# Patient Record
Sex: Female | Born: 1980 | Race: White | Hispanic: No | Smoking: Current every day smoker
Health system: Southern US, Community
[De-identification: ages and names within clinical notes are randomized; demographics above are authoritative.]

## PROBLEM LIST (undated history)

## (undated) ENCOUNTER — Inpatient Hospital Stay (HOSPITAL_COMMUNITY): Payer: Self-pay

## (undated) DIAGNOSIS — I493 Ventricular premature depolarization: Secondary | ICD-10-CM

## (undated) DIAGNOSIS — F99 Mental disorder, not otherwise specified: Secondary | ICD-10-CM

## (undated) DIAGNOSIS — R002 Palpitations: Secondary | ICD-10-CM

## (undated) DIAGNOSIS — E039 Hypothyroidism, unspecified: Secondary | ICD-10-CM

## (undated) DIAGNOSIS — N926 Irregular menstruation, unspecified: Secondary | ICD-10-CM

## (undated) DIAGNOSIS — R079 Chest pain, unspecified: Secondary | ICD-10-CM

## (undated) DIAGNOSIS — I38 Endocarditis, valve unspecified: Secondary | ICD-10-CM

## (undated) DIAGNOSIS — B009 Herpesviral infection, unspecified: Secondary | ICD-10-CM

## (undated) HISTORY — DX: Palpitations: R00.2

## (undated) HISTORY — DX: Chest pain, unspecified: R07.9

## (undated) HISTORY — DX: Irregular menstruation, unspecified: N92.6

## (undated) HISTORY — DX: Mental disorder, not otherwise specified: F99

## (undated) HISTORY — DX: Ventricular premature depolarization: I49.3

## (undated) HISTORY — PX: WISDOM TOOTH EXTRACTION: SHX21

## (undated) HISTORY — DX: Herpesviral infection, unspecified: B00.9

---

## 2012-07-01 ENCOUNTER — Emergency Department (HOSPITAL_COMMUNITY): Payer: Self-pay

## 2012-07-01 ENCOUNTER — Emergency Department (HOSPITAL_COMMUNITY)
Admission: EM | Admit: 2012-07-01 | Discharge: 2012-07-01 | Disposition: A | Discharge status: Home / Self Care | Payer: Self-pay | Attending: Emergency Medicine | Admitting: Emergency Medicine

## 2012-07-01 ENCOUNTER — Other Ambulatory Visit: Payer: Self-pay

## 2012-07-01 ENCOUNTER — Encounter (HOSPITAL_COMMUNITY): Payer: Self-pay

## 2012-07-01 DIAGNOSIS — R001 Bradycardia, unspecified: Secondary | ICD-10-CM

## 2012-07-01 DIAGNOSIS — Z79899 Other long term (current) drug therapy: Secondary | ICD-10-CM | POA: Insufficient documentation

## 2012-07-01 DIAGNOSIS — E119 Type 2 diabetes mellitus without complications: Secondary | ICD-10-CM | POA: Insufficient documentation

## 2012-07-01 DIAGNOSIS — I498 Other specified cardiac arrhythmias: Secondary | ICD-10-CM | POA: Insufficient documentation

## 2012-07-01 DIAGNOSIS — E079 Disorder of thyroid, unspecified: Secondary | ICD-10-CM | POA: Insufficient documentation

## 2012-07-01 DIAGNOSIS — I951 Orthostatic hypotension: Secondary | ICD-10-CM | POA: Insufficient documentation

## 2012-07-01 LAB — COMPREHENSIVE METABOLIC PANEL
ALT: 10 U/L (ref 0–35)
AST: 13 U/L (ref 0–37)
Albumin: 3.5 g/dL (ref 3.5–5.2)
Alkaline Phosphatase: 40 U/L (ref 39–117)
CO2: 26 mEq/L (ref 19–32)
Chloride: 105 mEq/L (ref 96–112)
GFR calc non Af Amer: >90 mL/min (ref >90)
Potassium: 3.9 mEq/L (ref 3.5–5.1)
Sodium: 138 mEq/L (ref 135–145)
Total Bilirubin: 0.2 mg/dL — ABNORMAL LOW (ref 0.3–1.2)

## 2012-07-01 LAB — URINALYSIS, ROUTINE W REFLEX MICROSCOPIC
Glucose, UA: NEGATIVE mg/dL
Hgb urine dipstick: NEGATIVE
Leukocytes, UA: NEGATIVE
Protein, ur: NEGATIVE mg/dL
Specific Gravity, Urine: <1.005 — ABNORMAL LOW (ref 1.005–1.030)

## 2012-07-01 LAB — CBC WITH DIFFERENTIAL/PLATELET
Basophils Absolute: 0 10*3/uL (ref 0.0–0.1)
Basophils Relative: 0 % (ref 0–1)
HCT: 34 % — ABNORMAL LOW (ref 36.0–46.0)
Lymphocytes Relative: 33 % (ref 12–46)
Monocytes Absolute: 0.4 10*3/uL (ref 0.1–1.0)
Neutro Abs: 4.1 10*3/uL (ref 1.7–7.7)
Neutrophils Relative %: 60 % (ref 43–77)
RDW: 13.2 % (ref 11.5–15.5)
WBC: 6.8 10*3/uL (ref 4.0–10.5)

## 2012-07-01 LAB — PREGNANCY, URINE: Preg Test, Ur: NEGATIVE

## 2012-07-01 MED ORDER — PROMETHAZINE HCL 25 MG PO TABS: 25.0000 mg | ORAL_TABLET | Freq: Four times a day (QID) | ORAL | Status: DC | PRN

## 2012-07-01 MED ORDER — SODIUM CHLORIDE 0.9 % IV SOLN: 1000.0000 mL | INTRAVENOUS | Status: DC

## 2012-07-01 MED ORDER — SODIUM CHLORIDE 0.9 % IV SOLN
1000.0000 mL | Freq: Once | INTRAVENOUS | Status: AC
  Administered 2012-07-01: 1000 mL via INTRAVENOUS

## 2012-07-01 NOTE — ED Notes (Addendum)
Pt states she has been more tired than usual and she thought she was going to pass out. Pt states her "chest felt weird". Pt was seen at health dept and had EKG done and was then told to see a cardiologist. Pt denies CP and SOB.

## 2012-07-01 NOTE — ED Provider Notes (Signed)
History     CSN: [id number]  Arrival date & time 07/01/12  1704   First MD Initiated Contact with Patient 07/01/12 1718      Chief Complaint  Patient presents with  . Bradycardia    (Consider location/radiation/quality/duration/timing/severity/associated sxs/prior treatment) HPI  Patient reports for the past month she's been feeling tired, weak and dizzy. She states the dizziness is not spinning but feeling she might pass out. 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 She states her chest felt "weird" and states it was being irregularly. She continues to feel tired. She states the symptoms lasted about 45 minutes. She denies chest pain, shortness of breath, diaphoresis, spinning feeling, headache, nausea, or vomiting. She states her symptoms are worse when she stands up quickly, nothing makes it feel better. She states she has had a normal appetite. She was seen today at the health department and was told to come to the ED or be referred to a cardiologist. They said lab work from August 1 showed her A1c is 6, her TSH was 4.239.  Patient reports her last normal period was about a month ago she was recently swapped on her birth control pills, she states she did have some spotting.  PCP [facility]  Past Medical History  Diagnosis Date  . Thyroid disease since age 36   . Diabetes mellitus about 2 years     History reviewed. No pertinent past surgical history.  No family history on file. Maternal grandmother had coronary artery disease and died at age 29 One maternal aunt died at age 20 from congestive heart failure Another maternal aunt died of congestive heart failure at age 16 Thyroid disease runs in the family Parents are fine  History  Substance Use Topics  . Smoking status: Never Smoker   . Smokeless  tobacco: Not on file  . Alcohol Use: No   employed  OB History    Grav Para Term Preterm Abortions TAB SAB Ect Mult Living                  Review of Systems  All other systems reviewed and are negative.    Allergies  Review of patient's allergies indicates no known allergies.  Home Medications   Current Outpatient Rx  Name Route Sig Dispense Refill  . OMEGA-3 FATTY ACIDS 1000 MG PO CAPS Oral Take 1 g by mouth 2 (two) times daily.    Marland Kitchen LEVONORGEST-ETH ESTRAD 91-DAY 0.15-0.03 MG PO TABS Oral Take 1 tablet by mouth daily.    Marland Kitchen LEVOTHYROXINE SODIUM 50 MCG PO TABS Oral Take 50 mcg by mouth daily.    Marland Kitchen METFORMIN HCL ER 500 MG PO TB24 Oral Take 500 mg by mouth daily.    . ADULT MULTIVITAMIN W/MINERALS CH Oral Take 1 tablet by mouth daily.      BP 108/49  Pulse 59  Temp 98.8 F (37.1 C) (Oral)  Resp 18  Ht 5\' 8"  (1.727 m)  Wt 188 lb (85.276 kg)  BMI 28.59 kg/m2  SpO2 100%  Vital signs normal   Orthostatic vital signs are positive for hypertension and mild bradycardia to 53  Physical Exam  Nursing note and vitals reviewed. Constitutional: She is oriented to person, place, and time. She appears well-developed and well-nourished.  Non-toxic appearance. She does  not appear ill. No distress.  HENT:  Head: Normocephalic and atraumatic.  Right Ear: External ear normal.  Left Ear: External ear normal.  Nose: Nose normal. No mucosal edema or rhinorrhea.  Mouth/Throat: Oropharynx is clear and moist and mucous membranes are normal. No dental abscesses or uvula swelling.  Eyes: Conjunctivae normal and EOM are normal. Pupils are equal, round, and reactive to light.  Neck: Normal range of motion and full passive range of motion without pain. Neck supple.  Cardiovascular: Normal rate, regular rhythm and normal heart sounds.  Exam reveals no gallop and no friction rub.   No murmur heard. Pulmonary/Chest: Effort normal and breath sounds normal. No respiratory distress. She has no  wheezes. She has no rhonchi. She has no rales. She exhibits no tenderness and no crepitus.  Abdominal: Soft. Normal appearance and bowel sounds are normal. She exhibits no distension. There is no tenderness. There is no rebound and no guarding.  Musculoskeletal: Normal range of motion. She exhibits no edema and no tenderness.       Moves all extremities well.   Neurological: She is alert and oriented to person, place, and time. She has normal strength. No cranial nerve deficit.  Skin: Skin is warm, dry and intact. No rash noted. No erythema. No pallor.  Psychiatric: She has a normal mood and affect. Her speech is normal and behavior is normal. Her mood appears not anxious.    ED Course  Procedures (including critical care time)  Medications  0.9 %  sodium chloride infusion (1000 mL Intravenous New Bag/Given 07/01/12 1954)    Followed by  0.9 %  sodium chloride infusion (not administered)  promethazine (PHENERGAN) 25 MG tablet (not administered)   19:30 pt given test results and need for IV fluids.  21:15 Pt has only gotten 200 cc of her bolus, she has her arm bent and IV isn't running, pt shown how to keep her arm extended so she can finish her bolus of fluids.   Pt received her bolus of fluids. Orthostatic hypotension has improved.   Results for orders placed during the hospital encounter of 07/01/12  CBC WITH DIFFERENTIAL      Component Value Range   WBC 6.8  4.0 - 10.5 K/uL   RBC 3.76 (*) 3.87 - 5.11 MIL/uL   Hemoglobin 11.5 (*) 12.0 - 15.0 g/dL   HCT 96.0 (*) 45.4 - 09.8 %   MCV 90.4  78.0 - 100.0 fL   MCH 30.6  26.0 - 34.0 pg   MCHC 33.8  30.0 - 36.0 g/dL   RDW 11.9  14.7 - 82.9 %   Platelets 266  150 - 400 K/uL   Neutrophils Relative 60  43 - 77 %   Neutro Abs 4.1  1.7 - 7.7 K/uL   Lymphocytes Relative 33  12 - 46 %   Lymphs Abs 2.3  0.7 - 4.0 K/uL   Monocytes Relative 6  3 - 12 %   Monocytes Absolute 0.4  0.1 - 1.0 K/uL   Eosinophils Relative 1  0 - 5 %   Eosinophils  Absolute 0.1  0.0 - 0.7 K/uL   Basophils Relative 0  0 - 1 %   Basophils Absolute 0.0  0.0 - 0.1 K/uL  COMPREHENSIVE METABOLIC PANEL      Component Value Range   Sodium 138  135 - 145 mEq/L   Potassium 3.9  3.5 - 5.1 mEq/L   Chloride 105  96 - 112 mEq/L   CO2  26  19 - 32 mEq/L   Glucose, Bld 103 (*) 70 - 99 mg/dL   BUN 11  6 - 23 mg/dL   Creatinine, Ser 1.61  0.50 - 1.10 mg/dL   Calcium 9.0  8.4 - 09.6 mg/dL   Total Protein 6.9  6.0 - 8.3 g/dL   Albumin 3.5  3.5 - 5.2 g/dL   AST 13  0 - 37 U/L   ALT 10  0 - 35 U/L   Alkaline Phosphatase 40  39 - 117 U/L   Total Bilirubin 0.2 (*) 0.3 - 1.2 mg/dL   GFR calc non Af Amer >90  >90 mL/min   GFR calc Af Amer >90  >90 mL/min  TROPONIN I      Component Value Range   Troponin I <0.30  <0.30 ng/mL  MONONUCLEOSIS SCREEN      Component Value Range   Mono Screen NEGATIVE  NEGATIVE  URINALYSIS, ROUTINE W REFLEX MICROSCOPIC      Component Value Range   Color, Urine YELLOW  YELLOW   APPearance CLEAR  CLEAR   Specific Gravity, Urine <1.005 (*) 1.005 - 1.030   pH 5.0  5.0 - 8.0   Glucose, UA NEGATIVE  NEGATIVE mg/dL   Hgb urine dipstick NEGATIVE  NEGATIVE   Bilirubin Urine NEGATIVE  NEGATIVE   Ketones, ur NEGATIVE  NEGATIVE mg/dL   Protein, ur NEGATIVE  NEGATIVE mg/dL   Urobilinogen, UA 0.2  0.0 - 1.0 mg/dL   Nitrite NEGATIVE  NEGATIVE   Leukocytes, UA NEGATIVE  NEGATIVE  PREGNANCY, URINE      Component Value Range   Preg Test, Ur NEGATIVE  NEGATIVE   Laboratory interpretation all normal except mild anemia  Dg Chest 2 View  07/01/2012  *RADIOLOGY REPORT*  Clinical Data: 31 year old female bradycardia, weakness.  CHEST - 2 VIEW  Comparison: 12/11/2009.  Findings: Lung volumes within normal limits.  Cardiac size and mediastinal contours are within normal limits.  Visualized tracheal air column is within normal limits.  The lungs are clear.  No pneumothorax or effusion. No acute osseous abnormality identified.  IMPRESSION: Negative, no  acute cardiopulmonary abnormality.   Original Report Authenticated By: Harley Hallmark, M.D.      Date: 07/01/2012  Rate: 62  Rhythm: sinus bradycardia and premature ventricular contractions (PVC)  QRS Axis: normal  Intervals: normal  ST/T Wave abnormalities: normal  Conduction Disutrbances:none  Narrative Interpretation:   Old EKG Reviewed: none available    1. Orthostatic hypotension   2. Bradycardia     New Prescriptions   PROMETHAZINE (PHENERGAN) 25 MG TABLET    Take 1 tablet (25 mg total) by mouth every 6 (six) hours as needed for nausea.    Plan discharge  Devoria Albe, MD, FACEP   MDM          Ward Givens, MD 07/01/12 2207

## 2012-07-01 NOTE — ED Notes (Signed)
Pt reports for the past month has felt tired, has had intermittent chest pain, and feeling light headed.  [facility] to [facility] and had ekg.   Was instructed to see a cardiologist but pt doesn't have insurance.

## 2012-07-14 ENCOUNTER — Emergency Department (HOSPITAL_COMMUNITY)
Admission: EM | Admit: 2012-07-14 | Discharge: 2012-07-14 | Disposition: A | Discharge status: Home / Self Care | Payer: Self-pay | Attending: Emergency Medicine | Admitting: Emergency Medicine

## 2012-07-14 ENCOUNTER — Encounter (HOSPITAL_COMMUNITY): Payer: Self-pay | Admitting: *Deleted

## 2012-07-14 DIAGNOSIS — E039 Hypothyroidism, unspecified: Secondary | ICD-10-CM | POA: Insufficient documentation

## 2012-07-14 DIAGNOSIS — I498 Other specified cardiac arrhythmias: Secondary | ICD-10-CM | POA: Insufficient documentation

## 2012-07-14 DIAGNOSIS — R55 Syncope and collapse: Secondary | ICD-10-CM

## 2012-07-14 DIAGNOSIS — E119 Type 2 diabetes mellitus without complications: Secondary | ICD-10-CM | POA: Insufficient documentation

## 2012-07-14 DIAGNOSIS — I4949 Other premature depolarization: Secondary | ICD-10-CM | POA: Insufficient documentation

## 2012-07-14 DIAGNOSIS — I493 Ventricular premature depolarization: Secondary | ICD-10-CM

## 2012-07-14 LAB — BASIC METABOLIC PANEL
CO2: 23 mEq/L (ref 19–32)
Chloride: 105 mEq/L (ref 96–112)
GFR calc Af Amer: >90 mL/min (ref >90)
Potassium: 3.3 mEq/L — ABNORMAL LOW (ref 3.5–5.1)

## 2012-07-14 LAB — CBC WITH DIFFERENTIAL/PLATELET
Basophils Absolute: 0 10*3/uL (ref 0.0–0.1)
Basophils Relative: 1 % (ref 0–1)
HCT: 33.6 % — ABNORMAL LOW (ref 36.0–46.0)
Hemoglobin: 11.1 g/dL — ABNORMAL LOW (ref 12.0–15.0)
Lymphocytes Relative: 40 % (ref 12–46)
MCHC: 33 g/dL (ref 30.0–36.0)
Monocytes Absolute: 0.4 10*3/uL (ref 0.1–1.0)
Monocytes Relative: 7 % (ref 3–12)
Neutro Abs: 3.3 10*3/uL (ref 1.7–7.7)
Neutrophils Relative %: 52 % (ref 43–77)
RDW: 12.8 % (ref 11.5–15.5)
WBC: 6.2 10*3/uL (ref 4.0–10.5)

## 2012-07-14 LAB — URINALYSIS, ROUTINE W REFLEX MICROSCOPIC
Glucose, UA: NEGATIVE mg/dL
Nitrite: NEGATIVE
Specific Gravity, Urine: <1.005 — ABNORMAL LOW (ref 1.005–1.030)
pH: 6 (ref 5.0–8.0)

## 2012-07-14 LAB — URINE MICROSCOPIC-ADD ON

## 2012-07-14 MED ORDER — SODIUM CHLORIDE 0.9 % IV BOLUS (SEPSIS)
1000.0000 mL | Freq: Once | INTRAVENOUS | Status: AC
  Administered 2012-07-14: 1000 mL via INTRAVENOUS

## 2012-07-14 NOTE — ED Provider Notes (Signed)
History   Scribed for [employer]. Bernette Mayers, MD, the patient was seen in room APA05/APA05 . This chart was scribed by Lewanda Rife.   CSN: [id number]  Arrival date & time 07/14/12  1508   First MD Initiated Contact with Patient 07/14/12 1533      Chief Complaint  Patient presents with  . Irregular Heart Beat    (Consider location/radiation/quality/duration/timing/severity/associated sxs/prior treatment) HPI Miranda [name] is a 31 y.o. female who presents to the Emergency Department complaining of an irregular heart beat today since work earlier today. Pt reports feeling lightheaded and like she was going to "pass out" but never lost consciousness Pt reports having her thyroid levels and A1C checked recently [facility], which came back normal. Pt denies fever, nausea, emesis, vaginal bleeding, and rectal bleeding. Pt was seen in the ED for same about a week ago, had PVC but otherwise normal labs. Was given IVF for orthostatic vitals and felt better. Pt reports she has an appointment with a cardiologist at [facility] coming up in a few days. Additionally, pt feels like she has urinary tract infection. Pt denies smoking. Pt denies taking any medications to treat symptoms.   Past Medical History  Diagnosis Date  . Thyroid disease   . Diabetes mellitus     History reviewed. No pertinent past surgical history.  History reviewed. No pertinent family history.  History  Substance Use Topics  . Smoking status: Never Smoker   . Smokeless tobacco: Not on file  . Alcohol Use: No    OB History    Grav Para Term Preterm Abortions TAB SAB Ect Mult Living                  Review of Systems A complete 10 system review of systems was obtained and all systems are negative except as noted in the HPI and PMH.   Allergies  Review of patient's allergies indicates no known allergies.  Home Medications   Current Outpatient Rx  Name Route Sig Dispense Refill  . OMEGA-3 FATTY ACIDS 1000 MG  PO CAPS Oral Take 1 g by mouth 2 (two) times daily.    Marland Kitchen LEVONORGEST-ETH ESTRAD 91-DAY 0.15-0.03 MG PO TABS Oral Take 1 tablet by mouth daily.    Marland Kitchen LEVOTHYROXINE SODIUM 50 MCG PO TABS Oral Take 50 mcg by mouth daily.    Marland Kitchen METFORMIN HCL ER 500 MG PO TB24 Oral Take 500 mg by mouth daily.    . ADULT MULTIVITAMIN W/MINERALS CH Oral Take 1 tablet by mouth daily.    Marland Kitchen CRANBERRY URINARY COMFORT PO Oral Take 2 tablets by mouth 3 (three) times daily after meals.      BP 113/75  Pulse 57  Temp 99.3 F (37.4 C) (Oral)  Resp 18  Ht 5\' 8"  (1.727 m)  Wt 188 lb (85.276 kg)  BMI 28.59 kg/m2  SpO2 100%  LMP 06/10/2012  Physical Exam  Nursing note and vitals reviewed. Constitutional: She is oriented to person, place, and time. She appears well-developed and well-nourished.  HENT:  Head: Normocephalic and atraumatic.  Eyes: EOM are normal. Pupils are equal, round, and reactive to light.  Neck: Normal range of motion. Neck supple.  Cardiovascular: Normal rate, normal heart sounds and intact distal pulses.   Pulmonary/Chest: Effort normal and breath sounds normal.  Abdominal: Bowel sounds are normal. She exhibits no distension. There is no tenderness.  Musculoskeletal: Normal range of motion. She exhibits no edema and no tenderness.  Neurological: She is  alert and oriented to person, place, and time. She has normal strength. No cranial nerve deficit or sensory deficit.  Skin: Skin is warm and dry. No rash noted.  Psychiatric: She has a normal mood and affect.    ED Course  Procedures (including critical care time)  Labs Reviewed  CBC WITH DIFFERENTIAL - Abnormal; Notable for the following:    RBC 3.75 (*)     Hemoglobin 11.1 (*)     HCT 33.6 (*)     All other components within normal limits  BASIC METABOLIC PANEL - Abnormal; Notable for the following:    Potassium 3.3 (*)     All other components within normal limits  URINALYSIS, ROUTINE W REFLEX MICROSCOPIC - Abnormal; Notable for the  following:    Specific Gravity, Urine <1.005 (*)     Leukocytes, UA SMALL (*)     All other components within normal limits  URINE MICROSCOPIC-ADD ON - Abnormal; Notable for the following:    Squamous Epithelial / LPF MANY (*)     Bacteria, UA FEW (*)     All other components within normal limits  PREGNANCY, URINE   No results found.   No diagnosis found.    MDM   Date: 07/14/2012  Rate: 57  Rhythm: sinus bradycardia and premature ventricular contractions (PVC)  QRS Axis: normal  Intervals: normal  ST/T Wave abnormalities: normal  Conduction Disutrbances:none  Narrative Interpretation:   Old EKG Reviewed: unchanged    5:39 PM Pt feeling better, labs unchanged from previous. I spoke with [facility] clinic, but no openings prior to her visit on 10/11. Pt advised to follow up with Cards for further eval. No significant abnormalities here except for occasional PVCs.     I personally performed the services described in the documentation, which were scribed in my presence. The recorded information has been reviewed and considered.     Charles B. Bernette Mayers, MD 07/14/12 1740

## 2012-07-14 NOTE — ED Notes (Addendum)
Onset 1 hour ago at work, sob, chest felt "weird"  And felt faint.  Says she thinks she has a uti also, taking otc meds for sx.

## 2012-07-24 ENCOUNTER — Ambulatory Visit (HOSPITAL_COMMUNITY)
Admission: RE | Admit: 2012-07-24 | Discharge: 2012-07-24 | Disposition: A | Discharge status: Home / Self Care | Payer: Self-pay | Source: Ambulatory Visit | Attending: Internal Medicine | Admitting: Internal Medicine

## 2012-07-24 ENCOUNTER — Other Ambulatory Visit (HOSPITAL_COMMUNITY): Payer: Self-pay

## 2012-07-24 DIAGNOSIS — I493 Ventricular premature depolarization: Secondary | ICD-10-CM

## 2012-07-24 DIAGNOSIS — R079 Chest pain, unspecified: Secondary | ICD-10-CM

## 2012-07-24 DIAGNOSIS — R002 Palpitations: Secondary | ICD-10-CM | POA: Insufficient documentation

## 2012-07-24 HISTORY — DX: Chest pain, unspecified: R07.9

## 2012-07-24 HISTORY — DX: Ventricular premature depolarization: I49.3

## 2012-07-24 NOTE — CV Procedure (Signed)
Miranda [name] presented today for her treadmill stress test. She exercised under my supervision on a treadmill for 11 minutes and 30 seconds. THR of 159 was achieved. Baseline EKG showed sinus rhythm with no ischemic changes. There were isolated PVC's at rest and during exercise which diminished with exercise. No exercise induced ischemic EKG changes were noted. Blood pressure response was normal. This is a normal Bruce treadmill stress test predicting a low likelihood of ischemic events.  Chrystie Nose, MD, [facility] Attending Cardiologist [facility]

## 2012-07-24 NOTE — Progress Notes (Signed)
Stress Lab Nurses Notes - Jeani Hawking  Rayne Cowdrey 07/24/2012 Reason for doing test: Palpitation & presyncope Type of test: Regular GTX Nurse performing test: Parke Poisson, RN Nuclear Medicine Tech: Not Applicable Echo Tech: Not Applicable MD performing test: Ellene Route Family MD: [facility] Test explained and consent signed: yes IV started: No IV started Symptoms: light SOB & fatigue Treatment/Intervention: None Reason test stopped: fatigue After recovery IV was: None Patient to return to Nuc. Med at : NA Patient discharged: Home Patient's Condition upon discharge was: stable Comments: During test peak BP 178/98 & HR 162.  Recovery BP 132/82 & HR 68.  Symptoms resolved in recovery. Erskine Speed T

## 2012-07-24 NOTE — Progress Notes (Signed)
*  PRELIMINARY RESULTS* Echocardiogram 2D Echocardiogram has been performed.  Conrad Bonduel 07/24/2012, 2:10 PM

## 2012-10-08 NOTE — L&D Delivery Note (Signed)
Operative Delivery Note At 9:09 AM a viable female was delivered via vacuum assist.  Presentation: vertex; Position: Occiput,, Anterior; Station: +3.  Verbal consent: obtained from patient.  Risks and benefits discussed in detail.  Risks include, but are not limited to the risks of bleeding, infection, damage to maternal tissues, fetal cephalhematoma.  There is also the risk of inability to effect vaginal delivery of the head, or shoulder dystocia that cannot be resolved by established maneuvers, leading to the need for emergency cesarean section.  APGAR: Pending, infant crying on maternal abdomen,; weight .   Placenta status: Intact,  Cord: 3V with the following complications: .  Cord pH: NA  Anesthesia:  Epidural already in place Instruments: Kiwi vacuum Episiotomy:  NA Lacerations:  Bilateral hemostatic labial, sulcal tear Suture Repair: 3.0 vicryl on CT Est. Blood Loss (mL):   Mom to postpartum.  Baby to maternal chest.  Mother had been pushing for 3 hrs and was concerned for maternal exhaustion. Discussed risks and benefits and agreed on trial of Vacuum prior to c-section. Kiwi placed on occiput, 3 pulls with one set of contraction. Infant head delivered easily. Vacuum removed once chin past perineum. Mother continued to push with easy delivery of shoulders. Baby delivered to maternal abdomen. Vaginal inspection showed intact perineum, bil labial tears hemostatic not requiring repair. Small sulcal tear bleeding, repaired with 3.0 vicrul on CT with 3 locking stitches. Hemostatic. EBL 300cc. Counts correct, sharps off field. VAVD over intact perineum.  Ramello Cordial RYAN 07/26/2013, 9:30 AM

## 2012-12-03 ENCOUNTER — Emergency Department (HOSPITAL_COMMUNITY)
Admission: EM | Admit: 2012-12-03 | Discharge: 2012-12-03 | Disposition: A | Discharge status: Home / Self Care | Payer: Medicaid Other | Attending: Emergency Medicine | Admitting: Emergency Medicine

## 2012-12-03 ENCOUNTER — Encounter (HOSPITAL_COMMUNITY): Payer: Self-pay | Admitting: Emergency Medicine

## 2012-12-03 DIAGNOSIS — R109 Unspecified abdominal pain: Secondary | ICD-10-CM | POA: Insufficient documentation

## 2012-12-03 DIAGNOSIS — N390 Urinary tract infection, site not specified: Secondary | ICD-10-CM | POA: Insufficient documentation

## 2012-12-03 DIAGNOSIS — R110 Nausea: Secondary | ICD-10-CM | POA: Insufficient documentation

## 2012-12-03 DIAGNOSIS — R3915 Urgency of urination: Secondary | ICD-10-CM | POA: Insufficient documentation

## 2012-12-03 DIAGNOSIS — R350 Frequency of micturition: Secondary | ICD-10-CM | POA: Insufficient documentation

## 2012-12-03 DIAGNOSIS — Z3201 Encounter for pregnancy test, result positive: Secondary | ICD-10-CM | POA: Insufficient documentation

## 2012-12-03 DIAGNOSIS — E119 Type 2 diabetes mellitus without complications: Secondary | ICD-10-CM | POA: Insufficient documentation

## 2012-12-03 DIAGNOSIS — O2341 Unspecified infection of urinary tract in pregnancy, first trimester: Secondary | ICD-10-CM

## 2012-12-03 DIAGNOSIS — Z79899 Other long term (current) drug therapy: Secondary | ICD-10-CM | POA: Insufficient documentation

## 2012-12-03 DIAGNOSIS — Z331 Pregnant state, incidental: Secondary | ICD-10-CM | POA: Insufficient documentation

## 2012-12-03 DIAGNOSIS — R6883 Chills (without fever): Secondary | ICD-10-CM | POA: Insufficient documentation

## 2012-12-03 DIAGNOSIS — K5900 Constipation, unspecified: Secondary | ICD-10-CM | POA: Insufficient documentation

## 2012-12-03 DIAGNOSIS — E079 Disorder of thyroid, unspecified: Secondary | ICD-10-CM | POA: Insufficient documentation

## 2012-12-03 LAB — URINALYSIS, ROUTINE W REFLEX MICROSCOPIC
Glucose, UA: NEGATIVE mg/dL
Leukocytes, UA: NEGATIVE
Nitrite: POSITIVE — AB
Specific Gravity, Urine: <1.005 — ABNORMAL LOW (ref 1.005–1.030)
pH: 6 (ref 5.0–8.0)

## 2012-12-03 LAB — POCT PREGNANCY, URINE: Preg Test, Ur: POSITIVE — AB

## 2012-12-03 MED ORDER — CEPHALEXIN 500 MG PO CAPS: 500.0000 mg | ORAL_CAPSULE | Freq: Four times a day (QID) | ORAL | Status: DC

## 2012-12-03 MED ORDER — CEPHALEXIN 500 MG PO CAPS
ORAL_CAPSULE | ORAL | Status: AC
  Filled 2012-12-03: qty 1

## 2012-12-03 MED ORDER — ONDANSETRON 8 MG PO TBDP
8.0000 mg | ORAL_TABLET | Freq: Once | ORAL | Status: AC
  Administered 2012-12-03: 8 mg via ORAL
  Filled 2012-12-03: qty 1

## 2012-12-03 MED ORDER — CEPHALEXIN 500 MG PO CAPS
500.0000 mg | ORAL_CAPSULE | Freq: Once | ORAL | Status: AC
  Administered 2012-12-03: 500 mg via ORAL

## 2012-12-03 MED ORDER — CEPHALEXIN 250 MG PO CAPS
250.0000 mg | ORAL_CAPSULE | Freq: Once | ORAL | Status: DC
  Filled 2012-12-03: qty 1

## 2012-12-03 NOTE — ED Provider Notes (Signed)
Medical screening examination/treatment/procedure(s) were performed by non-physician practitioner and as supervising physician I was immediately available for consultation/collaboration.   Gavin Pound. Dacie Mandel, MD 12/03/12 2956

## 2012-12-03 NOTE — ED Notes (Signed)
Pt c/o dysuria and polyuria x1 week.

## 2012-12-03 NOTE — ED Provider Notes (Signed)
History     CSN: [id number]  Arrival date & time 12/03/12  2127   First MD Initiated Contact with Patient 12/03/12 2204      Chief Complaint  Patient presents with  . Dysuria    (Consider location/radiation/quality/duration/timing/severity/associated sxs/prior Treatment)  HPI Miranda [name] is a 32 y.o. female who presents to the ED with UTI symptoms. Onset 1 week ago. Symptoms include frequent urination, urgency, dysuria and nausea. Has had UTI's in the past and this feels similar. Has been taking AZO over the counter without relief. Denies vaginal discharge or bleeding. Unsure LMP, thinks 2 months ago. No birth control. Denies abdominal pain.  The history was provided by the patient.   Past Medical History  Diagnosis Date  . Thyroid disease   . Diabetes mellitus     History reviewed. No pertinent past surgical history.  History reviewed. No pertinent family history.  History  Substance Use Topics  . Smoking status: Never Smoker   . Smokeless tobacco: Not on file  . Alcohol Use: No    OB History   Grav Para Term Preterm Abortions TAB SAB Ect Mult Living                  Review of Systems  Constitutional: Positive for chills. Negative for fever, diaphoresis and fatigue.  HENT: Negative for ear pain, congestion, sore throat, facial swelling, neck pain, neck stiffness, dental problem and sinus pressure.   Eyes: Negative for photophobia, pain and discharge.  Respiratory: Negative for cough, chest tightness and wheezing.   Cardiovascular: Negative for chest pain and palpitations.  Gastrointestinal: Positive for nausea, abdominal pain and constipation. Negative for vomiting, diarrhea and abdominal distention.  Genitourinary: Positive for dysuria, urgency and frequency. Negative for flank pain, vaginal bleeding, vaginal discharge, difficulty urinating and vaginal pain.  Musculoskeletal: Negative for myalgias, back pain and gait problem.  Skin: Negative for color change and  rash.  Allergic/Immunologic: Negative for immunocompromised state.  Neurological: Negative for dizziness, speech difficulty, weakness, light-headedness, numbness and headaches.  Psychiatric/Behavioral: Negative for confusion and agitation. The patient is not nervous/anxious.     Allergies  Review of patient's allergies indicates no known allergies.  Home Medications   Current Outpatient Rx  Name  Route  Sig  Dispense  Refill  . levothyroxine (SYNTHROID, LEVOTHROID) 50 MCG tablet   Oral   Take 50 mcg by mouth daily.         . metFORMIN (GLUCOPHAGE-XR) 500 MG 24 hr tablet   Oral   Take 500 mg by mouth daily.         . Multiple Vitamin (MULTIVITAMIN WITH MINERALS) TABS   Oral   Take 1 tablet by mouth daily.           BP 131/82  Pulse 72  Temp(Src) 98 F (36.7 C) (Oral)  Resp 18  SpO2 100%  Physical Exam  Nursing note and vitals reviewed. Constitutional: She is oriented to person, place, and time. She appears well-developed and well-nourished.  HENT:  Head: Normocephalic and atraumatic.  Eyes: EOM are normal. Pupils are equal, round, and reactive to light.  Neck: Neck supple.  Cardiovascular: Normal rate.   Pulmonary/Chest: Effort normal. No respiratory distress.  Abdominal: Soft. There is no tenderness. There is no CVA tenderness.  Genitourinary:  Patient declined pelvic exam.  Musculoskeletal: Normal range of motion. She exhibits no edema.  Neurological: She is alert and oriented to person, place, and time. No cranial nerve deficit.  Skin: Skin  is warm and dry.  Psychiatric: She has a normal mood and affect. Her behavior is normal. Judgment and thought content normal.    ED Course  Procedures  Results for orders placed during the hospital encounter of 12/03/12 (from the past 24 hour(s))  URINALYSIS, ROUTINE W REFLEX MICROSCOPIC     Status: Abnormal   Collection Time    12/03/12 10:31 PM      Result Value Range   Color, Urine ORANGE (*) YELLOW    APPearance CLEAR  CLEAR   Specific Gravity, Urine <1.005 (*) 1.005 - 1.030   pH 6.0  5.0 - 8.0   Glucose, UA NEGATIVE  NEGATIVE mg/dL   Hgb urine dipstick NEGATIVE  NEGATIVE   Bilirubin Urine NEGATIVE  NEGATIVE   Ketones, ur NEGATIVE  NEGATIVE mg/dL   Protein, ur NEGATIVE  NEGATIVE mg/dL   Urobilinogen, UA 1.0  0.0 - 1.0 mg/dL   Nitrite POSITIVE (*) NEGATIVE   Leukocytes, UA NEGATIVE  NEGATIVE  URINE MICROSCOPIC-ADD ON     Status: None   Collection Time    12/03/12 10:31 PM      Result Value Range   Bacteria, UA RARE  RARE  POCT PREGNANCY, URINE     Status: Abnormal   Collection Time    12/03/12 10:39 PM      Result Value Range   Preg Test, Ur POSITIVE (*) NEGATIVE     Assessment: 32 y.o. female with UTI in pregnancy  Plan:  Treat UTI   Start prenatal care, return as needed Discussed with the patient and all questioned fully answered.    Medication List    TAKE these medications       cephALEXin 500 MG capsule  Commonly known as:  KEFLEX  Take 1 capsule (500 mg total) by mouth 4 (four) times daily.      ASK your doctor about these medications       levothyroxine 50 MCG tablet  Commonly known as:  SYNTHROID, LEVOTHROID  Take 50 mcg by mouth daily.     metFORMIN 500 MG 24 hr tablet  Commonly known as:  GLUCOPHAGE-XR  Take 500 mg by mouth daily.     multivitamin with minerals Tabs  Take 1 tablet by mouth daily.           [place], [place] 12/03/12 [phone]

## 2012-12-18 ENCOUNTER — Other Ambulatory Visit: Payer: Self-pay | Admitting: Adult Health

## 2012-12-18 LAB — OB RESULTS CONSOLE RPR
RPR: NONREACTIVE
RPR: NONREACTIVE

## 2012-12-18 LAB — OB RESULTS CONSOLE HGB/HCT, BLOOD
HCT: 34 %
Hemoglobin: 11.6 g/dL

## 2012-12-18 LAB — OB RESULTS CONSOLE TSH: TSH: 5.67

## 2012-12-18 LAB — OB RESULTS CONSOLE PLATELET COUNT: Platelets: 301 10*3/uL

## 2012-12-18 LAB — OB RESULTS CONSOLE ABO/RH: RH Type: POSITIVE

## 2012-12-19 ENCOUNTER — Other Ambulatory Visit: Payer: Self-pay | Admitting: Obstetrics & Gynecology

## 2012-12-19 DIAGNOSIS — Z36 Encounter for antenatal screening of mother: Secondary | ICD-10-CM

## 2012-12-24 LAB — GC/CHLAMYDIA PROBE AMP
CT Probe RNA: NEGATIVE
GC Probe RNA: NEGATIVE

## 2012-12-25 ENCOUNTER — Emergency Department (HOSPITAL_COMMUNITY)
Admission: EM | Admit: 2012-12-25 | Discharge: 2012-12-25 | Disposition: A | Discharge status: Home / Self Care | Payer: BC Managed Care – PPO | Attending: Emergency Medicine | Admitting: Emergency Medicine

## 2012-12-25 ENCOUNTER — Encounter (HOSPITAL_COMMUNITY): Payer: Self-pay | Admitting: *Deleted

## 2012-12-25 DIAGNOSIS — N390 Urinary tract infection, site not specified: Secondary | ICD-10-CM

## 2012-12-25 DIAGNOSIS — E119 Type 2 diabetes mellitus without complications: Secondary | ICD-10-CM | POA: Insufficient documentation

## 2012-12-25 DIAGNOSIS — O24919 Unspecified diabetes mellitus in pregnancy, unspecified trimester: Secondary | ICD-10-CM | POA: Insufficient documentation

## 2012-12-25 DIAGNOSIS — Z3490 Encounter for supervision of normal pregnancy, unspecified, unspecified trimester: Secondary | ICD-10-CM

## 2012-12-25 DIAGNOSIS — T361X5A Adverse effect of cephalosporins and other beta-lactam antibiotics, initial encounter: Secondary | ICD-10-CM | POA: Insufficient documentation

## 2012-12-25 DIAGNOSIS — Z8679 Personal history of other diseases of the circulatory system: Secondary | ICD-10-CM | POA: Insufficient documentation

## 2012-12-25 DIAGNOSIS — T3795XA Adverse effect of unspecified systemic anti-infective and antiparasitic, initial encounter: Secondary | ICD-10-CM

## 2012-12-25 DIAGNOSIS — O2390 Unspecified genitourinary tract infection in pregnancy, unspecified trimester: Secondary | ICD-10-CM | POA: Insufficient documentation

## 2012-12-25 DIAGNOSIS — E079 Disorder of thyroid, unspecified: Secondary | ICD-10-CM | POA: Insufficient documentation

## 2012-12-25 DIAGNOSIS — Z79899 Other long term (current) drug therapy: Secondary | ICD-10-CM | POA: Insufficient documentation

## 2012-12-25 HISTORY — DX: Endocarditis, valve unspecified: I38

## 2012-12-25 MED ORDER — NITROFURANTOIN MONOHYD MACRO 100 MG PO CAPS
100.0000 mg | ORAL_CAPSULE | Freq: Once | ORAL | Status: AC
  Administered 2012-12-25: 100 mg via ORAL
  Filled 2012-12-25: qty 1

## 2012-12-25 MED ORDER — NITROFURANTOIN MONOHYD MACRO 100 MG PO CAPS: 100.0000 mg | ORAL_CAPSULE | Freq: Two times a day (BID) | ORAL | Status: DC

## 2012-12-25 MED ORDER — ONDANSETRON HCL 4 MG PO TABS: 4.0000 mg | ORAL_TABLET | Freq: Four times a day (QID) | ORAL | Status: DC

## 2012-12-25 MED ORDER — ONDANSETRON 4 MG PO TBDP
4.0000 mg | ORAL_TABLET | Freq: Once | ORAL | Status: AC
  Administered 2012-12-25: 4 mg via ORAL
  Filled 2012-12-25: qty 1

## 2012-12-25 NOTE — ED Provider Notes (Signed)
History     CSN: [id number]  Arrival date & time 12/25/12  4098   First MD Initiated Contact with Patient 12/25/12 1934      Chief Complaint  Patient presents with  . Emesis    (Consider location/radiation/quality/duration/timing/severity/associated sxs/prior treatment) Patient is a 32 y.o. female presenting with vomiting. The history is provided by the patient and medical records. No language interpreter was used.  Emesis Severity:  Moderate Duration:  3 hours Quality:  Stomach contents (The patient is a 32 year old woman who is pregnant. She been diagnosed with a urinary tract infection, and Keflex had been called in for her. She took her first dose of Keflex this afternoon at 1, then around 4 developed severe vomiting. She therefore soug) Progression:  Improving Chronicity:  New Recent urination:  Normal Relieved by:  Nothing Worsened by:  Nothing tried Associated symptoms: no chills and no diarrhea   Risk factors: pregnant now     Past Medical History  Diagnosis Date  . Thyroid disease   . Diabetes mellitus   . Pregnant   . Heart valve problem     History reviewed. No pertinent past surgical history.  History reviewed. No pertinent family history.  History  Substance Use Topics  . Smoking status: Never Smoker   . Smokeless tobacco: Not on file  . Alcohol Use: No    OB History   Grav Para Term Preterm Abortions TAB SAB Ect Mult Living   1               Review of Systems  Constitutional: Negative.  Negative for fever and chills.  HENT: Negative.   Eyes: Negative.   Respiratory: Negative.   Cardiovascular: Negative.   Gastrointestinal: Positive for nausea and vomiting. Negative for diarrhea.  Genitourinary:       Recently diagnosed UTI.  Skin: Negative.   Neurological: Negative.   Psychiatric/Behavioral: Negative.     Allergies  Keflex  Home Medications   Current Outpatient Rx  Name  Route  Sig  Dispense  Refill  . cephALEXin (KEFLEX) 500 MG  capsule   Oral   Take 1 capsule (500 mg total) by mouth 4 (four) times daily.   28 capsule   0   . levothyroxine (SYNTHROID, LEVOTHROID) 75 MCG tablet   Oral   Take 75 mcg by mouth daily.         . metFORMIN (GLUCOPHAGE-XR) 500 MG 24 hr tablet   Oral   Take 500 mg by mouth daily.         . Prenatal Vit-Fe Fumarate-FA (MULTIVITAMIN-PRENATAL) 27-0.8 MG TABS   Oral   Take 1 tablet by mouth daily at 12 noon.           BP 120/79  Pulse 85  Temp(Src) 97.6 F (36.4 C) (Oral)  Resp 18  Ht 5\' 8"  (1.727 m)  Wt 180 lb (81.647 kg)  BMI 27.38 kg/m2  SpO2 100%  LMP 09/28/2012  Physical Exam  Nursing note and vitals reviewed. Constitutional: She is oriented to person, place, and time. She appears well-developed and well-nourished. No distress.  HENT:  Head: Normocephalic and atraumatic.  Right Ear: External ear normal.  Left Ear: External ear normal.  Mouth/Throat: Oropharynx is clear and moist.  Eyes: Conjunctivae and EOM are normal. Pupils are equal, round, and reactive to light. No scleral icterus.  Neck: Normal range of motion. Neck supple.  Cardiovascular: Normal rate, regular rhythm and normal heart sounds.   Pulmonary/Chest: Effort normal and  breath sounds normal.  Abdominal: Soft. Bowel sounds are normal.  Musculoskeletal: Normal range of motion.  Neurological: She is alert and oriented to person, place, and time.  No sensory or motor deficit.  Skin: Skin is warm and dry.  Psychiatric: She has a normal mood and affect. Her behavior is normal.    ED Course  Procedures (including critical care time)  8:14 PM Pt was seen, physical exam was performed.  Call to [facility] --> advised to try Zofran 4 mg ODT and then Macrobid.  9:10 PM Not vomiting.  Will give Macrobid now.  1. Adverse reaction to anti-infectives, initial encounter   2. Pregnancy   3. UTI (lower urinary tract infection)          Carleene Cooper III, MD 12/25/12 2159

## 2012-12-25 NOTE — ED Notes (Addendum)
Vomiting today, thinks is due to keflex she is taking for uti.  Chills, sweating.  Pt is pregnant

## 2012-12-29 ENCOUNTER — Telehealth: Payer: Self-pay | Admitting: *Deleted

## 2012-12-29 NOTE — Telephone Encounter (Signed)
Spoke with pt. Miranda [name] prescribed Keflex. It caused nausea and vomiting. Went to [facility] ER on Thursday and was prescribed gen. Macrobid. Pt tolerating this med without problems. Has a scheduled appt. On 01/19/13. Does she need to be seen any sooner?

## 2012-12-29 NOTE — Telephone Encounter (Signed)
Pt. Was seen in ER 12/25/2012, had nausea and vomiting with Keflex, so the ER provider stopped it and prescribed Macrobid. And she is doing well so far.Keep appt. As scheduled, call with any problems.

## 2013-01-07 ENCOUNTER — Telehealth: Payer: Self-pay | Admitting: *Deleted

## 2013-01-07 ENCOUNTER — Ambulatory Visit (INDEPENDENT_AMBULATORY_CARE_PROVIDER_SITE_OTHER): Payer: BC Managed Care – PPO | Admitting: Obstetrics & Gynecology

## 2013-01-07 ENCOUNTER — Encounter: Payer: Self-pay | Admitting: Obstetrics & Gynecology

## 2013-01-07 VITALS — BP 100/60 | Wt 187.0 lb

## 2013-01-07 DIAGNOSIS — Z3401 Encounter for supervision of normal first pregnancy, first trimester: Secondary | ICD-10-CM

## 2013-01-07 DIAGNOSIS — Z331 Pregnant state, incidental: Secondary | ICD-10-CM

## 2013-01-07 DIAGNOSIS — B3731 Acute candidiasis of vulva and vagina: Secondary | ICD-10-CM

## 2013-01-07 DIAGNOSIS — B373 Candidiasis of vulva and vagina: Secondary | ICD-10-CM

## 2013-01-07 LAB — POCT URINALYSIS DIPSTICK
Blood, UA: NEGATIVE
Ketones, UA: NEGATIVE
Protein, UA: NEGATIVE

## 2013-01-07 MED ORDER — TERCONAZOLE 0.4 % VA CREA: 1.0000 | TOPICAL_CREAM | Freq: Every day | VAGINAL | Status: DC

## 2013-01-07 NOTE — Patient Instructions (Signed)
Monilial Vaginitis Vaginitis in a soreness, swelling and redness (inflammation) of the vagina and vulva. Monilial vaginitis is not a sexually transmitted infection. CAUSES  Yeast vaginitis is caused by yeast (candida) that is normally found in your vagina. With a yeast infection, the candida has overgrown in number to a point that upsets the chemical balance. SYMPTOMS   White, thick vaginal discharge.  Swelling, itching, redness and irritation of the vagina and possibly the lips of the vagina (vulva).  Burning or painful urination.  Painful intercourse. DIAGNOSIS  Things that may contribute to monilial vaginitis are:  Postmenopausal and virginal states.  Pregnancy.  Infections.  Being tired, sick or stressed, especially if you had monilial vaginitis in the past.  Diabetes. Good control will help lower the chance.  Birth control pills.  Tight fitting garments.  Using bubble bath, feminine sprays, douches or deodorant tampons.  Taking certain medications that kill germs (antibiotics).  Sporadic recurrence can occur if you become ill. TREATMENT  Your caregiver will give you medication.  There are several kinds of anti monilial vaginal creams and suppositories specific for monilial vaginitis. For recurrent yeast infections, use a suppository or cream in the vagina 2 times a week, or as directed.  Anti-monilial or steroid cream for the itching or irritation of the vulva may also be used. Get your caregiver's permission.  Painting the vagina with methylene blue solution may help if the monilial cream does not work.  Eating yogurt may help prevent monilial vaginitis. HOME CARE INSTRUCTIONS   Finish all medication as prescribed.  Do not have sex until treatment is completed or after your caregiver tells you it is okay.  Take warm sitz baths.  Do not douche.  Do not use tampons, especially scented ones.  Wear cotton underwear.  Avoid tight pants and panty  hose.  Tell your sexual partner that you have a yeast infection. They should go to their caregiver if they have symptoms such as mild rash or itching.  Your sexual partner should be treated as well if your infection is difficult to eliminate.  Practice safer sex. Use condoms.  Some vaginal medications cause latex condoms to fail. Vaginal medications that harm condoms are:  Cleocin cream.  Butoconazole (Femstat).  Terconazole (Terazol) vaginal suppository.  Miconazole (Monistat) (may be purchased over the counter). SEEK MEDICAL CARE IF:   You have a temperature by mouth above 102 F (38.9 C).  The infection is getting worse after 2 days of treatment.  The infection is not getting better after 3 days of treatment.  You develop blisters in or around your vagina.  You develop vaginal bleeding, and it is not your menstrual period.  You have pain when you urinate.  You develop intestinal problems.  You have pain with sexual intercourse. Document Released: 07/04/2005 Document Revised: 12/17/2011 Document Reviewed: 03/18/2009 Exit[employer] Patient Information 2013 [employer].  

## 2013-01-07 NOTE — Telephone Encounter (Signed)
Pt states she thinks she has UTI or yeast infections, c/o urgency, and vaginal itching.  2 months pregnant. Transferred call to front staff for appt today or tomorrow morning.

## 2013-01-07 NOTE — Progress Notes (Signed)
Recently treated with Keflex for UTI.  Thinks she has yeast infection.  Exam  + yeast,,Will treat with terazol per vagina x 7 days. Heart rate noted above.

## 2013-01-07 NOTE — Addendum Note (Signed)
Addended by: Lazaro Arms on: 01/07/2013 11:44 AM   Modules accepted: Orders

## 2013-01-07 NOTE — Progress Notes (Signed)
Pt having itching and white discharge x 4 days. Has not taken anything OTC.

## 2013-01-19 ENCOUNTER — Encounter: Payer: Self-pay | Admitting: Obstetrics & Gynecology

## 2013-01-19 ENCOUNTER — Other Ambulatory Visit: Payer: Self-pay | Admitting: Obstetrics & Gynecology

## 2013-01-19 ENCOUNTER — Ambulatory Visit (INDEPENDENT_AMBULATORY_CARE_PROVIDER_SITE_OTHER): Payer: BC Managed Care – PPO | Admitting: Obstetrics & Gynecology

## 2013-01-19 ENCOUNTER — Ambulatory Visit (INDEPENDENT_AMBULATORY_CARE_PROVIDER_SITE_OTHER): Payer: BC Managed Care – PPO

## 2013-01-19 VITALS — BP 118/78 | Wt 190.0 lb

## 2013-01-19 DIAGNOSIS — Z3400 Encounter for supervision of normal first pregnancy, unspecified trimester: Secondary | ICD-10-CM

## 2013-01-19 DIAGNOSIS — O9989 Other specified diseases and conditions complicating pregnancy, childbirth and the puerperium: Secondary | ICD-10-CM

## 2013-01-19 DIAGNOSIS — O09529 Supervision of elderly multigravida, unspecified trimester: Secondary | ICD-10-CM

## 2013-01-19 DIAGNOSIS — E079 Disorder of thyroid, unspecified: Secondary | ICD-10-CM

## 2013-01-19 DIAGNOSIS — B373 Candidiasis of vulva and vagina: Secondary | ICD-10-CM

## 2013-01-19 DIAGNOSIS — Z36 Encounter for antenatal screening of mother: Secondary | ICD-10-CM

## 2013-01-19 DIAGNOSIS — O24919 Unspecified diabetes mellitus in pregnancy, unspecified trimester: Secondary | ICD-10-CM

## 2013-01-19 DIAGNOSIS — E039 Hypothyroidism, unspecified: Secondary | ICD-10-CM | POA: Insufficient documentation

## 2013-01-19 LAB — POCT URINALYSIS DIPSTICK
Ketones, UA: NEGATIVE
Leukocytes, UA: NEGATIVE
Protein, UA: NEGATIVE

## 2013-01-19 MED ORDER — OMEPRAZOLE 20 MG PO CPDR: 20.0000 mg | DELAYED_RELEASE_CAPSULE | Freq: Every day | ORAL | Status: DC

## 2013-01-19 NOTE — Progress Notes (Signed)
Evening nausea, no heartburn.  Try prilosec to see if helps.  No bleeding.  No other complaints, sonogram reviewed with patient. 1st IT today, 2nd in 4 weeks

## 2013-01-19 NOTE — Progress Notes (Signed)
U/S(12+3wks)-singel IUP with + FCA noted, CRL c/w dates, cx long and closed, bilateral adnexa wnl, NB present , NT=1.23mm

## 2013-01-19 NOTE — Patient Instructions (Addendum)
Hypothyroidism and Pregnancy Hypothyroidism is a common condition seen in women more than men. It means you have an under-active thyroid gland. The thyroid gland is a hormone gland. It is located in your neck in front of your windpipe. This gland is controlled by the pituitary gland in your brain. The pituitary gland produces thyroid stimulating hormone (TSH). TSH controls the amount of thyroid hormone (TH) produced by the thyroid gland. With hypothyroidism, the gland does not produce enough TH. The body needs this hormone for metabolism. Metabolism is how your body works and handles food.  A baby (fetus) needs to get thyroid hormone from the mother. It is needed for normal growth and brain development. Babies who are born to mothers with hypothyroidism during pregnancy may have lowered IQ scores. They may also have low birth weight or be born prematurely. Their body movement may develop poorly, too. Common problems during pregnancy are fatigue and weight gain. These symptoms may be hidden by the pregnancy. Hypothyroidism can develop before or during pregnancy.  CAUSES   Thyroid gland abnormality.  The pituitary gland in the brain produces too much TSH.  The most common cause before, during and after pregnancy is chronic (lasting) thyroiditis. It is an autoimmune condition that affects the thyroid cells. This is called Hashimoto's disease.  Surgical removal of the thyroid (thyroidectomy).  Radioactive iodine treatment of the thyroid gland that can destroy the gland. This is not used during pregnancy since it can affect the baby.  Lack of iodine in your diet. Most salt products contains iodine.  Women with Type 1 diabetes have a 5 to 8% chance of developing hypothyroid disease while pregnant. Type 1 diabetic women have a 25% chance of developing the disease after they have their baby. SYMPTOMS  Symptoms of hypothyroidism can develop slowly. They can go undetected if the symptoms are mild. This can  be prevented with early detection in the pregnant mother. When you are pregnant, a mild form of hypothyroid disease can develop into a full blown disease because of an increase in the metabolism (destruction) of thyroid hormone in your body during pregnancy. If you are considering pregnancy, and you or an immediate family member have had problems thyroid problems, tell your caregiver. Make sure that you are watched closely as your caregiver suggests. Some problems you may have before or during pregnancy are:  Constipation.  Fatigue.  Intolerance to cold.  Mental weariness. More advanced problems with hypothyroidism may include:   Hoarseness.  Swelling of the lower legs.  Dry and thickened skin.  Slowness of thinking.  Decreased sex drive (libido).  Slowed speech.  Weight gain.  Muscle cramps.  Insomnia.  Slow reflexes.  Changes in your voice (deeper).  Puffy face and feet.  Thin, coarse hair.  Thinning of eyebrows.  Decreased appetite.  Increase incidence of carpal tunnel syndrome.  Coma. Signs of Hypothyroidism include:  Enlarged thyroid gland (goiter).  Small round growths (nodules) in the thyroid gland.  Increase in thyroid stimulating hormone. (A blood test is needed.)  Decrease in thyroid hormone. (A blood test is needed.) Common problems before pregnancy can include:  Inability to get pregnant.  Changes in menstrual periods.  No menstrual period (amenorrhea).  Miscarriage. Common thyroid problems during or after pregnancy can include:  The development of high blood pressure and the chance of a premature delivery are more common.  Babies born to women with untreated hypothyroidism may not have good development of the brain.  Preterm delivery.  Low birth   weight babies.  Preeclampsia.  Placental abruption.  Cretinism in baby (mental retardation, failure to grow, nerve (neurologic) and psychological problems).  Stillbirth. DIAGNOSIS   Diagnosis is based upon the signs and symptoms of the patient. It is confirmed by blood tests ( increased TSH and decreased TH), ultrasound, and radioactive iodine uptake tests. The radioactive iodine uptake test is not done when you are pregnant. When hypothyroidism is diagnosed early, it can be treated. There should be no problems for you or your baby. Goiter or thyroid nodules found in a pregnant woman should be tested to be sure there is no cancer present. Anyone with a history or family history of thyroid disease should be tested for thyroid disease. TREATMENT  When hypothyroidism is diagnosed early, it can be treated. Treatment during pregnancy should not cause any harm to your baby.  Your caregiver will watch your thyroid hormones (TSH and TH) closely during the pregnancy.  Increase or decrease the dose of thyroid medication as your caregiver advises. This medicine is safe for you and your baby.  Avoid medications or supplements that can block the absorption of the hormone you are taking. For instance, calcium and iron are medications that may decrease the benefits of your thyroid medicine. Taking medications several hours apart will help this.  THS and TH should be checked each month of the pregnancy. At times, they should be checked more often in the third trimester.  All the states, including [place], [place], offer screening tests for hypothyroidism in newborn babies.  If this disease is treated in the first few weeks after the baby is born, it can prevent abnormal growth and mental retardation. The pediatrician should be informed if the mother had hypothyroidism. HOME CARE INSTRUCTIONS   Avoid medications or supplements, such as calcium and iron, that can block the absorption of the hormone you are taking. If you must take these medications, take them several hours apart.  Pregnant women should take multivitamins that contain iodine. Your caregiver may suggest that you take of  iodine.  Women planning to get pregnant should take a multivitamin containing iodine. Your caregiver may suggest that you take of iodine.  Include foods in your diet that contain iodine, such as iodized salt, spinach and shell fish.  Follow the advice of your caregiver regarding your medication and getting the necessary blood tests.  Get tested for thyroid disease before getting pregnant if you or someone in your family has had thyroid problems.  Get tested if you think you have symptoms of thyroid disease. SEEK IMMEDIATE MEDICAL CARE IF:   You have decreased or no movements of the baby.  You develop muscle cramps.  You have belly (abdominal) pain.  You gain too much weight in one week (5 pounds or more).  You develop a severe headache or vision problems.  You develop severe swelling of your legs and ankles.  You develop a lump in your neck.  You think you have symptoms of thyroid disease. MAKE SURE YOU:   Understand these instructions.  Will watch your condition.  Will get help right away if you are not doing well or get worse. Document Released: 07/22/2007 Document Revised: 12/17/2011 Document Reviewed: 03/21/2009 [facility] Patient Information 2013 [place], [place].

## 2013-01-22 LAB — MATERNAL SCREEN, INTEGRATED #1

## 2013-02-04 ENCOUNTER — Ambulatory Visit (INDEPENDENT_AMBULATORY_CARE_PROVIDER_SITE_OTHER): Payer: BC Managed Care – PPO | Admitting: Obstetrics & Gynecology

## 2013-02-04 VITALS — BP 100/64 | Temp 98.2°F | Wt 192.0 lb

## 2013-02-04 DIAGNOSIS — O9989 Other specified diseases and conditions complicating pregnancy, childbirth and the puerperium: Secondary | ICD-10-CM

## 2013-02-04 DIAGNOSIS — Z3402 Encounter for supervision of normal first pregnancy, second trimester: Secondary | ICD-10-CM

## 2013-02-04 DIAGNOSIS — O24919 Unspecified diabetes mellitus in pregnancy, unspecified trimester: Secondary | ICD-10-CM

## 2013-02-04 DIAGNOSIS — O24912 Unspecified diabetes mellitus in pregnancy, second trimester: Secondary | ICD-10-CM

## 2013-02-04 DIAGNOSIS — J329 Chronic sinusitis, unspecified: Secondary | ICD-10-CM

## 2013-02-04 DIAGNOSIS — J019 Acute sinusitis, unspecified: Secondary | ICD-10-CM

## 2013-02-04 DIAGNOSIS — E039 Hypothyroidism, unspecified: Secondary | ICD-10-CM

## 2013-02-04 DIAGNOSIS — Z1389 Encounter for screening for other disorder: Secondary | ICD-10-CM

## 2013-02-04 DIAGNOSIS — O99891 Other specified diseases and conditions complicating pregnancy: Secondary | ICD-10-CM

## 2013-02-04 DIAGNOSIS — E119 Type 2 diabetes mellitus without complications: Secondary | ICD-10-CM | POA: Insufficient documentation

## 2013-02-04 DIAGNOSIS — Z331 Pregnant state, incidental: Secondary | ICD-10-CM

## 2013-02-04 LAB — POCT URINALYSIS DIPSTICK
Blood, UA: NEGATIVE
Glucose, UA: NEGATIVE
Leukocytes, UA: NEGATIVE
Nitrite, UA: NEGATIVE

## 2013-02-04 MED ORDER — AMOXICILLIN-POT CLAVULANATE 875-125 MG PO TABS: 1.0000 | ORAL_TABLET | Freq: Two times a day (BID) | ORAL | Status: DC

## 2013-02-04 NOTE — Patient Instructions (Signed)
Sinus Headache  A sinus headache happens when your sinuses become clogged or puffy (swollen). Sinus headaches can be mild or severe.  HOME CARE   Take your medicines (antibiotics) as told. Finish them even if you start to feel better.   Only take medicine as told by your doctor.   Use a nose spray if you feel stuffed up (congested).  GET HELP RIGHT AWAY IF:   You have a fever.   You have trouble seeing.   You suddenly have pain in your face or head.   You start to twitch or shake (seizure).   You are confused.   You get headaches more than once a week.   Light or sound bothers you.   You feel sick to your stomach (nauseous) or throw up (vomit).   Your headaches do not get better with treatment.  MAKE SURE YOU:   Understand these instructions.   Will watch your condition.   Will get help right away if you are not doing well or get worse.  Document Released: 01/24/2011 Document Revised: 12/17/2011 Document Reviewed: 01/24/2011  Exit[employer] Patient Information 2013 [employer].

## 2013-02-04 NOTE — Progress Notes (Signed)
See notes below Also has sinus infection on symptoms and exam  Augmentin 875 bid

## 2013-02-04 NOTE — Progress Notes (Signed)
Pt states was seen at [facility] and admitted for 3 days for dehydration, decrease potassium, decrease, WBC fever,diarrhea, nausea and vomiting. Continues to c/o "terrible headache thinks she has a sinus infection"

## 2013-02-17 ENCOUNTER — Encounter: Payer: Self-pay | Admitting: *Deleted

## 2013-02-18 ENCOUNTER — Ambulatory Visit (INDEPENDENT_AMBULATORY_CARE_PROVIDER_SITE_OTHER): Payer: BC Managed Care – PPO | Admitting: Advanced Practice Midwife

## 2013-02-18 ENCOUNTER — Encounter: Payer: Self-pay | Admitting: Advanced Practice Midwife

## 2013-02-18 ENCOUNTER — Other Ambulatory Visit: Payer: Self-pay | Admitting: Advanced Practice Midwife

## 2013-02-18 VITALS — BP 116/72 | Wt 191.0 lb

## 2013-02-18 DIAGNOSIS — Q231 Congenital insufficiency of aortic valve: Secondary | ICD-10-CM

## 2013-02-18 DIAGNOSIS — O24919 Unspecified diabetes mellitus in pregnancy, unspecified trimester: Secondary | ICD-10-CM

## 2013-02-18 DIAGNOSIS — Q2381 Bicuspid aortic valve: Secondary | ICD-10-CM

## 2013-02-18 DIAGNOSIS — O99891 Other specified diseases and conditions complicating pregnancy: Secondary | ICD-10-CM

## 2013-02-18 DIAGNOSIS — O0990 Supervision of high risk pregnancy, unspecified, unspecified trimester: Secondary | ICD-10-CM | POA: Insufficient documentation

## 2013-02-18 DIAGNOSIS — O24912 Unspecified diabetes mellitus in pregnancy, second trimester: Secondary | ICD-10-CM

## 2013-02-18 DIAGNOSIS — E039 Hypothyroidism, unspecified: Secondary | ICD-10-CM

## 2013-02-18 DIAGNOSIS — Z1389 Encounter for screening for other disorder: Secondary | ICD-10-CM

## 2013-02-18 DIAGNOSIS — Z331 Pregnant state, incidental: Secondary | ICD-10-CM

## 2013-02-18 LAB — POCT URINALYSIS DIPSTICK
Glucose, UA: NEGATIVE
Leukocytes, UA: NEGATIVE
Nitrite, UA: NEGATIVE
Protein, UA: NEGATIVE

## 2013-02-18 LAB — TSH: TSH: 3.31 u[IU]/mL (ref 0.350–4.500)

## 2013-02-18 NOTE — Progress Notes (Signed)
No c/o at this time.  Routine questions about pregnancy answered. Not checking sugars very often.  Encouraged to at least check FBS and 2hrs after a meal for now.   F/U in 2 weeks for anatomy scan/HROB.

## 2013-02-18 NOTE — Progress Notes (Signed)
2nd IT and Thyroid labs today.

## 2013-02-18 NOTE — Assessment & Plan Note (Signed)
Class B: 2x/week testing >32weeks; Growth u/s 28/36 weeks

## 2013-02-22 LAB — MATERNAL SCREEN, INTEGRATED #2
AFP MoM: 0.83
Calculated Gestational Age: 16.6
Estriol Mom: 0.74
Inhibin A Dimeric: 239 pg/mL
MSS Down Syndrome: 1:670 {titer}
MSS Trisomy 18 Risk: <1:5000 {titer}
NT MoM: 1.16
Number of fetuses: 1
PAPP-A MoM: 0.64
PAPP-A: 396 ng/mL
Rish for ONTD: <1:5000 {titer}
hCG, Serum: 28.6 IU/mL

## 2013-03-03 ENCOUNTER — Ambulatory Visit (INDEPENDENT_AMBULATORY_CARE_PROVIDER_SITE_OTHER): Payer: BC Managed Care – PPO | Admitting: Internal Medicine

## 2013-03-03 ENCOUNTER — Encounter: Payer: Self-pay | Admitting: Internal Medicine

## 2013-03-03 VITALS — BP 110/70 | HR 73 | Ht 68.0 in | Wt 194.0 lb

## 2013-03-03 DIAGNOSIS — Q231 Congenital insufficiency of aortic valve: Secondary | ICD-10-CM

## 2013-03-03 DIAGNOSIS — R002 Palpitations: Secondary | ICD-10-CM | POA: Insufficient documentation

## 2013-03-03 NOTE — Progress Notes (Signed)
OFFICE NOTE  Chief Complaint:  Routine followup  Primary Care Physician: No PCP Per Patient  HPI:  Miranda [name] is a 32 year old female who is recently divorced (prior last name was Miranda [name]), and has had episodes of presyncope and palpitations as well as orthostatic blood pressure changes and heart racing. She had PVCs and underwent a stress test which was normal with good exercise tolerance and had an echo which unfortunately showed a bicuspid aortic valve with mild stenosis. She continues to have palpitation episodes as well as some shortness of breath and pressure in her chest. It is not clear what these symptoms are related to; however, they could be related to arrhythmia or PVCs. She returns for routine followup today and it is notable that she is now pregnant. She was previously prescribed a beta blocker but did not start taking that due to her pregnancy.  She continues to have palpitations although not as frequent and is not particularly bothered by them.  PMHx:  Past Medical History  Diagnosis Date  . Thyroid disease   . Diabetes mellitus   . Pregnant   . Heart valve problem   . Palpitation   . Chest pain 07/24/2012    2D Echo EF 60%-65% which shoewd a bicuspid aortic vavle with mild stenosis  . PVC (premature ventricular contraction) 07/24/2012    stress test which was normal with good excercise    Past Surgical History  Procedure Laterality Date  . No past surgeries      FAMHx:  Family History  Problem Relation Age of Onset  . Hypertension Mother   . Diabetes Brother   . Diabetes Maternal Grandmother   . Congestive Heart Failure Maternal Grandmother     SOCHx:   reports that she has quit smoking. Her smoking use included Cigarettes. She has a 2.5 pack-year smoking history. She does not have any smokeless tobacco history on file. She reports that she does not drink alcohol or use illicit drugs.  ALLERGIES:  Allergies  Allergen Reactions  . Keflex (Cephalexin)      ROS: A comprehensive review of systems was negative except for: Cardiovascular: positive for palpitations pregnancy  HOME MEDS: Current Outpatient Prescriptions  Medication Sig Dispense Refill  . levothyroxine (SYNTHROID, LEVOTHROID) 75 MCG tablet Take 75 mcg by mouth daily.      . metFORMIN (GLUCOPHAGE-XR) 500 MG 24 hr tablet Take 500 mg by mouth daily.      . Prenatal Vit-Fe Fumarate-FA (MULTIVITAMIN-PRENATAL) 27-0.8 MG TABS Take 1 tablet by mouth daily at 12 noon.      Marland Kitchen terconazole (TERAZOL 7) 0.4 % vaginal cream Place 1 applicator vaginally as needed.      Marland Kitchen amoxicillin-clavulanate (AUGMENTIN) 875-125 MG per tablet Take 1 tablet by mouth 2 (two) times daily.  20 tablet  0  . nitrofurantoin, macrocrystal-monohydrate, (MACROBID) 100 MG capsule Take 1 capsule (100 mg total) by mouth 2 (two) times daily.  10 capsule  0  . omeprazole (PRILOSEC) 20 MG capsule Take 1 capsule (20 mg total) by mouth daily.  30 capsule  6  . ondansetron (ZOFRAN) 4 MG tablet Take 1 tablet (4 mg total) by mouth every 6 (six) hours.  12 tablet  0   No current facility-administered medications for this visit.    LABS/IMAGING: No results found for this or any previous visit (from the past 48 hour(s)). No results found.  VITALS: BP 110/70  Pulse 73  Ht 5\' 8"  (1.727 m)  Wt 194 lb (87.998  kg)  BMI 29.5 kg/m2  LMP 09/28/2012  EXAM: General appearance: alert and no distress Neck: no adenopathy, no carotid bruit, no JVD, supple, symmetrical, trachea midline and thyroid not enlarged, symmetric, no tenderness/mass/nodules Lungs: clear to auscultation bilaterally Heart: regular rate and rhythm, S1, S2 normal, no murmur, click, rub or gallop Abdomen: Gravid uterus Extremities: extremities normal, atraumatic, no cyanosis or edema Pulses: 2+ and symmetric Skin: Skin color, texture, turgor normal. No rashes or lesions Neurologic: Grossly normal  EKG: Normal sinus rhythm at 73  ASSESSMENT: 1. Bicuspid  aortic valve with mild stenosis 2. Palpitations 3. Pregnancy  PLAN: 1.   [name] has an unplanned pregnancy, but is doing fairly well. She is at increased risk of complications given her bicuspid aortic valve, however seeing that it is only mildly stenotic I suspect that she will do fine through delivery.  I would not recommend beta-blockade do to increased risk of intrauterine growth restriction.  Her palpitations are infrequent and manageable. She tells me that the plan is to have her baby at [facility] in [place]. We will be available as needed if there are complications in the peripartum period.  Chrystie Nose, MD, [facility] Attending Cardiologist [facility]  HILTY,Kenneth C 03/03/2013, 5:14 PM

## 2013-03-03 NOTE — Patient Instructions (Signed)
Your physician recommends that you schedule a follow-up appointment in: 12 months in [place]

## 2013-03-04 ENCOUNTER — Ambulatory Visit (INDEPENDENT_AMBULATORY_CARE_PROVIDER_SITE_OTHER): Payer: BC Managed Care – PPO | Admitting: Obstetrics and Gynecology

## 2013-03-04 ENCOUNTER — Ambulatory Visit: Payer: BC Managed Care – PPO

## 2013-03-04 ENCOUNTER — Encounter: Payer: Self-pay | Admitting: Obstetrics and Gynecology

## 2013-03-04 VITALS — BP 122/80 | Wt 196.0 lb

## 2013-03-04 DIAGNOSIS — Q231 Congenital insufficiency of aortic valve: Secondary | ICD-10-CM

## 2013-03-04 DIAGNOSIS — Z1389 Encounter for screening for other disorder: Secondary | ICD-10-CM

## 2013-03-04 DIAGNOSIS — O24919 Unspecified diabetes mellitus in pregnancy, unspecified trimester: Secondary | ICD-10-CM

## 2013-03-04 DIAGNOSIS — E119 Type 2 diabetes mellitus without complications: Secondary | ICD-10-CM

## 2013-03-04 DIAGNOSIS — Q2381 Bicuspid aortic valve: Secondary | ICD-10-CM

## 2013-03-04 LAB — POCT URINALYSIS DIPSTICK
Glucose, UA: NEGATIVE
Ketones, UA: NEGATIVE
Leukocytes, UA: NEGATIVE
Nitrite, UA: NEGATIVE

## 2013-03-04 NOTE — Patient Instructions (Signed)
Notify us if pvc's return

## 2013-03-04 NOTE — Progress Notes (Signed)
Pt here today for routine visit. C/o some round ligament pain. Pt denies any problems or issues. Discussed bicuspid Mitral valve, pt having NO notable pvc's at present Detail u/s rescheduled.

## 2013-03-06 ENCOUNTER — Other Ambulatory Visit: Payer: Self-pay | Admitting: Obstetrics & Gynecology

## 2013-03-06 DIAGNOSIS — Z1389 Encounter for screening for other disorder: Secondary | ICD-10-CM

## 2013-03-09 ENCOUNTER — Ambulatory Visit (INDEPENDENT_AMBULATORY_CARE_PROVIDER_SITE_OTHER): Payer: BC Managed Care – PPO

## 2013-03-09 DIAGNOSIS — Z1389 Encounter for screening for other disorder: Secondary | ICD-10-CM

## 2013-03-09 DIAGNOSIS — O99891 Other specified diseases and conditions complicating pregnancy: Secondary | ICD-10-CM

## 2013-03-09 DIAGNOSIS — E079 Disorder of thyroid, unspecified: Secondary | ICD-10-CM

## 2013-03-09 DIAGNOSIS — O24919 Unspecified diabetes mellitus in pregnancy, unspecified trimester: Secondary | ICD-10-CM

## 2013-03-09 DIAGNOSIS — O24912 Unspecified diabetes mellitus in pregnancy, second trimester: Secondary | ICD-10-CM

## 2013-03-09 NOTE — Progress Notes (Signed)
U/S(19+3wks) active fetus, meas c/w dates, fluid wnl, post gr 0 plac, cx long and closed, bilateral adnexa wnl, no major abnl noted although sub-optimal views, female fetus

## 2013-03-12 ENCOUNTER — Encounter: Payer: Self-pay | Admitting: Internal Medicine

## 2013-03-13 ENCOUNTER — Telehealth: Payer: Self-pay | Admitting: Obstetrics and Gynecology

## 2013-03-17 ENCOUNTER — Encounter: Payer: Self-pay | Admitting: Obstetrics and Gynecology

## 2013-03-17 NOTE — Telephone Encounter (Signed)
Spoke with pt. I let her know that we tried to call but was unable to leave a message. Pt was having pain, but it went away. Never had any spotting. No problems now. Advised to call with any other questions or concerns. Pt voiced understanding. JSY

## 2013-03-18 LAB — US OB DETAIL + 14 WK

## 2013-03-18 LAB — US OB COMP + 14 WK

## 2013-04-01 ENCOUNTER — Ambulatory Visit (INDEPENDENT_AMBULATORY_CARE_PROVIDER_SITE_OTHER): Payer: BC Managed Care – PPO | Admitting: Obstetrics and Gynecology

## 2013-04-01 VITALS — BP 120/74 | Wt 202.4 lb

## 2013-04-01 DIAGNOSIS — O24919 Unspecified diabetes mellitus in pregnancy, unspecified trimester: Secondary | ICD-10-CM

## 2013-04-01 DIAGNOSIS — O24912 Unspecified diabetes mellitus in pregnancy, second trimester: Secondary | ICD-10-CM

## 2013-04-01 DIAGNOSIS — O99282 Endocrine, nutritional and metabolic diseases complicating pregnancy, second trimester: Secondary | ICD-10-CM

## 2013-04-01 DIAGNOSIS — Z1389 Encounter for screening for other disorder: Secondary | ICD-10-CM

## 2013-04-01 DIAGNOSIS — E039 Hypothyroidism, unspecified: Secondary | ICD-10-CM

## 2013-04-01 LAB — POCT URINALYSIS DIPSTICK
Blood, UA: NEGATIVE
Ketones, UA: NEGATIVE
Leukocytes, UA: NEGATIVE

## 2013-04-01 LAB — TSH: TSH: 3.351 u[IU]/mL (ref 0.350–4.500)

## 2013-04-01 LAB — HEMOGLOBIN A1C: Hgb A1c MFr Bld: 5.4 % (ref <5.7)

## 2013-04-01 MED ORDER — METFORMIN HCL 500 MG PO TABS: 1000.0000 mg | ORAL_TABLET | Freq: Two times a day (BID) | ORAL | Status: DC

## 2013-04-01 MED ORDER — GLUCOSE BLOOD VI STRP: ORAL_STRIP | Status: DC

## 2013-04-01 NOTE — Patient Instructions (Addendum)
Check CBG 4 times daily Switch to metformin 500 twice daily with meals After one week, increase to 500 mg x 2 tablets twice daily with meals.       Gestational Diabetes Mellitus Gestational diabetes mellitus, often simply referred to as gestational diabetes, is a type of diabetes that some women develop during pregnancy. In gestational diabetes, the pancreas does not make enough insulin (a hormone), the cells are less responsive to the insulin that is made (insulin resistance), or both.Normally, insulin moves sugars from food into the tissue cells. The tissue cells use the sugars for energy. The lack of insulin or the lack of normal response to insulin causes excess sugars to build up in the blood instead of going into the tissue cells. As a result, high blood sugar (hyperglycemia) develops. The effect of high sugar (glucose) levels can cause many complications.  RISK FACTORS You have an increased chance of developing gestational diabetes if you have a family history of diabetes and also have one or more of the following risk factors:  A body mass index over 30 (obesity).  A previous pregnancy with gestational diabetes.  An older age at the time of pregnancy. If blood glucose levels are kept in the normal range during pregnancy, women can have a healthy pregnancy. If your blood glucose levels are not well controlled, there may be risks to you, your unborn baby (fetus), your labor and delivery, or your newborn baby.  SYMPTOMS  If symptoms are experienced, they are much like symptoms you would normally expect during pregnancy. The symptoms of gestational diabetes include:   Increased thirst (polydipsia).  Increased urination (polyuria).  Increased urination during the night (nocturia).  Weight loss. This weight loss may be rapid.  Frequent, recurring infections.  Tiredness (fatigue).  Weakness.  Vision changes, such as blurred vision.  Fruity smell to your breath.  Abdominal  pain. DIAGNOSIS Diabetes is diagnosed when blood glucose levels are increased. Your blood glucose level may be checked by one or more of the following blood tests:  A fasting blood glucose test. You will not be allowed to eat for at least 8 hours before a blood sample is taken.  A random blood glucose test. Your blood glucose is checked at any time of the day regardless of when you ate.  A hemoglobin A1c blood glucose test. A hemoglobin A1c test provides information about blood glucose control over the previous 3 months.  An oral glucose tolerance test (OGTT). Your blood glucose is measured after you have not eaten (fasted) for 1 3 hours and then after you drink a glucose-containing beverage. Since the hormones that cause insulin resistance are highest at about 24 28 weeks of a pregnancy, an OGTT is usually performed during that time. If you have risk factors for gestational diabetes, your caregiver may test you for gestational diabetes earlier than 24 weeks of pregnancy. TREATMENT   You will need to take diabetes medicine or insulin daily to keep blood glucose levels in the desired range.  You will need to match insulin dosing with exercise and healthy food choices. The treatment goal is to maintain the before meal (preprandial), bedtime, and overnight blood glucose level at 60 99 mg/dL during pregnancy. The treatment goal is to further maintain peak after meal blood sugar (postprandial glucose) level at 100 140 mg/dL.  HOME CARE INSTRUCTIONS   Have your hemoglobin A1c level checked twice a year.  Perform daily blood glucose monitoring as directed by your caregiver. It is common  to perform frequent blood glucose monitoring.  Monitor urine ketones when you are ill and as directed by your caregiver.  Take your diabetes medicine and insulin as directed by your caregiver to maintain your blood glucose level in the desired range.  Never run out of diabetes medicine or insulin. It is needed  every day.  Adjust insulin based on your intake of carbohydrates. Carbohydrates can raise blood glucose levels but need to be included in your diet. Carbohydrates provide vitamins, minerals, and fiber which are an essential part of a healthy diet. Carbohydrates are found in fruits, vegetables, whole grains, dairy products, legumes, and foods containing added sugars.    Eat healthy foods. Alternate 3 meals with 3 snacks.  Maintain a healthy weight gain. The usual total expected weight gain varies according to your prepregnancy body mass index (BMI).  Carry a medical alert card or wear your medical alert jewelry.  Carry a 15 gram carbohydrate snack with you at all times to treat low blood glucose (hypoglycemia). Some examples of 15 gram carbohydrate snacks include:  Glucose tablets, 3 or 4   Glucose gel, 15 gram tube  Raisins, 2 tablespoons (24 g)  Jelly beans, 6  Animal crackers, 8  Fruit juice, regular soda, or low fat milk, 4 ounces (120 mL)  Gummy treats, 9    Recognize hypoglycemia. Hypoglycemia during pregnancy occurs with blood glucose levels of 60 mg/dL and below. The risk for hypoglycemia increases when fasting or skipping meals, during or after intense exercise, and during sleep. Hypoglycemia symptoms can include:  Tremors or shakes.  Decreased ability to concentrate.  Sweating.  Increased heart rate.  Headache.  Dry mouth.  Hunger.  Irritability.  Anxiety.  Restless sleep.  Altered speech or coordination.  Confusion.  Treat hypoglycemia promptly. If you are alert and able to safely swallow, follow the 15:15 rule:  Take 15 20 grams of rapid-acting glucose or carbohydrate. Rapid-acting options include glucose gel, glucose tablets, or 4 ounces (120 mL) of fruit juice, regular soda, or low fat milk.  Check your blood glucose level 15 minutes after taking the glucose.   Take 15 20 grams more of glucose if the repeat blood glucose level is still 70  mg/dL or below.  Eat a meal or snack within 1 hour once blood glucose levels return to normal.  Be alert to polyuria and polydipsia which are early signs of hyperglycemia. An early awareness of hyperglycemia allows for prompt treatment. Treat hyperglycemia as directed by your caregiver.  Engage in at least 30 minutes of physical activity a day or as directed by your caregiver. Ten minutes of physical activity timed 30 minutes after each meal is encouraged to control postprandial blood glucose levels.  Adjust your insulin dosing and food intake as needed if you start a new exercise or sport.  Follow your sick day plan at any time you are unable to eat or drink as usual.  Avoid tobacco and alcohol use.  Follow up with your caregiver regularly.  Follow the advice of your caregiver regarding your prenatal and post-delivery (postpartum) appointments, meal planning, exercise, medicines, vitamins, blood tests, other medical tests, and physical activities.  Perform daily skin and foot care. Examine your skin and feet daily for cuts, bruises, redness, nail problems, bleeding, blisters, or sores.  Brush your teeth and gums at least twice a day and floss at least once a day. Follow up with your dentist regularly.  Schedule an eye exam during the first trimester of your  pregnancy or as directed by your caregiver.  Share your diabetes management plan with your workplace or school.  Stay up-to-date with immunizations.  Learn to manage stress.  Obtain ongoing diabetes education and support as needed. SEEK MEDICAL CARE IF:   You are unable to eat food or drink fluids for more than 6 hours.  You have nausea and vomiting for more than 6 hours.  You have a blood glucose level of 200 mg/dL and you have ketones in your urine.  There is a change in mental status.  You develop vision problems.  You have a persistent headache.  You have upper abdominal pain or discomfort.  You develop an  additional serious illness.  You have diarrhea for more than 6 hours.  You have been sick or have had a fever for a couple of days and are not getting better. SEEK IMMEDIATE MEDICAL CARE IF:   You have difficulty breathing.  You no longer feel the baby moving.  You are bleeding or have discharge from your vagina.  You start having premature contractions or labor. MAKE SURE YOU:  Understand these instructions.  Will watch your condition.  Will get help right away if you are not doing well or get worse. Document Released: 12/31/2000 Document Revised: 06/18/2012 Document Reviewed: 04/22/2012 [facility] Patient Information 2014 [place], [place].

## 2013-04-01 NOTE — Progress Notes (Signed)
Prob 1 DM: infrequently checks cbg;s  fastings 88-121, PC bkfast 96-124, PC [id number].                P: increase Metformin to 500 bidx 1wk, then incr to 1000 bid                      chk A1C._____ Hypothyroid : rechk TSH.____ Bicuspid Aortic valve with good Ejection Fraction 60-65%:  Stable. Rechk 2 wk.

## 2013-04-01 NOTE — Assessment & Plan Note (Signed)
Plan : check 22wk, 28wk, 34 wk

## 2013-04-01 NOTE — Progress Notes (Signed)
Pt here today for routine visit. Pt denies any problems at this time. Pt stated she needed a Rx for test strips.

## 2013-04-05 ENCOUNTER — Telehealth: Payer: Self-pay | Admitting: Obstetrics and Gynecology

## 2013-04-05 MED ORDER — LEVOTHYROXINE SODIUM 100 MCG PO TABS: 100.0000 ug | ORAL_TABLET | Freq: Every day | ORAL | Status: DC

## 2013-04-05 NOTE — Telephone Encounter (Signed)
Patient dose of Synthroid will be increased from 75 mcg to 100 mcg daily as TSH slightly higher at 3.35

## 2013-04-05 NOTE — Telephone Encounter (Signed)
Pt to increase synthroid to 100 mcg daily.

## 2013-04-16 ENCOUNTER — Encounter: Payer: Self-pay | Admitting: Advanced Practice Midwife

## 2013-04-16 ENCOUNTER — Ambulatory Visit (INDEPENDENT_AMBULATORY_CARE_PROVIDER_SITE_OTHER): Payer: BC Managed Care – PPO | Admitting: Advanced Practice Midwife

## 2013-04-16 VITALS — BP 110/78 | Wt 205.0 lb

## 2013-04-16 DIAGNOSIS — E119 Type 2 diabetes mellitus without complications: Secondary | ICD-10-CM

## 2013-04-16 DIAGNOSIS — Z331 Pregnant state, incidental: Secondary | ICD-10-CM

## 2013-04-16 DIAGNOSIS — O24912 Unspecified diabetes mellitus in pregnancy, second trimester: Secondary | ICD-10-CM

## 2013-04-16 DIAGNOSIS — E039 Hypothyroidism, unspecified: Secondary | ICD-10-CM

## 2013-04-16 DIAGNOSIS — O24919 Unspecified diabetes mellitus in pregnancy, unspecified trimester: Secondary | ICD-10-CM

## 2013-04-16 DIAGNOSIS — O0992 Supervision of high risk pregnancy, unspecified, second trimester: Secondary | ICD-10-CM

## 2013-04-16 DIAGNOSIS — Z1389 Encounter for screening for other disorder: Secondary | ICD-10-CM

## 2013-04-16 DIAGNOSIS — E079 Disorder of thyroid, unspecified: Secondary | ICD-10-CM

## 2013-04-16 LAB — POCT URINALYSIS DIPSTICK
Ketones, UA: NEGATIVE
Leukocytes, UA: NEGATIVE
Protein, UA: NEGATIVE

## 2013-04-16 NOTE — Progress Notes (Signed)
Saw cardiologist last  Month.  No recommendations or new treatment needed. No c/o at this time.  Routine questions about pregnancy answered.   A1C was 5.4 on 6/25.  Marland KitchenHas been checking bs QID.  Was very upset that she forgot her log.  States alll bs are withing range.  C/O itching vaginally yesterday.None today. SSE:  Normal appearing discharge. F/U in 4 weeks for PN2 labs except gtt.

## 2013-04-21 ENCOUNTER — Encounter: Payer: Self-pay | Admitting: *Deleted

## 2013-05-08 ENCOUNTER — Ambulatory Visit (INDEPENDENT_AMBULATORY_CARE_PROVIDER_SITE_OTHER): Payer: BC Managed Care – PPO

## 2013-05-08 ENCOUNTER — Other Ambulatory Visit: Payer: BC Managed Care – PPO

## 2013-05-08 ENCOUNTER — Encounter: Payer: Self-pay | Admitting: Obstetrics and Gynecology

## 2013-05-08 ENCOUNTER — Ambulatory Visit (INDEPENDENT_AMBULATORY_CARE_PROVIDER_SITE_OTHER): Payer: BC Managed Care – PPO | Admitting: Obstetrics and Gynecology

## 2013-05-08 VITALS — BP 100/70 | Wt 209.0 lb

## 2013-05-08 DIAGNOSIS — Z3400 Encounter for supervision of normal first pregnancy, unspecified trimester: Secondary | ICD-10-CM

## 2013-05-08 DIAGNOSIS — O0990 Supervision of high risk pregnancy, unspecified, unspecified trimester: Secondary | ICD-10-CM

## 2013-05-08 DIAGNOSIS — O24919 Unspecified diabetes mellitus in pregnancy, unspecified trimester: Secondary | ICD-10-CM

## 2013-05-08 DIAGNOSIS — Z1389 Encounter for screening for other disorder: Secondary | ICD-10-CM

## 2013-05-08 DIAGNOSIS — Z331 Pregnant state, incidental: Secondary | ICD-10-CM

## 2013-05-08 DIAGNOSIS — E119 Type 2 diabetes mellitus without complications: Secondary | ICD-10-CM

## 2013-05-08 DIAGNOSIS — Q231 Congenital insufficiency of aortic valve: Secondary | ICD-10-CM

## 2013-05-08 DIAGNOSIS — R002 Palpitations: Secondary | ICD-10-CM

## 2013-05-08 DIAGNOSIS — E039 Hypothyroidism, unspecified: Secondary | ICD-10-CM

## 2013-05-08 DIAGNOSIS — E079 Disorder of thyroid, unspecified: Secondary | ICD-10-CM

## 2013-05-08 LAB — POCT URINALYSIS DIPSTICK
Blood, UA: NEGATIVE
Glucose, UA: NEGATIVE
Nitrite, UA: NEGATIVE
Protein, UA: NEGATIVE

## 2013-05-08 LAB — CBC
HCT: 32.5 % — ABNORMAL LOW (ref 36.0–46.0)
MCV: 85.3 fL (ref 78.0–100.0)
RDW: 13.3 % (ref 11.5–15.5)
WBC: 10.5 10*3/uL (ref 4.0–10.5)

## 2013-05-08 LAB — TSH: TSH: 1.641 u[IU]/mL (ref 0.350–4.500)

## 2013-05-08 NOTE — Progress Notes (Signed)
U/S(28+0wks)- breech active female fetus, approp growth EFW 2 lb 12 oz (61st%tile), fluid wnl, post gr 1 plac, cx long and closed, FHR=140 bpm

## 2013-05-08 NOTE — Progress Notes (Signed)
Pt here today for routine visit. Pt had PN2 blood work without the sugar test. Pt denies any problems or concerns at this time. wILL CHECK TSH MOST cbg's under 140, but fasting this am 112, will check hs cbg this week. Increase activity walking.

## 2013-05-08 NOTE — Addendum Note (Signed)
Addended by: Criss Alvine on: 05/08/2013 09:00 AM   Modules accepted: Orders

## 2013-05-08 NOTE — Progress Notes (Signed)
FOR PN2, U/S TODAY.

## 2013-05-11 LAB — HSV 2 ANTIBODY, IGG: HSV 2 Glycoprotein G Ab, IgG: 8.4 IV — ABNORMAL HIGH

## 2013-05-13 NOTE — Progress Notes (Signed)
TSH 1.6, will continue on synthroid 100 mcg/d

## 2013-05-22 ENCOUNTER — Ambulatory Visit (INDEPENDENT_AMBULATORY_CARE_PROVIDER_SITE_OTHER): Payer: BC Managed Care – PPO | Admitting: Obstetrics & Gynecology

## 2013-05-22 ENCOUNTER — Encounter: Payer: Self-pay | Admitting: Obstetrics & Gynecology

## 2013-05-22 VITALS — BP 110/80 | Wt 210.0 lb

## 2013-05-22 DIAGNOSIS — O0993 Supervision of high risk pregnancy, unspecified, third trimester: Secondary | ICD-10-CM

## 2013-05-22 DIAGNOSIS — Z331 Pregnant state, incidental: Secondary | ICD-10-CM

## 2013-05-22 DIAGNOSIS — O24919 Unspecified diabetes mellitus in pregnancy, unspecified trimester: Secondary | ICD-10-CM

## 2013-05-22 DIAGNOSIS — Z1389 Encounter for screening for other disorder: Secondary | ICD-10-CM

## 2013-05-22 DIAGNOSIS — O24913 Unspecified diabetes mellitus in pregnancy, third trimester: Secondary | ICD-10-CM

## 2013-05-22 LAB — POCT URINALYSIS DIPSTICK
Leukocytes, UA: NEGATIVE
Nitrite, UA: NEGATIVE
Protein, UA: NEGATIVE

## 2013-05-22 MED ORDER — INSULIN GLARGINE 100 UNIT/ML CARTRIDGE (FOR OPTICLICK): 20.0000 [IU] | Freq: Every day | SUBCUTANEOUS | Status: DC

## 2013-05-22 MED ORDER — METFORMIN HCL 500 MG PO TABS: 1000.0000 mg | ORAL_TABLET | Freq: Two times a day (BID) | ORAL | Status: DC

## 2013-05-22 NOTE — Progress Notes (Signed)
Blood sugar control is suboptimal.  On Metformin 1000 mg BID, will add Lantus 20 units at night Start twice weekly testing in 2 weeks, starting with a growth scan, BPP BP weight and urine results all reviewed and noted. Patient reports good fetal movement, denies any bleeding and no rupture of membranes symptoms or regular contractions. Patient is without complaints. All questions were answered.

## 2013-05-22 NOTE — Progress Notes (Signed)
110/80

## 2013-05-25 ENCOUNTER — Other Ambulatory Visit: Payer: Self-pay | Admitting: Obstetrics & Gynecology

## 2013-06-05 ENCOUNTER — Encounter: Payer: Self-pay | Admitting: Obstetrics & Gynecology

## 2013-06-05 ENCOUNTER — Ambulatory Visit (INDEPENDENT_AMBULATORY_CARE_PROVIDER_SITE_OTHER): Payer: BC Managed Care – PPO | Admitting: Obstetrics & Gynecology

## 2013-06-05 ENCOUNTER — Other Ambulatory Visit: Payer: Self-pay | Admitting: Obstetrics & Gynecology

## 2013-06-05 ENCOUNTER — Ambulatory Visit (INDEPENDENT_AMBULATORY_CARE_PROVIDER_SITE_OTHER): Payer: BC Managed Care – PPO

## 2013-06-05 VITALS — BP 120/80 | Wt 215.0 lb

## 2013-06-05 DIAGNOSIS — O24919 Unspecified diabetes mellitus in pregnancy, unspecified trimester: Secondary | ICD-10-CM

## 2013-06-05 DIAGNOSIS — Z1389 Encounter for screening for other disorder: Secondary | ICD-10-CM

## 2013-06-05 DIAGNOSIS — O24913 Unspecified diabetes mellitus in pregnancy, third trimester: Secondary | ICD-10-CM

## 2013-06-05 DIAGNOSIS — O99019 Anemia complicating pregnancy, unspecified trimester: Secondary | ICD-10-CM

## 2013-06-05 LAB — POCT URINALYSIS DIPSTICK
Glucose, UA: NEGATIVE
Ketones, UA: NEGATIVE
Leukocytes, UA: NEGATIVE
Protein, UA: NEGATIVE

## 2013-06-05 NOTE — Progress Notes (Signed)
U/S(32+0wks)-vtx active fetus, approp growth EFW 4 lb 12 oz (65th%tile), fluid wnl AFI-11.3cm, post gr 1 plac, BPP 8/8, female fetus

## 2013-06-05 NOTE — Patient Instructions (Signed)
Labor Induction  Most women go into labor on their own between 37 and 42 weeks of the pregnancy. When this does not happen or when there is a medical need, medicine or other methods may be used to induce labor. Labor induction causes a pregnant woman's uterus to contract. It also causes the cervix to soften (ripen), open (dilate), and thin out (efface). Usually, labor is not induced before 39 weeks of the pregnancy unless there is a problem with the baby or mother. Whether your labor will be induced depends on a number of factors, including the following:  The medical condition of you and the baby.  How many weeks along you are.  The status of baby's lung maturity.  The condition of the cervix.  The position of the baby. REASONS FOR LABOR INDUCTION  The health of the baby or mother is at risk.  The pregnancy is overdue by 1 week or more.  The water breaks but labor does not start on its own.  The mother has a health condition or serious illness such as high blood pressure, infection, placental abruption, or diabetes.  The amniotic fluid amounts are low around the baby.  The baby is distressed. REASONS TO NOT INDUCE LABOR Labor induction may not be a good idea if:  It is shown that your baby does not tolerate labor.  An induction is just more convenient.  You want the baby to be born on a certain date, like a holiday.  You have had previous surgeries on your uterus, such as a myomectomy or the removal of fibroids.  Your placenta lies very low in the uterus and blocks the opening of the cervix (placenta previa).  Your baby is not in a head down position.  The umbilical cord drops down into the birth canal in front of the baby. This could cut off the baby's blood and oxygen supply.  You have had a previous cesarean delivery.  There areunusual circumstances, such as the baby being extremely premature. RISKS AND COMPLICATIONS Problems may occur in the process of induction  and plans may need to be modified as a situation unfolds. Some of the risks of induction include:  Change in fetal heart rate, such as too high, too low, or erratic.  Risk of fetal distress.  Risk of infection to mother and baby.  Increased chance of having a cesarean delivery.  The rare, but increased chance that the placenta will separate from the uterus (abruption).  Uterine rupture (very rare). When induction is needed for medical reasons, the benefits of induction may outweigh the risks. BEFORE THE PROCEDURE Your caregiver will check your cervix and the baby's position. This will help your caregiver decide if you are far enough along for an induction to work. PROCEDURE Several methods of labor induction may be used, such as:   Taking prostaglandin medicine to dilate and ripen the cervix. The medicine will also start contractions. It can be taken by mouth or by inserting a suppository into the vagina.  A thin tube (catheter) with a balloon on the end may be inserted into your vagina to dilate the cervix. Once inserted, the balloon expands with water, which causes the cervix to open.  Striping the membranes. Your caregiver inserts a finger between the cervix and membranes, which causes the cervix to be stretched and may cause the uterus to contract. This is often done during an office visit. You will be sent home to wait for the contractions to begin. You will   then come in for an induction.  Breaking the water. Your caregiver will make a hole in the amniotic sac using a small instrument. Once the amniotic sac breaks, contractions should begin. This may still take hours to see an effect.  Taking medicine to trigger or strengthen contractions. This medicine is given intravenously through a tube in your arm. All of the methods of induction, besides stripping the membranes, will be done in the hospital. Induction is done in the hospital so that you and the baby can be carefully  monitored. AFTER THE PROCEDURE Some inductions can take up to 2 or 3 days. Depending on the cervix, it usually takes less time. It takes longer when you are induced early in the pregnancy or if this is your first pregnancy. If a mother is still pregnant and the induction has been going on for 2 to 3 days, either the mother will be sent home or a cesarean delivery will be needed. Document Released: 02/13/2007 Document Revised: 12/17/2011 Document Reviewed: 07/30/2011 [employer] Patient Information 2014 [employer].  

## 2013-06-05 NOTE — Progress Notes (Deleted)
Follow-Up U/S.

## 2013-06-05 NOTE — Progress Notes (Signed)
Sonogram noted, normal see report Blood sugars ok but will increase lantus to 30 units nightly BP weight and urine results all reviewed and noted. Patient reports good fetal movement, denies any bleeding and no rupture of membranes symptoms or regular contractions. Patient is without complaints. All questions were answered. NST next thursday

## 2013-06-11 ENCOUNTER — Ambulatory Visit (INDEPENDENT_AMBULATORY_CARE_PROVIDER_SITE_OTHER): Payer: BC Managed Care – PPO | Admitting: Obstetrics & Gynecology

## 2013-06-11 ENCOUNTER — Other Ambulatory Visit: Payer: BC Managed Care – PPO

## 2013-06-11 ENCOUNTER — Encounter: Payer: Self-pay | Admitting: Obstetrics & Gynecology

## 2013-06-11 VITALS — BP 110/80 | Wt 216.0 lb

## 2013-06-11 DIAGNOSIS — O99019 Anemia complicating pregnancy, unspecified trimester: Secondary | ICD-10-CM

## 2013-06-11 DIAGNOSIS — O0993 Supervision of high risk pregnancy, unspecified, third trimester: Secondary | ICD-10-CM

## 2013-06-11 DIAGNOSIS — Z1389 Encounter for screening for other disorder: Secondary | ICD-10-CM

## 2013-06-11 DIAGNOSIS — O24913 Unspecified diabetes mellitus in pregnancy, third trimester: Secondary | ICD-10-CM

## 2013-06-11 DIAGNOSIS — O24919 Unspecified diabetes mellitus in pregnancy, unspecified trimester: Secondary | ICD-10-CM

## 2013-06-11 DIAGNOSIS — Z331 Pregnant state, incidental: Secondary | ICD-10-CM

## 2013-06-11 LAB — POCT URINALYSIS DIPSTICK
Glucose, UA: NEGATIVE
Nitrite, UA: NEGATIVE

## 2013-06-11 NOTE — Progress Notes (Signed)
Reactive NST Blood sugars are good on Lantus 30 units and Metformin 1000 BID BP weight and urine results all reviewed and noted. Patient reports good fetal movement, denies any bleeding and no rupture of membranes symptoms or regular contractions. Patient is without complaints. All questions were answered.

## 2013-06-11 NOTE — Progress Notes (Signed)
For NST Today. 

## 2013-06-11 NOTE — Patient Instructions (Signed)

## 2013-06-15 ENCOUNTER — Encounter: Payer: Self-pay | Admitting: Obstetrics & Gynecology

## 2013-06-15 ENCOUNTER — Ambulatory Visit (INDEPENDENT_AMBULATORY_CARE_PROVIDER_SITE_OTHER): Payer: BC Managed Care – PPO | Admitting: Obstetrics & Gynecology

## 2013-06-15 VITALS — BP 110/70 | Wt 217.0 lb

## 2013-06-15 DIAGNOSIS — O24419 Gestational diabetes mellitus in pregnancy, unspecified control: Secondary | ICD-10-CM

## 2013-06-15 DIAGNOSIS — O24919 Unspecified diabetes mellitus in pregnancy, unspecified trimester: Secondary | ICD-10-CM

## 2013-06-15 DIAGNOSIS — Z1389 Encounter for screening for other disorder: Secondary | ICD-10-CM

## 2013-06-15 DIAGNOSIS — O99019 Anemia complicating pregnancy, unspecified trimester: Secondary | ICD-10-CM

## 2013-06-15 LAB — POCT URINALYSIS DIPSTICK
Leukocytes, UA: NEGATIVE
Protein, UA: NEGATIVE

## 2013-06-15 NOTE — Progress Notes (Signed)
Reactive NST BP weight and urine results all reviewed and noted. Patient reports good fetal movement, denies any bleeding and no rupture of membranes symptoms or regular contractions. Patient is without complaints. All questions were answered.  

## 2013-06-15 NOTE — Progress Notes (Signed)
For NST

## 2013-06-18 ENCOUNTER — Ambulatory Visit (INDEPENDENT_AMBULATORY_CARE_PROVIDER_SITE_OTHER): Payer: BC Managed Care – PPO | Admitting: Obstetrics & Gynecology

## 2013-06-18 ENCOUNTER — Other Ambulatory Visit: Payer: BC Managed Care – PPO

## 2013-06-18 VITALS — BP 120/72 | Wt 216.0 lb

## 2013-06-18 DIAGNOSIS — O99019 Anemia complicating pregnancy, unspecified trimester: Secondary | ICD-10-CM

## 2013-06-18 DIAGNOSIS — O24919 Unspecified diabetes mellitus in pregnancy, unspecified trimester: Secondary | ICD-10-CM

## 2013-06-18 DIAGNOSIS — Z331 Pregnant state, incidental: Secondary | ICD-10-CM

## 2013-06-18 DIAGNOSIS — Z1389 Encounter for screening for other disorder: Secondary | ICD-10-CM

## 2013-06-18 LAB — POCT URINALYSIS DIPSTICK
Blood, UA: NEGATIVE
Ketones, UA: NEGATIVE

## 2013-06-18 NOTE — Progress Notes (Signed)
No complaints at this time. NST today. 

## 2013-06-18 NOTE — Progress Notes (Signed)
Reactive NST today 

## 2013-06-22 ENCOUNTER — Other Ambulatory Visit (HOSPITAL_COMMUNITY): Payer: Self-pay | Admitting: Internal Medicine

## 2013-06-22 ENCOUNTER — Ambulatory Visit (INDEPENDENT_AMBULATORY_CARE_PROVIDER_SITE_OTHER): Payer: BC Managed Care – PPO | Admitting: Obstetrics & Gynecology

## 2013-06-22 ENCOUNTER — Encounter: Payer: Self-pay | Admitting: Obstetrics & Gynecology

## 2013-06-22 VITALS — BP 120/80 | Wt 221.0 lb

## 2013-06-22 DIAGNOSIS — Z331 Pregnant state, incidental: Secondary | ICD-10-CM

## 2013-06-22 DIAGNOSIS — Q2381 Bicuspid aortic valve: Secondary | ICD-10-CM

## 2013-06-22 DIAGNOSIS — O24913 Unspecified diabetes mellitus in pregnancy, third trimester: Secondary | ICD-10-CM

## 2013-06-22 DIAGNOSIS — Q231 Congenital insufficiency of aortic valve: Secondary | ICD-10-CM

## 2013-06-22 DIAGNOSIS — O24919 Unspecified diabetes mellitus in pregnancy, unspecified trimester: Secondary | ICD-10-CM

## 2013-06-22 DIAGNOSIS — Z1389 Encounter for screening for other disorder: Secondary | ICD-10-CM

## 2013-06-22 DIAGNOSIS — O99019 Anemia complicating pregnancy, unspecified trimester: Secondary | ICD-10-CM

## 2013-06-22 LAB — POCT URINALYSIS DIPSTICK
Blood, UA: NEGATIVE
Nitrite, UA: NEGATIVE

## 2013-06-22 NOTE — Progress Notes (Signed)
For NST Today.Also vaginal itching, clear discharge.

## 2013-06-22 NOTE — Addendum Note (Signed)
Addended by: Criss Alvine on: 06/22/2013 03:22 PM   Modules accepted: Orders

## 2013-06-22 NOTE — Progress Notes (Signed)
Reactive NST Blood sugars are excellent BP weight and urine results all reviewed and noted. Patient reports good fetal movement, denies any bleeding and no rupture of membranes symptoms or regular contractions. Patient is without complaints. All questions were answered.

## 2013-06-23 ENCOUNTER — Telehealth: Payer: Self-pay | Admitting: Internal Medicine

## 2013-06-23 NOTE — Telephone Encounter (Signed)
Check with prior-authorization to see why they won't pay for the echo. She has a biscuspid valve with aortic stenosis. Recommended follow-up is every 1-3 years. He last was 1 year ago.  -Dr. Rennis Golden

## 2013-06-23 NOTE — Telephone Encounter (Signed)
Message forwarded to Dr. Hilty/Jenna, RN. 

## 2013-06-23 NOTE — Telephone Encounter (Signed)
Dr.Hilty wants her to have an echo and her insurance will not pay for it and she wants to know should she still keep her appt with the Dr.Hilty , she does not know the insurance will not pay for th echo .Marland Kitchen Please Call    Thanks

## 2013-06-23 NOTE — Telephone Encounter (Signed)
Miranda [name] - can you check on a pre-auth for this patient's echo for me?

## 2013-06-24 NOTE — Telephone Encounter (Signed)
Correct. This patient's insurance requires that we wait 1 year exactly before ordering a follow up Echo. Her previous Echo was performed on 07/24/12, therefore we must wait until 07/24/13 to order her next Echo, unless she is symptomatic. I will attempt to reach the patient today to clarify this information.

## 2013-06-24 NOTE — Telephone Encounter (Signed)
Called patient with recommendations per Dr. Rennis Golden to have echo and OV after she has her baby. Patient verbalized understanding and will re-schedule OV and echo at a later date.

## 2013-06-24 NOTE — Telephone Encounter (Signed)
I'd rather her have the echo after she has the baby and then follow-up with me.  Thanks.  Dr. Rennis Golden

## 2013-06-24 NOTE — Telephone Encounter (Signed)
I spoke with the patient this morning and she understands that her test must be scheduled 1 year from her previous test (07/24/12), per her insurance.  However, she is due to give birth prior to 10/17 and would like to know if Dr. Rennis Golden wants her to have the Echo prior to giving birth or if it is ok to wait. She would also like to know if she should keep her office visit with Dr. Rennis Golden scheduled for Monday 9/22 or if this should be scheduled following her Echo. Please check with Dr. Rennis Golden and notify the patient. Let me know if you need additional information regarding pre-authorization for this case.

## 2013-06-24 NOTE — Telephone Encounter (Signed)
Dr Rennis Golden - see Kim's note - patient wants to know if needs to keep OV on Monday if can't have echo until after 10/17 per insurance?

## 2013-06-25 ENCOUNTER — Ambulatory Visit (HOSPITAL_COMMUNITY): Payer: BC Managed Care – PPO

## 2013-06-26 ENCOUNTER — Encounter: Payer: Self-pay | Admitting: Obstetrics & Gynecology

## 2013-06-26 ENCOUNTER — Ambulatory Visit (INDEPENDENT_AMBULATORY_CARE_PROVIDER_SITE_OTHER): Payer: BC Managed Care – PPO | Admitting: Obstetrics & Gynecology

## 2013-06-26 VITALS — BP 110/84 | Wt 221.5 lb

## 2013-06-26 DIAGNOSIS — Z331 Pregnant state, incidental: Secondary | ICD-10-CM

## 2013-06-26 DIAGNOSIS — O0993 Supervision of high risk pregnancy, unspecified, third trimester: Secondary | ICD-10-CM

## 2013-06-26 DIAGNOSIS — O24919 Unspecified diabetes mellitus in pregnancy, unspecified trimester: Secondary | ICD-10-CM

## 2013-06-26 DIAGNOSIS — Z1389 Encounter for screening for other disorder: Secondary | ICD-10-CM

## 2013-06-26 DIAGNOSIS — O24913 Unspecified diabetes mellitus in pregnancy, third trimester: Secondary | ICD-10-CM

## 2013-06-26 DIAGNOSIS — O99019 Anemia complicating pregnancy, unspecified trimester: Secondary | ICD-10-CM

## 2013-06-26 LAB — POCT URINALYSIS DIPSTICK
Blood, UA: NEGATIVE
Glucose, UA: NEGATIVE
Ketones, UA: NEGATIVE

## 2013-06-26 MED ORDER — ACYCLOVIR 400 MG PO TABS: 400.0000 mg | ORAL_TABLET | Freq: Three times a day (TID) | ORAL | Status: DC

## 2013-06-26 NOTE — Progress Notes (Signed)
Reactive NST, blood sugars excellent BP weight and urine results all reviewed and noted. Patient reports good fetal movement, denies any bleeding and no rupture of membranes symptoms or regular contractions. Patient is without complaints. All questions were answered.

## 2013-06-29 ENCOUNTER — Ambulatory Visit: Payer: BC Managed Care – PPO | Admitting: Internal Medicine

## 2013-06-30 ENCOUNTER — Ambulatory Visit (INDEPENDENT_AMBULATORY_CARE_PROVIDER_SITE_OTHER): Payer: BC Managed Care – PPO | Admitting: Women's Health

## 2013-06-30 VITALS — BP 120/78 | Wt 226.0 lb

## 2013-06-30 DIAGNOSIS — Z1389 Encounter for screening for other disorder: Secondary | ICD-10-CM

## 2013-06-30 DIAGNOSIS — O0993 Supervision of high risk pregnancy, unspecified, third trimester: Secondary | ICD-10-CM

## 2013-06-30 DIAGNOSIS — E039 Hypothyroidism, unspecified: Secondary | ICD-10-CM

## 2013-06-30 DIAGNOSIS — O24913 Unspecified diabetes mellitus in pregnancy, third trimester: Secondary | ICD-10-CM

## 2013-06-30 DIAGNOSIS — O99019 Anemia complicating pregnancy, unspecified trimester: Secondary | ICD-10-CM

## 2013-06-30 DIAGNOSIS — O24919 Unspecified diabetes mellitus in pregnancy, unspecified trimester: Secondary | ICD-10-CM

## 2013-06-30 DIAGNOSIS — Z331 Pregnant state, incidental: Secondary | ICD-10-CM

## 2013-06-30 LAB — POCT URINALYSIS DIPSTICK
Ketones, UA: NEGATIVE
Protein, UA: NEGATIVE

## 2013-06-30 NOTE — Patient Instructions (Signed)
[facility] [phone]          [facility] [phone]               [facility] [phone]   Preterm Labor Preterm labor is when labor starts at less than 37 weeks of pregnancy. The normal length of a pregnancy is 39 to 41 weeks. CAUSES Often, there is no identifiable underlying cause as to why a woman goes into preterm labor. However, one of the most common known causes of preterm labor is infection. Infections of the uterus, cervix, vagina, amniotic sac, bladder, kidney, or even the lungs (pneumonia) can cause labor to start. Other causes of preterm labor include:  Urogenital infections, such as yeast infections and bacterial vaginosis.  Uterine abnormalities (uterine shape, uterine septum, fibroids, bleeding from the placenta).  A cervix that has been operated on and opens prematurely.  Malformations in the baby.  Multiple gestations (twins, triplets, and so on).  Breakage of the amniotic sac. Additional risk factors for preterm labor include:  Previous history of preterm labor.  Premature rupture of membranes (PROM).  A placenta that covers the opening of the cervix (placenta previa).  A placenta that separates from the uterus (placenta abruption).  A cervix that is too weak to hold the baby in the uterus (incompetence cervix).  Having too much fluid in the amniotic sac (polyhydramnios).  Taking illegal drugs or smoking while pregnant.  Not gaining enough weight while pregnant.  Women younger than 74 and older than 33 years old.  Low socioeconomic status.  African-American ethnicity. SYMPTOMS Signs and symptoms of preterm labor include:  Menstrual-like cramps.  Contractions that are 30 to 70 seconds apart, become very regular, closer together, and are more intense and painful.  Contractions that start on the top of the uterus and spread down to the lower abdomen and back.  A sense of increased pelvic  pressure or back pain.  A watery or bloody discharge that comes from the vagina. DIAGNOSIS  A diagnosis can be confirmed by:  A vaginal exam.  An ultrasound of the cervix.  Sampling (swabbing) cervico-vaginal secretions. These samples can be tested for the presence of fetal fibronectin. This is a protein found in cervical discharge which is associated with preterm labor.  Fetal monitoring. TREATMENT  Depending on the length of the pregnancy and other circumstances, a caregiver may suggest bed rest. If necessary, there are medicines that can be given to stop contractions and to quicken fetal lung maturity. If labor happens before 34 weeks of pregnancy, a prolonged hospital stay may be recommended. Treatment depends on the condition of both the mother and baby. PREVENTION There are some things a mother can do to lower the risk of preterm labor in future pregnancies. A woman can:   Stop smoking.  Maintain healthy weight gain and avoid chemicals and drugs that are not necessary.  Be watchful for any type of infection.  Inform her caregiver if she has a known history of preterm labor. Document Released: 12/15/2003 Document Revised: 12/17/2011 Document Reviewed: 01/19/2011 [facility] Patient Information 2014 [place], [place].

## 2013-06-30 NOTE — Progress Notes (Signed)
Reports good fm. Denies uc's, lof, vb, urinary frequency, urgency, hesitancy, or dysuria.  No complaints.  Reviewed ptl s/s, fkc.  Reactive nst today. Fasting cbg's 80s-90. 2hr pp 120s. States she is eating more carbs than she should, has gained 4.5lbs in 4 days. Reviewed importance of carb restriction, states she will do better b/w now and frid. All questions answered. F/U in 3d for growth/afi/bpp u/s and visit.

## 2013-06-30 NOTE — Progress Notes (Signed)
No complaints noted at this time. NST today

## 2013-07-03 ENCOUNTER — Ambulatory Visit (INDEPENDENT_AMBULATORY_CARE_PROVIDER_SITE_OTHER): Payer: BC Managed Care – PPO | Admitting: Obstetrics & Gynecology

## 2013-07-03 ENCOUNTER — Encounter: Payer: Self-pay | Admitting: Obstetrics & Gynecology

## 2013-07-03 ENCOUNTER — Ambulatory Visit (INDEPENDENT_AMBULATORY_CARE_PROVIDER_SITE_OTHER): Payer: BC Managed Care – PPO

## 2013-07-03 ENCOUNTER — Other Ambulatory Visit: Payer: Self-pay | Admitting: Obstetrics & Gynecology

## 2013-07-03 VITALS — BP 120/80 | Wt 222.0 lb

## 2013-07-03 DIAGNOSIS — O24913 Unspecified diabetes mellitus in pregnancy, third trimester: Secondary | ICD-10-CM

## 2013-07-03 DIAGNOSIS — O24919 Unspecified diabetes mellitus in pregnancy, unspecified trimester: Secondary | ICD-10-CM

## 2013-07-03 DIAGNOSIS — Z1389 Encounter for screening for other disorder: Secondary | ICD-10-CM

## 2013-07-03 DIAGNOSIS — Z331 Pregnant state, incidental: Secondary | ICD-10-CM

## 2013-07-03 DIAGNOSIS — O99019 Anemia complicating pregnancy, unspecified trimester: Secondary | ICD-10-CM

## 2013-07-03 LAB — POCT URINALYSIS DIPSTICK
Leukocytes, UA: NEGATIVE
Nitrite, UA: NEGATIVE
Protein, UA: NEGATIVE

## 2013-07-03 NOTE — Progress Notes (Signed)
U/S(36+0wks)-vtx active fetus BPP 8/8, approp. Growth EFW 6 lb 7 oz (55Th%tile), fluid wnl AFI-13.8cm, post gr 1 plac, female fetus

## 2013-07-03 NOTE — Progress Notes (Signed)
Had U/S Today. 

## 2013-07-03 NOTE — Progress Notes (Signed)
Sonogram excellent 55%tile growth BP weight and urine results all reviewed and noted. Patient reports good fetal movement, denies any bleeding and no rupture of membranes symptoms or regular contractions. Patient is without complaints. All questions were answered.

## 2013-07-04 ENCOUNTER — Encounter: Payer: Self-pay | Admitting: Women's Health

## 2013-07-07 ENCOUNTER — Other Ambulatory Visit: Payer: BC Managed Care – PPO | Admitting: Obstetrics & Gynecology

## 2013-07-07 ENCOUNTER — Ambulatory Visit (INDEPENDENT_AMBULATORY_CARE_PROVIDER_SITE_OTHER): Payer: BC Managed Care – PPO | Admitting: Advanced Practice Midwife

## 2013-07-07 VITALS — BP 112/70 | Wt 224.0 lb

## 2013-07-07 DIAGNOSIS — O99019 Anemia complicating pregnancy, unspecified trimester: Secondary | ICD-10-CM

## 2013-07-07 DIAGNOSIS — O24913 Unspecified diabetes mellitus in pregnancy, third trimester: Secondary | ICD-10-CM

## 2013-07-07 DIAGNOSIS — O24919 Unspecified diabetes mellitus in pregnancy, unspecified trimester: Secondary | ICD-10-CM

## 2013-07-07 MED ORDER — INSULIN GLARGINE 100 UNIT/ML CARTRIDGE (FOR OPTICLICK): 30.0000 [IU] | Freq: Every day | SUBCUTANEOUS | Status: DC

## 2013-07-07 NOTE — Progress Notes (Signed)
Reactive NST. Blood sugars excellent on Metformin 1000 mg BID and Lantus 30u  Qhs.    No c/o at this time except some lower back pain when standing.  may try heat or ice /maternity belt if it becomes too bothersome.  Routine questions about pregnancy answered.  F/U in Friday for  NST.

## 2013-07-10 ENCOUNTER — Ambulatory Visit (INDEPENDENT_AMBULATORY_CARE_PROVIDER_SITE_OTHER): Payer: BC Managed Care – PPO | Admitting: Obstetrics & Gynecology

## 2013-07-10 ENCOUNTER — Encounter: Payer: Self-pay | Admitting: Obstetrics & Gynecology

## 2013-07-10 ENCOUNTER — Telehealth (HOSPITAL_COMMUNITY): Payer: Self-pay | Admitting: *Deleted

## 2013-07-10 VITALS — BP 110/82 | Wt 224.0 lb

## 2013-07-10 DIAGNOSIS — O24913 Unspecified diabetes mellitus in pregnancy, third trimester: Secondary | ICD-10-CM

## 2013-07-10 DIAGNOSIS — O99019 Anemia complicating pregnancy, unspecified trimester: Secondary | ICD-10-CM

## 2013-07-10 DIAGNOSIS — O24919 Unspecified diabetes mellitus in pregnancy, unspecified trimester: Secondary | ICD-10-CM

## 2013-07-10 DIAGNOSIS — O0993 Supervision of high risk pregnancy, unspecified, third trimester: Secondary | ICD-10-CM

## 2013-07-10 DIAGNOSIS — Z331 Pregnant state, incidental: Secondary | ICD-10-CM

## 2013-07-10 DIAGNOSIS — Z1389 Encounter for screening for other disorder: Secondary | ICD-10-CM

## 2013-07-10 LAB — POCT URINALYSIS DIPSTICK
Blood, UA: NEGATIVE
Nitrite, UA: NEGATIVE
Protein, UA: NEGATIVE

## 2013-07-10 LAB — OB RESULTS CONSOLE GC/CHLAMYDIA
Chlamydia: NEGATIVE
Gonorrhea: NEGATIVE

## 2013-07-10 NOTE — Progress Notes (Signed)
Blood sugars are excellent Reactive NST BP weight and urine results all reviewed and noted. Patient reports good fetal movement, denies any bleeding and no rupture of membranes symptoms or regular contractions. Patient is without complaints. All questions were answered. Continue twice weekly , induce at 39 weeks

## 2013-07-11 LAB — GC/CHLAMYDIA PROBE AMP: GC Probe RNA: NEGATIVE

## 2013-07-14 ENCOUNTER — Encounter: Payer: Self-pay | Admitting: Women's Health

## 2013-07-14 ENCOUNTER — Ambulatory Visit (INDEPENDENT_AMBULATORY_CARE_PROVIDER_SITE_OTHER): Payer: BC Managed Care – PPO | Admitting: Women's Health

## 2013-07-14 VITALS — BP 122/88 | Temp 98.3°F | Wt 221.0 lb

## 2013-07-14 DIAGNOSIS — Z331 Pregnant state, incidental: Secondary | ICD-10-CM

## 2013-07-14 DIAGNOSIS — O24919 Unspecified diabetes mellitus in pregnancy, unspecified trimester: Secondary | ICD-10-CM

## 2013-07-14 DIAGNOSIS — Z23 Encounter for immunization: Secondary | ICD-10-CM

## 2013-07-14 DIAGNOSIS — Z1389 Encounter for screening for other disorder: Secondary | ICD-10-CM

## 2013-07-14 DIAGNOSIS — O0993 Supervision of high risk pregnancy, unspecified, third trimester: Secondary | ICD-10-CM

## 2013-07-14 DIAGNOSIS — O99019 Anemia complicating pregnancy, unspecified trimester: Secondary | ICD-10-CM

## 2013-07-14 LAB — POCT URINALYSIS DIPSTICK
Blood, UA: NEGATIVE
Glucose, UA: NEGATIVE
Ketones, UA: NEGATIVE
Nitrite, UA: NEGATIVE

## 2013-07-14 MED ORDER — INFLUENZA VAC SPLIT QUAD 0.5 ML IM SUSP
0.5000 mL | Freq: Once | INTRAMUSCULAR | Status: AC
  Administered 2013-07-14: 0.5 mL via INTRAMUSCULAR

## 2013-07-14 NOTE — Progress Notes (Signed)
Reports somewhat decreased fm over last few days. NST reactive.  Denies uc's, lof, vb, urinary frequency, urgency, hesitancy, or dysuria.  Woke up feeling like she may have a sinus infection/pressure, but has gone away. Sinuses non-tender to palpation.  Did not bring cbg log, reports fastings 87, 84, 91, 2hr pp 119, 11, 124. To continue current regimen. Reviewed labor s/s, fkc.  All questions answered. F/U in 3d for nst and visit.

## 2013-07-14 NOTE — Patient Instructions (Signed)

## 2013-07-17 ENCOUNTER — Ambulatory Visit (INDEPENDENT_AMBULATORY_CARE_PROVIDER_SITE_OTHER): Payer: BC Managed Care – PPO | Admitting: Obstetrics & Gynecology

## 2013-07-17 ENCOUNTER — Encounter: Payer: Self-pay | Admitting: Obstetrics & Gynecology

## 2013-07-17 VITALS — BP 120/80 | Wt 222.0 lb

## 2013-07-17 DIAGNOSIS — O99891 Other specified diseases and conditions complicating pregnancy: Secondary | ICD-10-CM

## 2013-07-17 DIAGNOSIS — O24913 Unspecified diabetes mellitus in pregnancy, third trimester: Secondary | ICD-10-CM

## 2013-07-17 DIAGNOSIS — O99019 Anemia complicating pregnancy, unspecified trimester: Secondary | ICD-10-CM

## 2013-07-17 DIAGNOSIS — O0993 Supervision of high risk pregnancy, unspecified, third trimester: Secondary | ICD-10-CM

## 2013-07-17 DIAGNOSIS — Z1389 Encounter for screening for other disorder: Secondary | ICD-10-CM

## 2013-07-17 DIAGNOSIS — Z331 Pregnant state, incidental: Secondary | ICD-10-CM

## 2013-07-17 DIAGNOSIS — O24919 Unspecified diabetes mellitus in pregnancy, unspecified trimester: Secondary | ICD-10-CM

## 2013-07-17 LAB — POCT URINALYSIS DIPSTICK
Glucose, UA: NEGATIVE
Ketones, UA: NEGATIVE
Nitrite, UA: NEGATIVE

## 2013-07-17 NOTE — Progress Notes (Signed)
For NST Today.

## 2013-07-17 NOTE — Progress Notes (Signed)
Reactive NST BP weight and urine results all reviewed and noted. Patient reports good fetal movement, denies any bleeding and no rupture of membranes symptoms or regular contractions. Patient is without complaints. All questions were answered.  

## 2013-07-20 ENCOUNTER — Encounter (HOSPITAL_COMMUNITY): Payer: Self-pay | Admitting: *Deleted

## 2013-07-20 ENCOUNTER — Telehealth (HOSPITAL_COMMUNITY): Payer: Self-pay | Admitting: *Deleted

## 2013-07-20 NOTE — Telephone Encounter (Signed)
Preadmission screen  

## 2013-07-21 ENCOUNTER — Ambulatory Visit (INDEPENDENT_AMBULATORY_CARE_PROVIDER_SITE_OTHER): Payer: BC Managed Care – PPO | Admitting: Obstetrics & Gynecology

## 2013-07-21 ENCOUNTER — Encounter: Payer: Self-pay | Admitting: Obstetrics & Gynecology

## 2013-07-21 VITALS — BP 130/80 | Wt 222.0 lb

## 2013-07-21 DIAGNOSIS — O99891 Other specified diseases and conditions complicating pregnancy: Secondary | ICD-10-CM

## 2013-07-21 DIAGNOSIS — O99330 Smoking (tobacco) complicating pregnancy, unspecified trimester: Secondary | ICD-10-CM

## 2013-07-21 DIAGNOSIS — Z1389 Encounter for screening for other disorder: Secondary | ICD-10-CM

## 2013-07-21 DIAGNOSIS — O24919 Unspecified diabetes mellitus in pregnancy, unspecified trimester: Secondary | ICD-10-CM

## 2013-07-21 DIAGNOSIS — O99019 Anemia complicating pregnancy, unspecified trimester: Secondary | ICD-10-CM

## 2013-07-21 DIAGNOSIS — O26849 Uterine size-date discrepancy, unspecified trimester: Secondary | ICD-10-CM

## 2013-07-21 DIAGNOSIS — O9989 Other specified diseases and conditions complicating pregnancy, childbirth and the puerperium: Secondary | ICD-10-CM

## 2013-07-21 DIAGNOSIS — E119 Type 2 diabetes mellitus without complications: Secondary | ICD-10-CM

## 2013-07-21 DIAGNOSIS — O98519 Other viral diseases complicating pregnancy, unspecified trimester: Secondary | ICD-10-CM

## 2013-07-21 DIAGNOSIS — O99280 Endocrine, nutritional and metabolic diseases complicating pregnancy, unspecified trimester: Secondary | ICD-10-CM

## 2013-07-21 DIAGNOSIS — E079 Disorder of thyroid, unspecified: Secondary | ICD-10-CM

## 2013-07-21 LAB — POCT URINALYSIS DIPSTICK
Blood, UA: NEGATIVE
Glucose, UA: NEGATIVE
Ketones, UA: NEGATIVE
Leukocytes, UA: NEGATIVE
Protein, UA: NEGATIVE

## 2013-07-21 NOTE — Progress Notes (Signed)
Very posterior cervix.  For induction of Friday 730 pm BP weight and urine results all reviewed and noted. Patient reports good fetal movement, denies any bleeding and no rupture of membranes symptoms or regular contractions. Patient is without complaints. All questions were answered.

## 2013-07-21 NOTE — Progress Notes (Signed)
For NST TODAY.C/C HEADACHE AND STOMACH PAIN.

## 2013-07-22 ENCOUNTER — Telehealth: Payer: Self-pay | Admitting: *Deleted

## 2013-07-22 NOTE — Telephone Encounter (Signed)
Pt states thinks she lost her mucus plug, no contractions at this time, + FM, no gush of fluids, pt state noted some bleeding when wiped today, however, pt was seen in our office yesterday by Dr. Despina Hidden and had a cervical check. Pt encouraged to monitor bleeding if increases, gush of fluids, contraction 5-10 minutes apart, decrease FM pt to go to [facility]. Pt states scheduled for induction this Friday. Pt verbalized understanding.

## 2013-07-24 ENCOUNTER — Encounter (HOSPITAL_COMMUNITY): Payer: Self-pay

## 2013-07-24 ENCOUNTER — Inpatient Hospital Stay (HOSPITAL_COMMUNITY)
Admission: RE | Admit: 2013-07-24 | Discharge: 2013-07-27 | DRG: 775 | Disposition: A | Discharge status: Home / Self Care | Payer: BC Managed Care – PPO | Source: Ambulatory Visit | Attending: Obstetrics and Gynecology | Admitting: Obstetrics and Gynecology

## 2013-07-24 VITALS — BP 119/82 | HR 69 | Temp 97.7°F | Resp 18 | Ht 68.0 in | Wt 222.0 lb

## 2013-07-24 DIAGNOSIS — Q2381 Bicuspid aortic valve: Secondary | ICD-10-CM

## 2013-07-24 DIAGNOSIS — Z22330 Carrier of Group B streptococcus: Secondary | ICD-10-CM

## 2013-07-24 DIAGNOSIS — E079 Disorder of thyroid, unspecified: Secondary | ICD-10-CM | POA: Diagnosis present

## 2013-07-24 DIAGNOSIS — R002 Palpitations: Secondary | ICD-10-CM

## 2013-07-24 DIAGNOSIS — O99892 Other specified diseases and conditions complicating childbirth: Secondary | ICD-10-CM | POA: Diagnosis present

## 2013-07-24 DIAGNOSIS — Q231 Congenital insufficiency of aortic valve: Secondary | ICD-10-CM

## 2013-07-24 DIAGNOSIS — O24913 Unspecified diabetes mellitus in pregnancy, third trimester: Secondary | ICD-10-CM

## 2013-07-24 DIAGNOSIS — E039 Hypothyroidism, unspecified: Secondary | ICD-10-CM

## 2013-07-24 DIAGNOSIS — O99814 Abnormal glucose complicating childbirth: Principal | ICD-10-CM | POA: Diagnosis present

## 2013-07-24 DIAGNOSIS — O0993 Supervision of high risk pregnancy, unspecified, third trimester: Secondary | ICD-10-CM

## 2013-07-24 LAB — CBC
HCT: 32.8 % — ABNORMAL LOW (ref 36.0–46.0)
Hemoglobin: 11.2 g/dL — ABNORMAL LOW (ref 12.0–15.0)
MCV: 83.2 fL (ref 78.0–100.0)
Platelets: 277 10*3/uL (ref 150–400)
RBC: 3.94 MIL/uL (ref 3.87–5.11)
WBC: 13.8 10*3/uL — ABNORMAL HIGH (ref 4.0–10.5)

## 2013-07-24 LAB — GLUCOSE, CAPILLARY: Glucose-Capillary: 87 mg/dL (ref 70–99)

## 2013-07-24 MED ORDER — LEVOTHYROXINE SODIUM 100 MCG PO TABS
100.0000 ug | ORAL_TABLET | Freq: Every day | ORAL | Status: DC
  Administered 2013-07-25 – 2013-07-27 (×3): 100 ug via ORAL
  Filled 2013-07-24 (×4): qty 1

## 2013-07-24 MED ORDER — OXYCODONE-ACETAMINOPHEN 5-325 MG PO TABS: 1.0000 | ORAL_TABLET | ORAL | Status: DC | PRN

## 2013-07-24 MED ORDER — LIDOCAINE HCL (PF) 1 % IJ SOLN
30.0000 mL | INTRAMUSCULAR | Status: DC | PRN
  Filled 2013-07-24 (×2): qty 30

## 2013-07-24 MED ORDER — CITRIC ACID-SODIUM CITRATE 334-500 MG/5ML PO SOLN: 30.0000 mL | ORAL | Status: DC | PRN

## 2013-07-24 MED ORDER — OXYTOCIN BOLUS FROM INFUSION: 500.0000 mL | INTRAVENOUS | Status: DC

## 2013-07-24 MED ORDER — OXYTOCIN 40 UNITS IN LACTATED RINGERS INFUSION - SIMPLE MED
62.5000 mL/h | INTRAVENOUS | Status: DC
  Administered 2013-07-26 (×2): 62.5 mL/h via INTRAVENOUS

## 2013-07-24 MED ORDER — INSULIN GLARGINE 100 UNIT/ML ~~LOC~~ SOLN
30.0000 [IU] | Freq: Every day | SUBCUTANEOUS | Status: DC
  Administered 2013-07-24: 30 [IU] via SUBCUTANEOUS
  Filled 2013-07-24 (×2): qty 0.3

## 2013-07-24 MED ORDER — ACETAMINOPHEN 325 MG PO TABS: 650.0000 mg | ORAL_TABLET | ORAL | Status: DC | PRN

## 2013-07-24 MED ORDER — ZOLPIDEM TARTRATE 5 MG PO TABS
5.0000 mg | ORAL_TABLET | Freq: Every evening | ORAL | Status: DC | PRN
  Administered 2013-07-24: 5 mg via ORAL
  Filled 2013-07-24: qty 1

## 2013-07-24 MED ORDER — METFORMIN HCL 500 MG PO TABS
1000.0000 mg | ORAL_TABLET | Freq: Two times a day (BID) | ORAL | Status: DC
  Administered 2013-07-25 – 2013-07-27 (×4): 1000 mg via ORAL
  Filled 2013-07-24 (×7): qty 2

## 2013-07-24 MED ORDER — LACTATED RINGERS IV SOLN
INTRAVENOUS | Status: DC
  Administered 2013-07-24: 125 mL/h via INTRAVENOUS
  Administered 2013-07-25 – 2013-07-26 (×4): via INTRAVENOUS

## 2013-07-24 MED ORDER — IBUPROFEN 600 MG PO TABS
600.0000 mg | ORAL_TABLET | Freq: Four times a day (QID) | ORAL | Status: DC | PRN
  Administered 2013-07-26: 600 mg via ORAL
  Filled 2013-07-24: qty 1

## 2013-07-24 MED ORDER — LACTATED RINGERS IV SOLN: 500.0000 mL | INTRAVENOUS | Status: DC | PRN

## 2013-07-24 MED ORDER — ONDANSETRON HCL 4 MG/2ML IJ SOLN
4.0000 mg | Freq: Four times a day (QID) | INTRAMUSCULAR | Status: DC | PRN
  Administered 2013-07-26: 4 mg via INTRAVENOUS
  Filled 2013-07-24: qty 2

## 2013-07-24 MED ORDER — FLEET ENEMA 7-19 GM/118ML RE ENEM: 1.0000 | ENEMA | RECTAL | Status: DC | PRN

## 2013-07-24 NOTE — Progress Notes (Signed)
Called provider to report pt's arrival.

## 2013-07-24 NOTE — H&P (Signed)
Miranda [name] is a 32 y.o. female G1 at 39.0wks presenting for IOL for GDM class B. Denies H/A, N/V/D, leaking or pain. 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 History OB History   Grav Para Term Preterm Abortions TAB SAB Ect Mult Living   1              Past Medical History  Diagnosis Date  . Thyroid disease   . Pregnant   . Heart valve problem   . Palpitation   . Chest pain 07/24/2012    2D Echo EF 60%-65% which shoewd a bicuspid aortic vavle with mild stenosis  . PVC (premature ventricular contraction) 07/24/2012    stress test which was normal with good excercise  . Diabetes mellitus     type 2 on insulin  . Herpes    Past Surgical History  Procedure Laterality Date  . No past surgeries     Family History: family history includes Congestive Heart Failure in her maternal grandmother; Diabetes in her brother and maternal grandmother; Hypertension in her mother. Social History:  reports that she quit smoking about 14 months ago. Her smoking use included Cigarettes. She has a 2.5 pack-year smoking history. She has never used smokeless tobacco. She reports that she does not drink alcohol or use illicit drugs.   Prenatal Transfer Tool  Maternal Diabetes: Yes:  Diabetes Type:  Insulin/Medication controlled Genetic Screening: Normal Maternal Ultrasounds/Referrals: Normal Fetal Ultrasounds or other Referrals:  None Maternal Substance Abuse:  No Significant Maternal Medications:  Meds include: Syntroid Other:  Metformin 1000mg  BID & Lantus 30u qhs Significant Maternal Lab Results:  Lab values include: Group B Strep positive Other Comments:  None  ROS  Dilation: 2 Effacement (%): 60 Station: -2 Exam by:: K.  Shaw,CNM Blood pressure 139/82, pulse 85, temperature 97.5 F (36.4 C), temperature source Oral, resp. rate 20, height 5\' 8"  (1.727 m), weight 100.699 kg (222 lb), last menstrual period 09/28/2012, unknown if currently breastfeeding. Exam Physical Exam  Constitutional: She is oriented to person, place, and time. She appears well-developed.  HENT:  Head: Normocephalic.  Neck: Normal range of motion.  Cardiovascular: Normal rate.   Respiratory: Effort normal.  GI:  EFW 135 +accels, no decels Rare ctx per toco  Genitourinary:  Cx 2/60/-2, mid to post position Cooks foley placed without difficulty + mod bldy show  Musculoskeletal: Normal range of motion.  Neurological: She is alert and oriented to person, place, and time.  Skin: Skin is warm and dry.  Psychiatric: She has a normal mood and affect. Her behavior is normal. Thought content normal.    Prenatal labs: ABO, Rh: A/Positive/-- (03/13 0000) Antibody: NEG (08/01 0900) Rubella: 5.21 (03/13 1035) RPR: NON REAC (08/01 0900)  HBsAg: Negative (03/13 0000)  HIV: NON REACTIVE (08/01 0900)  GBS: POSITIVE (10/03 1125)   Assessment/Plan: IUP at 39.0wks GDM- class B GBS positive Unfavorable cx  Admitted to [facility] Foley balloon placed as method of induction Continue Metformin and Lantus until active labor begins Plan on PCN with active labor   SHAW, KIMBERLY 07/24/2013, 9:30 PM

## 2013-07-25 ENCOUNTER — Inpatient Hospital Stay (HOSPITAL_COMMUNITY): Payer: BC Managed Care – PPO | Admitting: Anesthesiology

## 2013-07-25 ENCOUNTER — Encounter (HOSPITAL_COMMUNITY): Payer: BC Managed Care – PPO | Admitting: Anesthesiology

## 2013-07-25 LAB — GLUCOSE, CAPILLARY
Glucose-Capillary: 75 mg/dL (ref 70–99)
Glucose-Capillary: 74 mg/dL (ref 70–99)
Glucose-Capillary: 69 mg/dL — ABNORMAL LOW (ref 70–99)

## 2013-07-25 LAB — RPR: RPR Ser Ql: NONREACTIVE

## 2013-07-25 MED ORDER — PENICILLIN G POTASSIUM 5000000 UNITS IJ SOLR: 2.5000 10*6.[IU] | INTRAMUSCULAR | Status: DC

## 2013-07-25 MED ORDER — TERBUTALINE SULFATE 1 MG/ML IJ SOLN: 0.2500 mg | Freq: Once | INTRAMUSCULAR | Status: AC | PRN

## 2013-07-25 MED ORDER — PENICILLIN G POTASSIUM 5000000 UNITS IJ SOLR
5.0000 10*6.[IU] | Freq: Once | INTRAMUSCULAR | Status: DC
Start: 2013-07-25 — End: 2013-07-25

## 2013-07-25 MED ORDER — PENICILLIN G POTASSIUM 5000000 UNITS IJ SOLR: 5.0000 10*6.[IU] | Freq: Once | INTRAMUSCULAR | Status: DC

## 2013-07-25 MED ORDER — PHENYLEPHRINE 40 MCG/ML (10ML) SYRINGE FOR IV PUSH (FOR BLOOD PRESSURE SUPPORT)
80.0000 ug | PREFILLED_SYRINGE | INTRAVENOUS | Status: DC | PRN
  Filled 2013-07-25 (×2): qty 5
  Filled 2013-07-25: qty 2

## 2013-07-25 MED ORDER — FENTANYL 2.5 MCG/ML BUPIVACAINE 1/10 % EPIDURAL INFUSION (WH - ANES)
14.0000 mL/h | INTRAMUSCULAR | Status: DC | PRN
  Administered 2013-07-25 – 2013-07-26 (×3): 14 mL/h via EPIDURAL
  Filled 2013-07-25 (×3): qty 125

## 2013-07-25 MED ORDER — DIPHENHYDRAMINE HCL 50 MG/ML IJ SOLN: 12.5000 mg | INTRAMUSCULAR | Status: DC | PRN

## 2013-07-25 MED ORDER — FENTANYL CITRATE 0.05 MG/ML IJ SOLN
INTRAMUSCULAR | Status: DC | PRN
  Administered 2013-07-25 – 2013-07-26 (×2): 100 ug via EPIDURAL

## 2013-07-25 MED ORDER — FENTANYL CITRATE 0.05 MG/ML IJ SOLN
INTRAMUSCULAR | Status: AC
  Filled 2013-07-25: qty 2

## 2013-07-25 MED ORDER — PENICILLIN G POTASSIUM 5000000 UNITS IJ SOLR
2.5000 10*6.[IU] | INTRAVENOUS | Status: DC
  Administered 2013-07-25 – 2013-07-26 (×5): 2.5 10*6.[IU] via INTRAVENOUS
  Filled 2013-07-25 (×8): qty 2.5

## 2013-07-25 MED ORDER — PENICILLIN G POTASSIUM 5000000 UNITS IJ SOLR
5.0000 10*6.[IU] | Freq: Once | INTRAVENOUS | Status: AC
  Administered 2013-07-25: 5 10*6.[IU] via INTRAVENOUS
  Filled 2013-07-25: qty 5

## 2013-07-25 MED ORDER — OXYTOCIN 40 UNITS IN LACTATED RINGERS INFUSION - SIMPLE MED
INTRAVENOUS | Status: AC
  Filled 2013-07-25: qty 1000

## 2013-07-25 MED ORDER — PHENYLEPHRINE 40 MCG/ML (10ML) SYRINGE FOR IV PUSH (FOR BLOOD PRESSURE SUPPORT)
80.0000 ug | PREFILLED_SYRINGE | INTRAVENOUS | Status: DC | PRN
  Filled 2013-07-25: qty 2

## 2013-07-25 MED ORDER — EPHEDRINE 5 MG/ML INJ
10.0000 mg | INTRAVENOUS | Status: DC | PRN
Start: 2013-07-25 — End: 2013-07-26
  Filled 2013-07-25: qty 4
  Filled 2013-07-25: qty 2

## 2013-07-25 MED ORDER — BUPIVACAINE HCL (PF) 0.25 % IJ SOLN
INTRAMUSCULAR | Status: DC | PRN
  Administered 2013-07-25 – 2013-07-26 (×2): 8 mL

## 2013-07-25 MED ORDER — LIDOCAINE HCL (PF) 1 % IJ SOLN
INTRAMUSCULAR | Status: DC | PRN
  Administered 2013-07-25: 5 mL

## 2013-07-25 MED ORDER — EPHEDRINE 5 MG/ML INJ
10.0000 mg | INTRAVENOUS | Status: DC | PRN
  Filled 2013-07-25: qty 2

## 2013-07-25 MED ORDER — LACTATED RINGERS IV SOLN
500.0000 mL | Freq: Once | INTRAVENOUS | Status: AC
  Administered 2013-07-25: 1000 mL via INTRAVENOUS

## 2013-07-25 MED ORDER — OXYTOCIN 40 UNITS IN LACTATED RINGERS INFUSION - SIMPLE MED
1.0000 m[IU]/min | INTRAVENOUS | Status: DC
  Administered 2013-07-25: 2 m[IU]/min via INTRAVENOUS
  Administered 2013-07-26: 14 m[IU]/min via INTRAVENOUS
  Filled 2013-07-25: qty 1000

## 2013-07-25 NOTE — Progress Notes (Signed)
Miranda [name] is a 32 y.o. G1P0 at [redacted]w[redacted]d admitted for induction of labor due to Class B DM.  Subjective: Comfortable w/ epidural, no complaints  Objective: BP 139/83  Pulse 88  Temp(Src) 98.3 F (36.8 C) (Oral)  Resp 20  Ht 5\' 8"  (1.727 m)  Wt 100.699 kg (222 lb)  BMI 33.76 kg/m2  SpO2 98%  LMP 09/28/2012 I/O last 3 completed shifts: In: -  Out: 150 [Urine:150]   IFSE recently placed by RN d/t inability to trace FHR continuously  FHT:  FHR: 145 bpm, variability: min-mod,  accelerations:  Abscent,  decelerations:  Present variables, lates-which have now resolved UC:   regular, every 2 minutes SVE:  8.5/90/0 w/ caput  CBG (last 3)   Recent Labs  07/25/13 1214 07/25/13 1716 07/25/13 2110  GLUCAP 75 74 69*     Labs: Lab Results  Component Value Date   WBC 13.8* 07/24/2013   HGB 11.2* 07/24/2013   HCT 32.8* 07/24/2013   MCV 83.2 07/24/2013   PLT 277 07/24/2013    Assessment / Plan: IOL d/t ClassBDM, progressing on pitocin  Labor: progressing Preeclampsia:  n/a Fetal Wellbeing:  Category II Pain Control:  Epidural I/D:  pcn for gbs pos Anticipated MOD:  NSVD  Updated Dr. Hassan Rowan, Cheron Every 07/25/2013, 11:25 PM

## 2013-07-25 NOTE — Anesthesia Preprocedure Evaluation (Addendum)
Anesthesia Evaluation  Patient identified by MRN, date of birth, ID band Patient awake    Reviewed: Allergy & Precautions, H&P , Patient's Chart, lab work & pertinent test results  Airway Mallampati: III TM Distance: >3 FB Neck ROM: full    Dental no notable dental hx.    Pulmonary neg pulmonary ROS,  breath sounds clear to auscultation  Pulmonary exam normal       Cardiovascular negative cardio ROS  + dysrhythmias Rhythm:regular Rate:Normal     Neuro/Psych negative neurological ROS  negative psych ROS   GI/Hepatic negative GI ROS, Neg liver ROS,   Endo/Other  negative endocrine ROSdiabetesHypothyroidism   Renal/GU negative Renal ROS     Musculoskeletal   Abdominal   Peds  Hematology negative hematology ROS (+)   Anesthesia Other Findings      Heart valve problem     Palpitation        Chest pain 07/24/2012 2D Echo EF 60%-65% which shoewd a bicuspid aortic vavle with mild stenosis PVC (premature ventricular contraction) 07/24/2012 stress test which was normal with good excercise    Diabetes mellitus   type 2 on insulin Herpes    Reproductive/Obstetrics (+) Pregnancy                           Anesthesia Physical Anesthesia Plan  ASA: III  Anesthesia Plan: Epidural   Post-op Pain Management:    Induction:   Airway Management Planned:   Additional Equipment:   Intra-op Plan:   Post-operative Plan:   Informed Consent: I have reviewed the patients History and Physical, chart, labs and discussed the procedure including the risks, benefits and alternatives for the proposed anesthesia with the patient or authorized representative who has indicated his/her understanding and acceptance.     Plan Discussed with:   Anesthesia Plan Comments:        will check bp for full hour post procedure and only test dose with 5ml of lido 1% then start infusion.  Bolus parameters set to 5ml  (not to be used without calling mda) and nurses instructed to avoid ephedrine. Anesthesia Quick Evaluation

## 2013-07-25 NOTE — Anesthesia Procedure Notes (Signed)
Epidural Patient location during procedure: OB Start time: 07/25/2013 1:14 PM  Staffing Anesthesiologist: Angus Seller., Harrell Gave. Performed by: anesthesiologist   Preanesthetic Checklist Completed: patient identified, site marked, surgical consent, pre-op evaluation, timeout performed, IV checked, risks and benefits discussed and monitors and equipment checked  Epidural Patient position: sitting Prep: site prepped and draped and DuraPrep Patient monitoring: continuous pulse ox and blood pressure Approach: midline Injection technique: LOR air  Needle:  Needle type: Tuohy  Needle gauge: 17 G Needle length: 9 cm and 9 Needle insertion depth: 6 cm Catheter type: closed end flexible Catheter size: 19 Gauge Catheter at skin depth: 10 cm Test dose: negative  Assessment Events: blood not aspirated, injection not painful, no injection resistance, negative IV test and no paresthesia  Additional Notes Patient identified.  Risk benefits discussed including failed block, incomplete pain control, headache, nerve damage, paralysis, blood pressure changes, nausea, vomiting, reactions to medication both toxic or allergic, and postpartum back pain.  Patient expressed understanding and wished to proceed.  All questions were answered.  Sterile technique used throughout procedure and epidural site dressed with sterile barrier dressing. No paresthesia or other complications noted.The patient did not experience any signs of intravascular injection such as tinnitus or metallic taste in mouth nor signs of intrathecal spread such as rapid motor block. Please see nursing notes for vital signs.

## 2013-07-25 NOTE — Progress Notes (Signed)
Called Anesthesia to report no relief from pca button.

## 2013-07-25 NOTE — Progress Notes (Signed)
Pt off monitors and to shower per providers orders.

## 2013-07-25 NOTE — H&P (Signed)
Attestation of Attending Supervision of Advanced Practitioner: Evaluation and management procedures were performed by the PA/NP/CNM/OB Fellow under my supervision/collaboration. Chart reviewed and agree with management and plan.  Ellington Cornia V 07/25/2013 12:06 AM

## 2013-07-25 NOTE — Progress Notes (Signed)
Patient complaining of lower abdominal cramping despite interventions for relief, including position changes, ensuring bladder is empty, and pt otherwise comfortable.  Called Dr. Sheral Apley, who stated to push PCA button for pt, but to check blood pressure twice afterward, five minutes apart.

## 2013-07-25 NOTE — Progress Notes (Signed)
This note also relates to the following rows which could not be included: Pulse Rate - Cannot attach notes to unvalidated device data SpO2 - Cannot attach notes to unvalidated device data   Provider notified of fhr, interventions, and cervical exam.  Provider will come to room to examine.

## 2013-07-25 NOTE — Progress Notes (Signed)
Miranda [name] is a 32 y.o. G1P0 at [redacted]w[redacted]d induction of labor due to ClassBDM.  Subjective: Mostly comfortable w/ epidural, cramping lower abdomen w/ uc's, just received epidural pca dose  Objective: BP 130/85  Pulse 75  Temp(Src) 98.2 F (36.8 C) (Oral)  Resp 20  Ht 5\' 8"  (1.727 m)  Wt 100.699 kg (222 lb)  BMI 33.76 kg/m2  SpO2 98%  LMP 09/28/2012 I/O last 3 completed shifts: In: -  Out: 150 [Urine:150]    CBG (last 3)   Recent Labs  07/24/13 2127 07/25/13 1214 07/25/13 1716  GLUCAP 87 75 74     FHT:  FHR: 130 bpm, variability: moderate,  accelerations:  Present,  decelerations:  Present occ mild variable UC:   regular, every 1.5-2 minutes SVE:   Dilation: 7 Effacement (%): 90 Station: -1 Exam by:: kbooker, cnm AROM scant amt clear fluid, IUPC placed w/o difficulty EFW by palpation ~8lb  Labs: Lab Results  Component Value Date   WBC 13.8* 07/24/2013   HGB 11.2* 07/24/2013   HCT 32.8* 07/24/2013   MCV 83.2 07/24/2013   PLT 277 07/24/2013    Assessment / Plan: IOL d/t ClassBDM, pitocin at 18mu/min, previously 8 by RN exam, 7/90/-1 at present, now arom'd w/ IUPC  Labor: progressing slowly Preeclampsia:  n/a Fetal Wellbeing:  Category II Pain Control:  Epidural I/D:  pcn for gbs pos Anticipated MOD:  NSVD  [name] 07/25/2013, 7:16 PM

## 2013-07-25 NOTE — Progress Notes (Signed)
Anesthesia called to report pt's pain level, provider states its ok to hit her PCEA button.  He said to push it twice more if needed than to call if it doesn't work.  Vs reported to provider.

## 2013-07-25 NOTE — Progress Notes (Signed)
Miranda [name] is a 32 y.o. G1P0 at [redacted]w[redacted]d admitted for IOL 2/2 classBDM  Subjective: Denies any acute discomfort, epidural in place. Still very anxious about labor. No other concerns. All questions at this time have been answer by excellent RN Erskine Squibb  Objective: BP 127/77  Pulse 70  Temp(Src) 98.2 F (36.8 C) (Oral)  Resp 18  Ht 5\' 8"  (1.727 m)  Wt 100.699 kg (222 lb)  BMI 33.76 kg/m2  SpO2 98%  LMP 09/28/2012   Total I/O In: -  Out: 150 [Urine:150]  FHT:  FHR: 135 bpm, variability: moderate,  accelerations:  present,  decelerations:  Absent UC:   regular, every 2-3 minutes SVE:   Dilation: 8 Effacement (%): 100 Station: -1 Exam by:: Enis Slipper, RN  Labs: Lab Results  Component Value Date   WBC 13.8* 07/24/2013   HGB 11.2* 07/24/2013   HCT 32.8* 07/24/2013   MCV 83.2 07/24/2013   PLT 277 07/24/2013    Assessment / Plan: IOL d/t ClassBDM, pitocin currently on 24mu, will continue to increase as tolerated  Labor: Progressing on Pitocin, will continue to increase then AROM Preeclampsia:  n/a Fetal Wellbeing:  Category I Pain Control:  Epidural I/D:  PCN for GBS post Anticipated MOD:  NSVD  Anselm Lis 07/25/2013, 5:31 PM

## 2013-07-25 NOTE — Progress Notes (Signed)
Miranda [name] is a 32 y.o. G1P0 at [redacted]w[redacted]d admitted for induction of labor due to ClassBDM.  Subjective: Mostly comfortable w/ epidural. Still feels uc's, but much better than before.  RN states anesthesiologist gave smaller loading and continuous dose d/t h/o bicuspid aortic valve w/ mild stenosis.   Objective: BP 138/95  Pulse 79  Temp(Src) 97.7 F (36.5 C) (Oral)  Resp 18  Ht 5\' 8"  (1.727 m)  Wt 100.699 kg (222 lb)  BMI 33.76 kg/m2  LMP 09/28/2012      FHT:  FHR: 130 bpm, variability: moderate,  accelerations:  Present,  decelerations:  Absent UC:   regular, every 2-3 minutes, currently not tracing well, RN just readjusted toco SVE:   Dilation: 6 Effacement (%): 80 Station: -2 Exam by:: Miranda [name], CNM  CBG (last 3)   Recent Labs  07/24/13 2127 07/25/13 1214  GLUCAP 87 75     Labs: Lab Results  Component Value Date   WBC 13.8* 07/24/2013   HGB 11.2* 07/24/2013   HCT 32.8* 07/24/2013   MCV 83.2 07/24/2013   PLT 277 07/24/2013    Assessment / Plan: IOL d/t ClassBDM, pitocin currently at 41mu/min, RN to continue increasing to achieve adequate labor/dilation.   Labor: early Preeclampsia:  n/a Fetal Wellbeing:  Category I Pain Control:  Epidural I/D:  pcn for gbs pos Anticipated MOD:  NSVD  Miranda [name] 07/25/2013, 1:55 PM

## 2013-07-26 ENCOUNTER — Encounter (HOSPITAL_COMMUNITY): Payer: Self-pay

## 2013-07-26 DIAGNOSIS — E079 Disorder of thyroid, unspecified: Secondary | ICD-10-CM

## 2013-07-26 DIAGNOSIS — O99284 Endocrine, nutritional and metabolic diseases complicating childbirth: Secondary | ICD-10-CM

## 2013-07-26 DIAGNOSIS — O99814 Abnormal glucose complicating childbirth: Secondary | ICD-10-CM

## 2013-07-26 LAB — GLUCOSE, CAPILLARY
Glucose-Capillary: 88 mg/dL (ref 70–99)
Glucose-Capillary: 164 mg/dL — ABNORMAL HIGH (ref 70–99)

## 2013-07-26 MED ORDER — DIPHENHYDRAMINE HCL 25 MG PO CAPS: 25.0000 mg | ORAL_CAPSULE | Freq: Four times a day (QID) | ORAL | Status: DC | PRN

## 2013-07-26 MED ORDER — ZOLPIDEM TARTRATE 5 MG PO TABS: 5.0000 mg | ORAL_TABLET | Freq: Every evening | ORAL | Status: DC | PRN

## 2013-07-26 MED ORDER — FENTANYL CITRATE 0.05 MG/ML IJ SOLN
INTRAMUSCULAR | Status: AC
  Filled 2013-07-26: qty 2

## 2013-07-26 MED ORDER — OXYCODONE-ACETAMINOPHEN 5-325 MG PO TABS
1.0000 | ORAL_TABLET | ORAL | Status: DC | PRN
  Administered 2013-07-26 – 2013-07-27 (×3): 1 via ORAL
  Filled 2013-07-26 (×3): qty 1

## 2013-07-26 MED ORDER — WITCH HAZEL-GLYCERIN EX PADS: 1.0000 "application " | MEDICATED_PAD | CUTANEOUS | Status: DC | PRN

## 2013-07-26 MED ORDER — LANOLIN HYDROUS EX OINT: TOPICAL_OINTMENT | CUTANEOUS | Status: DC | PRN

## 2013-07-26 MED ORDER — PRENATAL MULTIVITAMIN CH
1.0000 | ORAL_TABLET | Freq: Every day | ORAL | Status: DC
Start: 2013-07-26 — End: 2013-07-27
  Administered 2013-07-26 – 2013-07-27 (×2): 1 via ORAL
  Filled 2013-07-26 (×2): qty 1

## 2013-07-26 MED ORDER — LACTATED RINGERS IV SOLN
INTRAVENOUS | Status: DC
  Administered 2013-07-26: 300 mL/h via INTRAUTERINE

## 2013-07-26 MED ORDER — BENZOCAINE-MENTHOL 20-0.5 % EX AERO
1.0000 "application " | INHALATION_SPRAY | CUTANEOUS | Status: DC | PRN
  Administered 2013-07-27: 1 via TOPICAL
  Filled 2013-07-26: qty 56

## 2013-07-26 MED ORDER — SIMETHICONE 80 MG PO CHEW: 80.0000 mg | CHEWABLE_TABLET | ORAL | Status: DC | PRN

## 2013-07-26 MED ORDER — DIBUCAINE 1 % RE OINT: 1.0000 "application " | TOPICAL_OINTMENT | RECTAL | Status: DC | PRN

## 2013-07-26 MED ORDER — SENNOSIDES-DOCUSATE SODIUM 8.6-50 MG PO TABS
2.0000 | ORAL_TABLET | ORAL | Status: DC
  Administered 2013-07-27: 2 via ORAL
  Filled 2013-07-26: qty 2

## 2013-07-26 MED ORDER — TETANUS-DIPHTH-ACELL PERTUSSIS 5-2.5-18.5 LF-MCG/0.5 IM SUSP: 0.5000 mL | Freq: Once | INTRAMUSCULAR | Status: DC

## 2013-07-26 MED ORDER — ONDANSETRON HCL 4 MG PO TABS: 4.0000 mg | ORAL_TABLET | ORAL | Status: DC | PRN

## 2013-07-26 MED ORDER — ONDANSETRON HCL 4 MG/2ML IJ SOLN: 4.0000 mg | INTRAMUSCULAR | Status: DC | PRN

## 2013-07-26 MED ORDER — PNEUMOCOCCAL VAC POLYVALENT 25 MCG/0.5ML IJ INJ
0.5000 mL | INJECTION | INTRAMUSCULAR | Status: AC
  Administered 2013-07-27: 0.5 mL via INTRAMUSCULAR
  Filled 2013-07-26: qty 0.5

## 2013-07-26 MED ORDER — IBUPROFEN 600 MG PO TABS
600.0000 mg | ORAL_TABLET | Freq: Four times a day (QID) | ORAL | Status: DC
  Administered 2013-07-26 – 2013-07-27 (×4): 600 mg via ORAL
  Filled 2013-07-26 (×4): qty 1

## 2013-07-26 NOTE — Anesthesia Postprocedure Evaluation (Signed)
  Anesthesia Post-op Note  Patient: Miranda [name]  Procedure(s) Performed: * No procedures listed *  Patient Location: Mother/Baby  Anesthesia Type:Epidural  Level of Consciousness: awake  Airway and Oxygen Therapy: Patient Spontanous Breathing  Post-op Pain: mild  Post-op Assessment: Patient's Cardiovascular Status Stable and Respiratory Function Stable  Post-op Vital Signs: stable  Complications: No apparent anesthesia complications

## 2013-07-26 NOTE — Progress Notes (Signed)
Miranda [name] is a 32 y.o. G1P0 at [redacted]w[redacted]d admitted for induction of labor due to ClassBDM.  Subjective: Fatigued, having Rt lower pressure. Denies ha, scotomata, ruq/epigastric pain, n/v.    Objective: BP 132/91  Pulse 108  Temp(Src) 98.4 F (36.9 C) (Oral)  Resp 16  Ht 5\' 8"  (1.727 m)  Wt 100.699 kg (222 lb)  BMI 33.76 kg/m2  SpO2 100%  LMP 09/28/2012 I/O last 3 completed shifts: In: -  Out: 150 [Urine:150] Total I/O In: -  Out: 1500 [Urine:1500] Few more systolic bp's in 16X DTRs 2+, 0-9+UEA edema, no clonus  FHT:  FHR: 145 bpm, variability: min-mod,  accelerations:  Present,  decelerations:  Present occ mild variable. Prolonged variable w/ SVE UC:   regular, every 1-3 minutes SVE:  Fat ant lip, now easily reduced, and stayed 10/100/1 w/ molding & caput  Labs: Lab Results  Component Value Date   WBC 13.8* 07/24/2013   HGB 11.2* 07/24/2013   HCT 32.8* 07/24/2013   MCV 83.2 07/24/2013   PLT 277 07/24/2013    Assessment / Plan: IOL d/t ClassBDM, slow progression, now 10/100/1 w/ Rt lower pressure, will see how she does w/ pushing  Labor: slow progression, beginning 2nd stage Preeclampsia:  Few more systolic bps in 90s, asymptomatic w/ normal exam, will check pre-e labs Fetal Wellbeing:  Category II Pain Control:  Epidural I/D:  pcn for gbs pos Anticipated MOD:  NSVD  [name] 07/26/2013, 5:22 AM

## 2013-07-26 NOTE — Progress Notes (Signed)
Miranda [name] is a 32 y.o. G1P0 at [redacted]w[redacted]d admitted for induction of labor due to ClassBDM. Called by RN, states pt is exhausted and wants to rest/labor down, had been pushing well and moved vtx from +1 to +2  Subjective: Exhausted, wants to take break from pushing and rest  Objective: BP 126/76  Pulse 72  Temp(Src) 99.1 F (37.3 C) (Axillary)  Resp 16  Ht 5\' 8"  (1.727 m)  Wt 100.699 kg (222 lb)  BMI 33.76 kg/m2  SpO2 100%  LMP 09/28/2012 I/O last 3 completed shifts: In: -  Out: 150 [Urine:150] Total I/O In: -  Out: 1500 [Urine:1500]  FHT:  FHR: 150 bpm, variability: minimal ,  accelerations:  Present,  decelerations:  Present occ mild variable UC:   regular, every 2 minutes SVE:  10/100/+2 per RN  Labs: Lab Results  Component Value Date   WBC 13.8* 07/24/2013   HGB 11.2* 07/24/2013   HCT 32.8* 07/24/2013   MCV 83.2 07/24/2013   PLT 277 07/24/2013    Assessment / Plan: IOL d/t ClassBDM, pushing x ~1.5hrs, has moved vtx down, but is exhausted and wants a break. Discussed w/ Dr. Shawnie Pons, OK to rest briefly then resuming pushing Temp 99.1, will continue to observe, and treat if needed  Labor: slow progression Preeclampsia:  labs pending, bp's better Fetal Wellbeing:  Category II Pain Control:  Epidural I/D:  pcn for gbs pos Anticipated MOD:  NSVD  [name] 07/26/2013, 6:39 AM

## 2013-07-26 NOTE — Lactation Note (Signed)
This note was copied from the chart of Miranda [name]. Lactation Consultation Note  Patient Name: Miranda [name] [id number] Date: 07/26/2013 Reason for consult: Initial assessment BF basics reviewed reviewed with Mom. Encouraged to BF with feeding ques, Keep baby STS when awake. Cluster feeding reviewed. Lactation brochure left for review. Advised of OP services and support group. Advised to call for assist as needed.   Maternal Data Formula Feeding for Exclusion: No Infant to breast within first hour of birth: Yes Has patient been taught Hand Expression?: Yes Does the patient have breastfeeding experience prior to this delivery?: No  Feeding Feeding Type: Breast Fed Length of feed: 10 min  LATCH Score/Interventions                      Lactation Tools Discussed/Used WIC Program: Yes   Consult Status Consult Status: Follow-up Date: 07/27/13 Follow-up type: In-patient    Alfred Levins 07/26/2013, 9:50 PM

## 2013-07-26 NOTE — Progress Notes (Signed)
Miranda [name] is a 32 y.o. G1P0 at [redacted]w[redacted]d admitted for induction of labor due to ClassBDM. RN previously called to state she had d/c'd pit d/t decels, gave order for amnioinfusion, and restart pitocin after reassuring fhr  Subjective: Comfortable w/ epidural, not feeling any pressure. Denies ha, scotomata, ruq/epigastric pain, n/v.    Objective: BP 125/95  Pulse 97  Temp(Src) 98.4 F (36.9 C) (Oral)  Resp 20  Ht 5\' 8"  (1.727 m)  Wt 100.699 kg (222 lb)  BMI 33.76 kg/m2  SpO2 100%  LMP 09/28/2012 I/O last 3 completed shifts: In: -  Out: 150 [Urine:150]   Occ 130-140/90 bp that correlates w/ pain. Most bp's 110-130s/60-80s  FHT:  FHR: 135 bpm, variability: moderate,  accelerations:  Abscent,  decelerations:  Present occ mild variables UC:   regular, every 2-3 minutes, MVUs 150-180 SVE:  Ant lip/90/0 w/ molding & caput, unable to reduce ant lip Pos scalp stim  Labs: Lab Results  Component Value Date   WBC 13.8* 07/24/2013   HGB 11.2* 07/24/2013   HCT 32.8* 07/24/2013   MCV 83.2 07/24/2013   PLT 277 07/24/2013    Assessment / Plan: IOL d/t ClassBDM, pitocin previously d/c'd d/t decels, amnioinfusion started, pit restarted and up to 64mu/min, mvu's inadequate- RN to continue increasing pitocin as needed to achieve adequate MVUs.  as fetus tolerates Pt turned to Rt exaggerated sims to try to get rid of ant lip that is R>L  Labor: protracted active phase Preeclampsia:  n/a Fetal Wellbeing:  Category II Pain Control:  Epidural I/D:  pcn for gbs pos Anticipated MOD:  NSVD  [name] 07/26/2013, 2:22 AM

## 2013-07-26 NOTE — Progress Notes (Signed)
Provider notified of pt's progress, pt's wish to take a break and labor down for awhile, fhr, station.

## 2013-07-27 LAB — GLUCOSE, CAPILLARY
Glucose-Capillary: 159 mg/dL — ABNORMAL HIGH (ref 70–99)
Glucose-Capillary: 73 mg/dL (ref 70–99)

## 2013-07-27 MED ORDER — IBUPROFEN 600 MG PO TABS: 600.0000 mg | ORAL_TABLET | Freq: Four times a day (QID) | ORAL | Status: DC

## 2013-07-27 NOTE — Discharge Summary (Signed)
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 No cords or calf tenderness.  Discharge Diagnoses: Term Pregnancy-delivered  Discharge Information: Date: 07/27/2013 Activity: pelvic rest Diet: routine Medications: PNV and Ibuprofen Condition: stable Instructions: refer to practice specific booklet Discharge to: home Follow-up Information   Follow up with [facility]. (f/u in 1 week for a repeat sugar check)    Specialty:  Obstetrics and Gynecology   Contact information:   [address] [place] [address] [phone]      Follow up with [facility] In 6 weeks. (for postpartum visit and 2 hour glucola)    Specialty:  Obstetrics and Gynecology   Contact information:   [address] [place] [address] [phone]      Newborn Data: Live born female  Birth Weight: 7 lb 2.3 oz (3240 g) APGAR: 8, 9  Home with mother.  Pt presented for IOL for class B DM @ 39 weeks.  She progressed well to complete and pushed for 3 hours. She delivered a liveborn female infant via vacuum assisted vaginal delivery. She tolerated this well and had no complications. Post partum care was uncomplicated. She is breast feeding and desires nexplanon for contraception.   Post partum fasting blood sugars in the 90s on metformin alone. Several post pradial around 150-160.  Will  have her continue checking sugars and [facility] to see what sugars are doing.   Miranda [name] 07/27/2013, 11:49 AM

## 2013-08-06 ENCOUNTER — Ambulatory Visit (HOSPITAL_COMMUNITY)
Admission: RE | Admit: 2013-08-06 | Discharge: 2013-08-06 | Disposition: A | Discharge status: Home / Self Care | Payer: BC Managed Care – PPO | Source: Ambulatory Visit | Attending: Internal Medicine | Admitting: Internal Medicine

## 2013-08-06 DIAGNOSIS — I359 Nonrheumatic aortic valve disorder, unspecified: Secondary | ICD-10-CM

## 2013-08-06 DIAGNOSIS — Q231 Congenital insufficiency of aortic valve: Secondary | ICD-10-CM | POA: Insufficient documentation

## 2013-08-06 NOTE — Progress Notes (Signed)
2D Echo Performed 08/06/2013    Miranda [name], RCS  

## 2013-08-07 LAB — OB RESULTS CONSOLE HGB/HCT, BLOOD
HCT: 37 %
Hemoglobin: 12.3 g/dL

## 2013-08-07 LAB — TSH: TSH: 0.82 u[IU]/mL (ref <=5.90)

## 2013-08-07 LAB — LIPID PANEL
Cholesterol: 164 mg/dL (ref 0–200)
HDL: 43 mg/dL (ref 35–70)
LDL Cholesterol: 101 mg/dL
LDL/HDL RATIO: 3.8
Triglycerides: 98 mg/dL (ref 40–160)

## 2013-08-07 LAB — OB RESULTS CONSOLE PLATELET COUNT: PLATELETS: 360 10*3/uL

## 2013-08-19 ENCOUNTER — Ambulatory Visit (INDEPENDENT_AMBULATORY_CARE_PROVIDER_SITE_OTHER): Payer: BC Managed Care – PPO | Admitting: Internal Medicine

## 2013-08-19 ENCOUNTER — Encounter: Payer: Self-pay | Admitting: Internal Medicine

## 2013-08-19 VITALS — BP 118/88 | HR 63 | Ht 68.0 in | Wt 196.8 lb

## 2013-08-19 DIAGNOSIS — R002 Palpitations: Secondary | ICD-10-CM

## 2013-08-19 NOTE — Progress Notes (Signed)
OFFICE NOTE  Chief Complaint:  Routine followup  Primary Care Physician: No PCP Per Patient  HPI:  Miranda [name] is a 32 year old female who is recently divorced (prior last name was Sela Hua), and has had episodes of presyncope and palpitations as well as orthostatic blood pressure changes and heart racing. She had PVCs and underwent a stress test which was normal with good exercise tolerance and had an echo which unfortunately showed a bicuspid aortic valve with mild stenosis. She continues to have palpitation episodes as well as some shortness of breath and pressure in her chest. It is not clear what these symptoms are related to; however, they could be related to arrhythmia or PVCs. She returns for routine followup today and it is notable that she is now pregnant. She was previously prescribed a beta blocker but did not start taking that due to her pregnancy.  She continues to have palpitations although not as frequent and is not particularly bothered by them.  She recently underwent a repeat echocardiogram and I'm pleased to say that the aortic valve is well visualized and there are clearly 3 cusps.  There was no gradient cross the aortic valve. She has since successfully delivered her daughter who is now 45 weeks old. She denies any palpitations, chest pain or associated symptoms since delivery.  PMHx:  Past Medical History  Diagnosis Date  . Thyroid disease   . Pregnant   . Heart valve problem   . Palpitation   . Chest pain 07/24/2012    2D Echo EF 60%-65% which shoewd a bicuspid aortic vavle with mild stenosis  . PVC (premature ventricular contraction) 07/24/2012    stress test which was normal with good excercise  . Diabetes mellitus     type 2 on insulin  . Herpes     Past Surgical History  Procedure Laterality Date  . No past surgeries      FAMHx:  Family History  Problem Relation Age of Onset  . Hypertension Mother   . Diabetes Brother   . Diabetes Maternal  Grandmother   . Congestive Heart Failure Maternal Grandmother     SOCHx:   reports that she quit smoking about 15 months ago. Her smoking use included Cigarettes. She has a 2.5 pack-year smoking history. She has never used smokeless tobacco. She reports that she does not drink alcohol or use illicit drugs.  ALLERGIES:  Allergies  Allergen Reactions  . Keflex [Cephalexin] Nausea And Vomiting    ROS: A comprehensive review of systems was negative except for: Cardiovascular: positive for palpitations pregnancy  HOME MEDS: Current Outpatient Prescriptions  Medication Sig Dispense Refill  . glucose blood test strip Use as instructed  100 each  12  . ibuprofen (ADVIL,MOTRIN) 600 MG tablet Take 1 tablet (600 mg total) by mouth every 6 (six) hours.  30 tablet  0  . levothyroxine (SYNTHROID) 100 MCG tablet Take 1 tablet (100 mcg total) by mouth daily before breakfast.  30 tablet  5  . metFORMIN (GLUCOPHAGE) 500 MG tablet Take 500 mg by mouth daily with breakfast.      . Prenatal Vit-Fe Fumarate-FA (MULTIVITAMIN-PRENATAL) 27-0.8 MG TABS Take 1 tablet by mouth daily at 12 noon.       No current facility-administered medications for this visit.    LABS/IMAGING: No results found for this or any previous visit (from the past 48 hour(s)). No results found.  VITALS: BP 118/88  Pulse 63  Ht 5\' 8"  (1.727 m)  Wt  196 lb 12.8 oz (89.268 kg)  BMI 29.93 kg/m2  EXAM: General appearance: alert and no distress Neck: no adenopathy, no carotid bruit, no JVD, supple, symmetrical, trachea midline and thyroid not enlarged, symmetric, no tenderness/mass/nodules Lungs: clear to auscultation bilaterally Heart: regular rate and rhythm, S1, S2 normal, no murmur, click, rub or gallop Abdomen: soft, non-tender, post-partum Extremities: extremities normal, atraumatic, no cyanosis or edema Pulses: 2+ and symmetric Skin: Skin color, texture, turgor normal. No rashes or lesions Neurologic: Grossly  normal  EKG: Normal sinus rhythm at 73  ASSESSMENT: 1. Normal aortic valve 2. Palpitations - resolved  PLAN: 1.   Ms. Stallsmith, indeed has a tricuspid or normal aortic valve with normal valve gradients.  The valve was well visualized on the most recent echocardiogram and this effectively excludes a bicuspid etiology.  Her palpitations have improved and she is not taking medication for this. Overall she's doing well and I do not believe she needs ongoing cardiac followup as her valve appears structurally normal. Followup with me as needed.  Chrystie Nose, MD, [facility] Attending Cardiologist [facility]  HILTY,Kenneth C 08/19/2013, 8:26 AM

## 2013-08-19 NOTE — Patient Instructions (Signed)
Your physician recommends that you schedule a follow-up appointment as needed  

## 2013-08-25 ENCOUNTER — Ambulatory Visit (INDEPENDENT_AMBULATORY_CARE_PROVIDER_SITE_OTHER): Payer: BC Managed Care – PPO | Admitting: Women's Health

## 2013-08-25 ENCOUNTER — Encounter: Payer: Self-pay | Admitting: Women's Health

## 2013-08-25 VITALS — BP 136/94 | Ht 68.0 in | Wt 197.6 lb

## 2013-08-25 DIAGNOSIS — Z3009 Encounter for other general counseling and advice on contraception: Secondary | ICD-10-CM

## 2013-08-25 DIAGNOSIS — O0990 Supervision of high risk pregnancy, unspecified, unspecified trimester: Secondary | ICD-10-CM

## 2013-08-25 NOTE — Progress Notes (Signed)
Patient ID: Miranda [name], female   DOB: [birth year], 32 y.o.   MRN: [id number] Subjective:    Miranda [name] is a 32 y.o. G16P1001 Caucasian female who presents for a postpartum visit. She is 4 weeks postpartum following a VAVD for prolonged 2nd stage. I have fully reviewed the prenatal and intrapartum course. 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 Outcome: vacuum, low. Anesthesia: epidural. Postpartum course has been uncomplicated. Baby's course has been uncomplicated. Baby is feeding by breast/bottle. Bleeding no bleeding and bleed x 3 weeks. Bowel function is normal. Bladder function is normal. Patient is sexually active. Contraception method is none. Had unprotected sex last night. Desires nexplanon. Discussed r/b, need for abstinence x at least 10d prior to insertion w/ bhcg am to ensure not pregnant. Postpartum depression screening: equivocal, score 11. Reports crying easily, but able to eat/appetite normal, able to sleep, still finds joy in things she used to and in spending time w/ infant, denies SI/HI/II, just feels like she's mean to FOB a lot. Discussed normal pp blues/hormonal changes, and relaxation exercises. To notify us if she begins to have further symptoms. Saw PCP at [facility] last week, to check thyroid. States she didn't mention anything to her about her DM, but she has another appt next month to f/u w/ thyroid labs. She was previously taking 500mg  metformin daily prior to pregnancy, pt states she resumed this regimen after she gave birth, but hasn't been checking cbg's.  Encouraged pt to check FBS and take log w/ her to PCP f/u next month to determine if current medication regimen adequate.   She also has h/o what was thought to be a bicuspid aortic valve w/ mild stenosis and normal EF, however she had a f/u appt w/ cardio on 08/19/13, and underwent a repeat echo at which time 3 cusps were clearly identified, she reported  resolution of palpitations, and she was deemed not to need any further f/u w/ cardio  H/O hypothyroidism on synthroid  The following portions of the patient's history were reviewed and updated as appropriate: allergies, current medications, past medical history, past surgical history and problem list.  Review of Systems Pertinent items are noted in HPI.   Filed Vitals:   08/25/13 1052  BP: 136/94  Height: 5\' 8"  (1.727 m)  Weight: 197 lb 9.6 oz (89.631 kg)   BP recheck: 120/84  Objective:     General:  alert, cooperative and no distress   Breasts:  deferred, no complaints  Lungs: clear to auscultation bilaterally  Heart:  regular rate and rhythm  Abdomen: soft, nontender   Vulva: normal  Vagina: normal vagina  Cervix:  closed  Corpus: Well-involuted  Adnexa:  Non-palpable  Rectal Exam: No hemorrhoids        Assessment:   Postpartum exam 4 wks s/p VAVD after IOL for ClassBDM Depression screening Contraception counseling   Plan:  F/U 09/07/13 for bhcg am and nexplanon insertion pm To remain abstinent until nexplanon placed Continue metformin 500mg  daily, to begin checking FBS, and take log to PCP appt next month **Check to see when last pap was**  Cheral Marker, CNM, [facility] 08/25/2013 1:44 PM

## 2013-08-25 NOTE — Patient Instructions (Signed)
No sex until after nexplanon placed on 09/07/13!  Etonogestrel implant- Nexplanon What is this medicine? ETONOGESTREL (et oh noe JES trel) is a contraceptive (birth control) device. It is used to prevent pregnancy. It can be used for up to 3 years. This medicine may be used for other purposes; ask your health care provider or pharmacist if you have questions. COMMON BRAND NAME(S): Implanon, Nexplanon  What should I tell my health care provider before I take this medicine? They need to know if you have any of these conditions: -abnormal vaginal bleeding -blood vessel disease or blood clots -cancer of the breast, cervix, or liver -depression -diabetes -gallbladder disease -headaches -heart disease or recent heart attack -high blood pressure -high cholesterol -kidney disease -liver disease -renal disease -seizures -tobacco smoker -an unusual or allergic reaction to etonogestrel, other hormones, anesthetics or antiseptics, medicines, foods, dyes, or preservatives -pregnant or trying to get pregnant -breast-feeding How should I use this medicine? This device is inserted just under the skin on the inner side of your upper arm by a health care professional. Talk to your pediatrician regarding the use of this medicine in children. Special care may be needed. Overdosage: If you think you've taken too much of this medicine contact a poison control center or emergency room at once. Overdosage: If you think you have taken too much of this medicine contact a poison control center or emergency room at once. NOTE: This medicine is only for you. Do not share this medicine with others. What if I miss a dose? This does not apply. What may interact with this medicine? Do not take this medicine with any of the following medications: -amprenavir -bosentan -fosamprenavir This medicine may also interact with the following medications: -barbiturate medicines for inducing sleep or treating  seizures -certain medicines for fungal infections like ketoconazole and itraconazole -griseofulvin -medicines to treat seizures like carbamazepine, felbamate, oxcarbazepine, phenytoin, topiramate -modafinil -phenylbutazone -rifampin -some medicines to treat HIV infection like atazanavir, indinavir, lopinavir, nelfinavir, tipranavir, ritonavir -St. John's wort This list may not describe all possible interactions. Give your health care provider a list of all the medicines, herbs, non-prescription drugs, or dietary supplements you use. Also tell them if you smoke, drink alcohol, or use illegal drugs. Some items may interact with your medicine. What should I watch for while using this medicine? This product does not protect you against HIV infection (AIDS) or other sexually transmitted diseases. You should be able to feel the implant by pressing your fingertips over the skin where it was inserted. Tell your doctor if you cannot feel the implant. What side effects may I notice from receiving this medicine? Side effects that you should report to your doctor or health care professional as soon as possible: -allergic reactions like skin rash, itching or hives, swelling of the face, lips, or tongue -breast lumps -changes in vision -confusion, trouble speaking or understanding -dark urine -depressed mood -general ill feeling or flu-like symptoms -light-colored stools -loss of appetite, nausea -right upper belly pain -severe headaches -severe pain, swelling, or tenderness in the abdomen -shortness of breath, chest pain, swelling in a leg -signs of pregnancy -sudden numbness or weakness of the face, arm or leg -trouble walking, dizziness, loss of balance or coordination -unusual vaginal bleeding, discharge -unusually weak or tired -yellowing of the eyes or skin Side effects that usually do not require medical attention (Report these to your doctor or health care professional if they continue or  are bothersome.): -acne -breast pain -changes in weight -  cough -fever or chills -headache -irregular menstrual bleeding -itching, burning, and vaginal discharge -pain or difficulty passing urine -sore throat This list may not describe all possible side effects. Call your doctor for medical advice about side effects. You may report side effects to FDA at 1-800-FDA-1088. Where should I keep my medicine? This drug is given in a hospital or clinic and will not be stored at home. NOTE: This sheet is a summary. It may not cover all possible information. If you have questions about this medicine, talk to your doctor, pharmacist, or health care provider.  2014, Elsevier/Gold Standard. (2012-03-31 15:37:45)

## 2013-08-27 ENCOUNTER — Encounter: Payer: BC Managed Care – PPO | Admitting: Advanced Practice Midwife

## 2013-09-01 ENCOUNTER — Ambulatory Visit: Payer: BC Managed Care – PPO | Admitting: Obstetrics & Gynecology

## 2013-09-07 ENCOUNTER — Other Ambulatory Visit: Payer: BC Managed Care – PPO

## 2013-09-07 ENCOUNTER — Encounter: Payer: Self-pay | Admitting: Women's Health

## 2013-09-07 ENCOUNTER — Ambulatory Visit (INDEPENDENT_AMBULATORY_CARE_PROVIDER_SITE_OTHER): Payer: BC Managed Care – PPO | Admitting: Women's Health

## 2013-09-07 VITALS — BP 110/80 | Ht 68.0 in | Wt 192.0 lb

## 2013-09-07 DIAGNOSIS — F329 Major depressive disorder, single episode, unspecified: Secondary | ICD-10-CM

## 2013-09-07 DIAGNOSIS — Z3202 Encounter for pregnancy test, result negative: Secondary | ICD-10-CM

## 2013-09-07 DIAGNOSIS — Z30017 Encounter for initial prescription of implantable subdermal contraceptive: Secondary | ICD-10-CM

## 2013-09-07 DIAGNOSIS — F32A Depression, unspecified: Secondary | ICD-10-CM

## 2013-09-07 MED ORDER — ESCITALOPRAM OXALATE 10 MG PO TABS: 10.0000 mg | ORAL_TABLET | Freq: Every day | ORAL | Status: DC

## 2013-09-07 NOTE — Progress Notes (Signed)
Miranda [name] is a 32 y.o. year old Caucasian female here for Nexplanon insertion.  She also reports that she feels like depression has worsened since her pp visit on 08/25/13.  At that time her EPDS was 11. She feels like she cries all the time, not able to eat or sleep well, finds joy in baby but not much else. Denies SI/HI/II. Discussed option of SSRI and referral to psychiatrist. Pt would like to do both.   Patient's last menstrual period was 09/02/2013., last sexual intercourse was 08/24/13, and her bhcg pregnancy test today was negative.  Risks/benefits/side effects of Nexplanon have been discussed and her questions have been answered.  Specifically, a failure rate of 10/998 has been reported, with an increased failure rate if pt takes St. John's Wort and/or antiseizure medicaitons.  Miranda [name] is aware of the common side effect of irregular bleeding, which the incidence of decreases over time. She is also aware that Nexplanon could possibly make depression worse. She wishes to proceed w/ insertion today.   Her left arm, approximatly 4 inches proximal from the elbow, was cleansed with alcohol and anesthetized with 2cc of 2% Lidocaine.  The area was cleansed again with betadine and the Nexplanon was inserted per manufacturer's recommendations without difficulty.  A steri-strip and pressure bandage were applied.  Pt was instructed to keep the area clean and dry, remove pressure bandage in 24 hours, and keep insertion site covered with the steri-strip for 3-5 days.  Back up contraception was recommended for 2 weeks.    Rx Lexapro 10mg  po daily Pt has [employer], referred to [facility], psychiatrist in [place]- gave info to pt who is to call and schedule appt F/U in 4wks for pap & physical, and to see how lexapro is working To call/go to ED for worsening depression/SI/HI/II   [name] 09/07/2013

## 2013-09-07 NOTE — Patient Instructions (Addendum)
Keep the area clean and dry.  You can remove the big bandage in 24 hours, and the small steri-strip bandage in 3-5 days.  A back up method, such as condoms, should be used for two weeks. You may have irregular vaginal bleeding for the first 6 months after the Nexplanon is placed, then the bleeding usually lightens and it is possible that you may not have any periods.  If you have any concerns, please give [place] a call.    Depression, Adult Depression refers to feeling sad, low, down in the dumps, blue, gloomy, or empty. In general, there are two kinds of depression: 1. Depression that we all experience from time to time because of upsetting life experiences, including the loss of a job or the ending of a relationship (normal sadness or normal grief). This kind of depression is considered normal, is short lived, and resolves within a few days to 2 weeks. (Depression experienced after the loss of a loved one is called bereavement. Bereavement often lasts longer than 2 weeks but normally gets better with time.) 2. Clinical depression, which lasts longer than normal sadness or normal grief or interferes with your ability to function at home, at work, and in school. It also interferes with your personal relationships. It affects almost every aspect of your life. Clinical depression is an illness. Symptoms of depression also can be caused by conditions other than normal sadness and grief or clinical depression. Examples of these conditions are listed as follows:  Physical illness Some physical illnesses, including underactive thyroid gland (hypothyroidism), severe anemia, specific types of cancer, diabetes, uncontrolled seizures, heart and lung problems, strokes, and chronic pain are commonly associated with symptoms of depression.  Side effects of some prescription medicine In some people, certain types of prescription medicine can cause symptoms of depression.  Substance abuse Abuse of alcohol and illicit drugs  can cause symptoms of depression. SYMPTOMS Symptoms of normal sadness and normal grief include the following:  Feeling sad or crying for short periods of time.  Not caring about anything (apathy).  Difficulty sleeping or sleeping too much.  No longer able to enjoy the things you used to enjoy.  Desire to be by oneself all the time (social isolation).  Lack of energy or motivation.  Difficulty concentrating or remembering.  Change in appetite or weight.  Restlessness or agitation. Symptoms of clinical depression include the same symptoms of normal sadness or normal grief and also the following symptoms:  Feeling sad or crying all the time.  Feelings of guilt or worthlessness.  Feelings of hopelessness or helplessness.  Thoughts of suicide or the desire to harm yourself (suicidal ideation).  Loss of touch with reality (psychotic symptoms). Seeing or hearing things that are not real (hallucinations) or having false beliefs about your life or the people around you (delusions and paranoia). DIAGNOSIS  The diagnosis of clinical depression usually is based on the severity and duration of the symptoms. Your caregiver also will ask you questions about your medical history and substance use to find out if physical illness, use of prescription medicine, or substance abuse is causing your depression. Your caregiver also may order blood tests. TREATMENT  Typically, normal sadness and normal grief do not require treatment. However, sometimes antidepressant medicine is prescribed for bereavement to ease the depressive symptoms until they resolve. The treatment for clinical depression depends on the severity of your symptoms but typically includes antidepressant medicine, counseling with a mental health professional, or a combination of both. Your  caregiver will help to determine what treatment is best for you. Depression caused by physical illness usually goes away with appropriate medical  treatment of the illness. If prescription medicine is causing depression, talk with your caregiver about stopping the medicine, decreasing the dose, or substituting another medicine. Depression caused by abuse of alcohol or illicit drugs abuse goes away with abstinence from these substances. Some adults need professional help in order to stop drinking or using drugs. SEEK IMMEDIATE CARE IF:  You have thoughts about hurting yourself or others.  You lose touch with reality (have psychotic symptoms).  You are taking medicine for depression and have a serious side effect. FOR MORE INFORMATION National Alliance on Mental Illness: www.nami.[employer]: http://www.maynard.net/ Document Released: 09/21/2000 Document Revised: 03/25/2012 Document Reviewed: 12/24/2011 [facility] Patient Information 2014 [place], [place].

## 2013-10-06 ENCOUNTER — Other Ambulatory Visit: Payer: BC Managed Care – PPO | Admitting: Women's Health

## 2013-10-13 ENCOUNTER — Other Ambulatory Visit: Payer: BC Managed Care – PPO | Admitting: Women's Health

## 2013-10-21 ENCOUNTER — Encounter: Payer: Self-pay | Admitting: Women's Health

## 2013-10-21 ENCOUNTER — Ambulatory Visit (INDEPENDENT_AMBULATORY_CARE_PROVIDER_SITE_OTHER): Payer: BC Managed Care – PPO | Admitting: Women's Health

## 2013-10-21 ENCOUNTER — Other Ambulatory Visit (HOSPITAL_COMMUNITY)
Admission: RE | Admit: 2013-10-21 | Discharge: 2013-10-21 | Disposition: A | Discharge status: Home / Self Care | Payer: BC Managed Care – PPO | Source: Ambulatory Visit | Attending: Obstetrics & Gynecology | Admitting: Obstetrics & Gynecology

## 2013-10-21 VITALS — BP 102/70 | Ht 68.0 in | Wt 193.5 lb

## 2013-10-21 DIAGNOSIS — Z1151 Encounter for screening for human papillomavirus (HPV): Secondary | ICD-10-CM | POA: Insufficient documentation

## 2013-10-21 DIAGNOSIS — F32A Depression, unspecified: Secondary | ICD-10-CM | POA: Insufficient documentation

## 2013-10-21 DIAGNOSIS — F17200 Nicotine dependence, unspecified, uncomplicated: Secondary | ICD-10-CM | POA: Insufficient documentation

## 2013-10-21 DIAGNOSIS — E119 Type 2 diabetes mellitus without complications: Secondary | ICD-10-CM

## 2013-10-21 DIAGNOSIS — IMO0002 Reserved for concepts with insufficient information to code with codable children: Secondary | ICD-10-CM

## 2013-10-21 DIAGNOSIS — Z01419 Encounter for gynecological examination (general) (routine) without abnormal findings: Secondary | ICD-10-CM | POA: Insufficient documentation

## 2013-10-21 DIAGNOSIS — N938 Other specified abnormal uterine and vaginal bleeding: Secondary | ICD-10-CM

## 2013-10-21 DIAGNOSIS — B3731 Acute candidiasis of vulva and vagina: Secondary | ICD-10-CM

## 2013-10-21 DIAGNOSIS — E039 Hypothyroidism, unspecified: Secondary | ICD-10-CM

## 2013-10-21 DIAGNOSIS — F329 Major depressive disorder, single episode, unspecified: Secondary | ICD-10-CM | POA: Insufficient documentation

## 2013-10-21 DIAGNOSIS — B373 Candidiasis of vulva and vagina: Secondary | ICD-10-CM

## 2013-10-21 DIAGNOSIS — Z0000 Encounter for general adult medical examination without abnormal findings: Secondary | ICD-10-CM

## 2013-10-21 DIAGNOSIS — L0232 Furuncle of buttock: Secondary | ICD-10-CM

## 2013-10-21 MED ORDER — SULFAMETHOXAZOLE-TMP DS 800-160 MG PO TABS: 1.0000 | ORAL_TABLET | Freq: Two times a day (BID) | ORAL | Status: DC

## 2013-10-21 MED ORDER — MEGESTROL ACETATE 40 MG PO TABS: ORAL_TABLET | ORAL | Status: DC

## 2013-10-21 MED ORDER — FLUCONAZOLE 150 MG PO TABS: 150.0000 mg | ORAL_TABLET | Freq: Once | ORAL | Status: DC
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
## 2013-10-21 NOTE — Patient Instructions (Addendum)
[facility]         Address: [address], [place], [place] [address]         [phone]      24-Hour Crisis Line:  [phone]              Business Hours:  Monday - Friday from 8:30a.m. until 5:00p.m.  Eucerin cream to rash on legs, can use benadryl or hydrocortisone cream if it begins to itch

## 2013-10-22 ENCOUNTER — Other Ambulatory Visit: Payer: Self-pay | Admitting: Obstetrics and Gynecology

## 2013-10-23 NOTE — Telephone Encounter (Signed)
Synthroid refilled x 3 mos

## 2013-10-26 ENCOUNTER — Encounter: Payer: Self-pay | Admitting: *Deleted

## 2013-11-04 ENCOUNTER — Ambulatory Visit: Payer: BC Managed Care – PPO | Admitting: Women's Health

## 2013-11-11 ENCOUNTER — Encounter: Payer: Self-pay | Admitting: Women's Health

## 2013-11-11 ENCOUNTER — Ambulatory Visit (INDEPENDENT_AMBULATORY_CARE_PROVIDER_SITE_OTHER): Payer: BC Managed Care – PPO | Admitting: Women's Health

## 2013-11-11 VITALS — BP 130/80 | Ht 68.0 in | Wt 196.0 lb

## 2013-11-11 DIAGNOSIS — Z309 Encounter for contraceptive management, unspecified: Secondary | ICD-10-CM

## 2013-11-11 DIAGNOSIS — Z3202 Encounter for pregnancy test, result negative: Secondary | ICD-10-CM

## 2013-11-11 DIAGNOSIS — Z3046 Encounter for surveillance of implantable subdermal contraceptive: Secondary | ICD-10-CM

## 2013-11-11 MED ORDER — NORETHIN-ETH ESTRAD-FE BIPHAS 1 MG-10 MCG / 10 MCG PO TABS: 1.0000 | ORAL_TABLET | Freq: Every day | ORAL | Status: DC

## 2013-11-11 NOTE — Progress Notes (Signed)
Patient ID: Miranda AlamoMary S Holwerda, female   DOB: 1981-06-04, 33 y.o.   MRN: [id number] Miranda [name] is a 33 y.o. year old Caucasian female here for Nexplanon removal.  Patient given informed consent for removal of her Nexplanon. She had it placed 09/07/13. H/O depression, was counseled prior to insertion that it could possibly worsen depression, but pt wanted to try. She does feel like depression has worsened and would like it out. She would like to begin COCs. No h/o DVT/PE, CVA, or migraines w/ aura. She does smoke 1/2ppd. Counseled that smoking w/ COCs could increase r/f cardiovascular events and recommended cessation. She is still going to counseling weekly at [facility] in [place], currently on viibryd.   BP 130/80  Ht 5\' 8"  (1.727 m)  Wt 196 lb (88.905 kg)  BMI 29.81 kg/m2  LMP 11/10/2013  Appropriate time out taken. Nexplanon site identified.  Area prepped in usual sterile fashon. One cc of 2% lidocaine was used to anesthetize the area at the distal end of the implant. A small stab incision was made right beside the implant on the distal portion.  The Nexplanon rod was grasped using hemostats and removed without difficulty.  There was less than 3 cc blood loss. There were no complications.  Steri-strips were applied over the small incision and a pressure bandage was applied.  The patient tolerated the procedure well.  She was instructed to keep the area clean and dry, remove pressure bandage in 24 hours, and keep insertion site covered with the steri-strip for 3-5 days.    Rx lo loestrin w/ 11RF, 1 sample given Follow-up 3 months to see how she's doing on COCs, or sooner prn problems Encouraged smoking cessation  Marge DuncansBooker, Seretha Estabrooks Randall CNM, [facility] 11/11/2013 4:29 PM

## 2013-11-11 NOTE — Patient Instructions (Addendum)
Keep the area clean and dry.  You can remove the big bandage in 24 hours, and the small steri-strip bandage in 3-5 days.  A back up method, such as condoms, should be used for two weeks. If you have any concerns, please give us a call.   Ethinyl Estradiol; Norethindrone Acetate tablets (contraception)- Lo Loestrin What is this medicine? ETHINYL ESTRADIOL; NORETHINDRONE ACETATE (ETH in il es tra DYE ole; nor eth IN drone AS e tate) is an oral contraceptive. The products combine two types of female hormones, an estrogen and a progestin. They are used to prevent ovulation and pregnancy. This medicine may be used for other purposes; ask your health care provider or pharmacist if you have questions. COMMON BRAND NAME(S): Estrostep Fe, Gildess Fe 1.5/30, Gildess Fe 1/20, Gildess, Junel 1.5/30, Junel 1/20, Junel Fe 1.5/30, Junel Fe 1/20, Larin Fe, HartfordLARIN, Lo Loestrin Fe, Loestrin 1.5/30, Loestrin 1/20, Loestrin 24 Fe, Loestrin FE 1.5/30, Loestrin FE 1/20, Lomedia 24 Fe, Microgestin 1.5/30, Microgestin 1/20, Microgestin Fe 1.5/30, Microgestin Fe 1/20, Tilia Fe, Tri-Legest Fe What should I tell my health care provider before I take this medicine? They need to know if you have or ever had any of these conditions: -abnormal vaginal bleeding -blood vessel disease or blood clots -breast, cervical, endometrial, ovarian, liver, or uterine cancer -diabetes -gallbladder disease -heart disease or recent heart attack -high blood pressure -high cholesterol -kidney disease -liver disease -migraine headaches -stroke -systemic lupus erythematosus (SLE) -tobacco smoker -an unusual or allergic reaction to estrogens, progestins, other medicines, foods, dyes, or preservatives -pregnant or trying to get pregnant -breast-feeding How should I use this medicine? Take this medicine by mouth. To reduce nausea, this medicine may be taken with food. Follow the directions on the prescription label. Take this medicine at the  same time each day and in the order directed on the package. Do not take your medicine more often than directed. Contact your pediatrician regarding the use of this medicine in children. Special care may be needed. This medicine has been used in female children who have started having menstrual periods. A patient package insert for the product will be given with each prescription and refill. Read this sheet carefully each time. The sheet may change frequently. Overdosage: If you think you have taken too much of this medicine contact a poison control center or emergency room at once. NOTE: This medicine is only for you. Do not share this medicine with others. What if I miss a dose? If you miss a dose, refer to the patient information sheet you received with your medicine for direction. If you miss more than one pill, this medicine may not be as effective and you may need to use another form of birth control. What may interact with this medicine? -acetaminophen -antibiotics or medicines for infections, especially rifampin, rifabutin, rifapentine, and griseofulvin, and possibly penicillins or tetracyclines -aprepitant -ascorbic acid (vitamin C) -atorvastatin -barbiturate medicines, such as phenobarbital -bosentan -carbamazepine -caffeine -clofibrate -cyclosporine -dantrolene -doxercalciferol -felbamate -grapefruit juice -hydrocortisone -medicines for anxiety or sleeping problems, such as diazepam or temazepam -medicines for diabetes, including pioglitazone -mineral oil -modafinil -mycophenolate -nefazodone -oxcarbazepine -phenytoin -prednisolone -ritonavir or other medicines for HIV infection or AIDS -rosuvastatin -selegiline -soy isoflavones supplements -St. John's wort -tamoxifen or raloxifene -theophylline -thyroid hormones -topiramate -warfarin This list may not describe all possible interactions. Give your health care provider a list of all the medicines, herbs,  non-prescription drugs, or dietary supplements you use. Also tell them if you smoke, drink alcohol,  or use illegal drugs. Some items may interact with your medicine. What should I watch for while using this medicine? Visit your doctor or health care professional for regular checks on your progress. You will need a regular breast and pelvic exam and Pap smear while on this medicine. Use an additional method of contraception during the first cycle that you take these tablets. If you have any reason to think you are pregnant, stop taking this medicine right away and contact your doctor or health care professional. If you are taking this medicine for hormone related problems, it may take several cycles of use to see improvement in your condition. Smoking increases the risk of getting a blood clot or having a stroke while you are taking birth control pills, especially if you are more than 34 years old. You are strongly advised not to smoke. This medicine can make your body retain fluid, making your fingers, hands, or ankles swell. Your blood pressure can go up. Contact your doctor or health care professional if you feel you are retaining fluid. This medicine can make you more sensitive to the sun. Keep out of the sun. If you cannot avoid being in the sun, wear protective clothing and use sunscreen. Do not use sun lamps or tanning beds/booths. If you wear contact lenses and notice visual changes, or if the lenses begin to feel uncomfortable, consult your eye care specialist. In some women, tenderness, swelling, or minor bleeding of the gums may occur. Notify your dentist if this happens. Brushing and flossing your teeth regularly may help limit this. See your dentist regularly and inform your dentist of the medicines you are taking. If you are going to have elective surgery, you may need to stop taking this medicine before the surgery. Consult your health care professional for advice. This medicine does not  protect you against HIV infection (AIDS) or any other sexually transmitted diseases. What side effects may I notice from receiving this medicine? Side effects that you should report to your doctor or health care professional as soon as possible: -breast tissue changes or discharge -changes in vaginal bleeding during your period or between your periods -chest pain -coughing up blood -dizziness or fainting spells -headaches or migraines -leg, arm or groin pain -severe or sudden headaches -stomach pain (severe) -sudden shortness of breath -sudden loss of coordination, especially on one side of the body -speech problems -symptoms of vaginal infection like itching, irritation or unusual discharge -tenderness in the upper abdomen -vomiting -weakness or numbness in the arms or legs, especially on one side of the body -yellowing of the eyes or skin Side effects that usually do not require medical attention (report to your doctor or health care professional if they continue or are bothersome): -breakthrough bleeding and spotting that continues beyond the 3 initial cycles of pills -breast tenderness -mood changes, anxiety, depression, frustration, anger, or emotional outbursts -increased sensitivity to sun or ultraviolet light -nausea -skin rash, acne, or brown spots on the skin -weight gain (slight) This list may not describe all possible side effects. Call your doctor for medical advice about side effects. You may report side effects to FDA at 1-800-FDA-1088. Where should I keep my medicine? Keep out of the reach of children. Store at room temperature between 15 and 30 degrees C (59 and 86 degrees F). Throw away any unused medicine after the expiration date. NOTE: This sheet is a summary. It may not cover all possible information. If you have questions about this medicine, talk  to your doctor, pharmacist, or health care provider.  2014, Elsevier/Gold Standard. (2013-01-30 15:35:20)  Oral  Contraception Use Oral contraceptive pills (OCPs) are medicines taken to prevent pregnancy. OCPs work by preventing the ovaries from releasing eggs. The hormones in OCPs also cause the cervical mucus to thicken, preventing the sperm from entering the uterus. The hormones also cause the uterine lining to become thin, not allowing a fertilized egg to attach to the inside of the uterus. OCPs are highly effective when taken exactly as prescribed. However, OCPs do not prevent sexually transmitted diseases (STDs). Safe sex practices, such as using condoms along with an OCP, can help prevent STDs. Before taking OCPs, you may have a physical exam and Pap test. Your health care provider may also order blood tests if necessary. Your health care provider will make sure you are a good candidate for oral contraception. Discuss with your health care provider the possible side effects of the OCP you may be prescribed. When starting an OCP, it can take 2 to 3 months for the body to adjust to the changes in hormone levels in your body.  HOW TO TAKE ORAL CONTRACEPTIVE PILLS Your health care provider may advise you on how to start taking the first cycle of OCPs. Otherwise, you can:   Start on day 1 of your menstrual period. You will not need any backup contraceptive protection with this start time.   Start on the first Sunday after your menstrual period or the day you get your prescription. In these cases, you will need to use backup contraceptive protection for the first week.   Start the pill at any time of your cycle. If you take the pill within 5 days of the start of your period, you are protected against pregnancy right away. In this case, you will not need a backup form of birth control. If you start at any other time of your menstrual cycle, you will need to use another form of birth control for 7 days. If your OCP is the type called a minipill, it will protect you from pregnancy after taking it for 2 days (48  hours). After you have started taking OCPs:   If you forget to take 1 pill, take it as soon as you remember. Take the next pill at the regular time.   If you miss 2 or more pills, call your health care provider because different pills have different instructions for missed doses. Use backup birth control until your next menstrual period starts.   If you use a 28-day pack that contains inactive pills and you miss 1 of the last 7 pills (pills with no hormones), it will not matter. Throw away the rest of the nonhormone pills and start a new pill pack.  No matter which day you start the OCP, you will always start a new pack on that same day of the week. Have an extra pack of OCPs and a backup contraceptive method available in case you miss some pills or lose your OCP pack.  HOME CARE INSTRUCTIONS   Do not smoke.   Always use a condom to protect against STDs. OCPs do not protect against STDs.   Use a calendar to mark your menstrual period days.   Read the information and directions that came with your OCP. Talk to your health care provider if you have questions.  SEEK MEDICAL CARE IF:   You develop nausea and vomiting.   You have abnormal vaginal discharge or bleeding.  You develop a rash.   You miss your menstrual period.   You are losing your hair.   You need treatment for mood swings or depression.   You get dizzy when taking the OCP.   You develop acne from taking the OCP.   You become pregnant.  SEEK IMMEDIATE MEDICAL CARE IF:   You develop chest pain.   You develop shortness of breath.   You have an uncontrolled or severe headache.   You develop numbness or slurred speech.   You develop visual problems.   You develop pain, redness, and swelling in the legs.  Document Released: 09/13/2011 Document Revised: 05/27/2013 Document Reviewed: 03/15/2013 [facility] Patient Information 2014 [place], [place].

## 2013-11-12 LAB — POCT URINE PREGNANCY: PREG TEST UR: NEGATIVE

## 2014-02-08 ENCOUNTER — Ambulatory Visit: Payer: BC Managed Care – PPO | Admitting: Women's Health

## 2014-02-08 ENCOUNTER — Encounter: Payer: Self-pay | Admitting: *Deleted

## 2014-04-14 ENCOUNTER — Encounter: Payer: Self-pay | Admitting: Adult Health

## 2014-04-14 ENCOUNTER — Ambulatory Visit (INDEPENDENT_AMBULATORY_CARE_PROVIDER_SITE_OTHER): Payer: BC Managed Care – PPO | Admitting: Adult Health

## 2014-04-14 VITALS — BP 130/80 | Ht 68.0 in | Wt 206.5 lb

## 2014-04-14 DIAGNOSIS — Z3202 Encounter for pregnancy test, result negative: Secondary | ICD-10-CM

## 2014-04-14 DIAGNOSIS — F17200 Nicotine dependence, unspecified, uncomplicated: Secondary | ICD-10-CM

## 2014-04-14 DIAGNOSIS — N926 Irregular menstruation, unspecified: Secondary | ICD-10-CM

## 2014-04-14 DIAGNOSIS — N898 Other specified noninflammatory disorders of vagina: Secondary | ICD-10-CM | POA: Insufficient documentation

## 2014-04-14 HISTORY — DX: Irregular menstruation, unspecified: N92.6

## 2014-04-14 LAB — POCT WET PREP (WET MOUNT)
Trichomonas Wet Prep HPF POC: NEGATIVE
WBC WET PREP: POSITIVE

## 2014-04-14 LAB — POCT URINE PREGNANCY: PREG TEST UR: NEGATIVE

## 2014-04-14 NOTE — Progress Notes (Signed)
Subjective:     Patient ID: Miranda [name], female   DOB: 1980-12-23, 33 y.o.   MRN: [id number]  HPI Miranda [name] is a 33 year old white female in complaining of vaginal discharge, thought it was yeast but is better and complains of irregular periods with OCs.Is smoking daily, but would like to quit,she is on Fetzima for depression, will not chantix or Zyban.  Review of Systems See HPI Reviewed past medical,surgical, social and family history. Reviewed medications and allergies.     Objective:   Physical Exam BP 130/80  Ht 5\' 8"  (1.727 m)  Wt 206 lb 8 oz (93.668 kg)  BMI 31.41 kg/m2  LMP 02/16/2014  Breastfeeding? NoUPT negative, Skin warm and dry.Pelvic: external genitalia is normal in appearance, vagina: white discharge without odor, cervix:smooth and bulbous, uterus: normal size, shape and contour, non tender, no masses felt, adnexa: no masses or tenderness noted. Wet prep: +WBCs. GC/CHL obtained.    Discussed taper cigarettes every 2 weeks to stop in about 6 weeks. Discussed not unusual to skip period with OCs.  Assessment:     Vaginal discharge Irregular periods with OCs Smoker     Plan:    GC/CHL sent Taper cigarettes with plan to quit in 6 weeks, review handout on smoking cesssation Follow up prn Continue OCs

## 2014-04-14 NOTE — Patient Instructions (Signed)
Smoking Cessation Quitting smoking is important to your health and has many advantages. However, it is not always easy to quit since nicotine is a very addictive drug. Often times, people try 3 times or more before being able to quit. This document explains the best ways for you to prepare to quit smoking. Quitting takes hard work and a lot of effort, but you can do it. ADVANTAGES OF QUITTING SMOKING  You will live longer, feel better, and live better.  Your body will feel the impact of quitting smoking almost immediately.  Within 20 minutes, blood pressure decreases. Your pulse returns to its normal level.  After 8 hours, carbon monoxide levels in the blood return to normal. Your oxygen level increases.  After 24 hours, the chance of having a heart attack starts to decrease. Your breath, hair, and body stop smelling like smoke.  After 48 hours, damaged nerve endings begin to recover. Your sense of taste and smell improve.  After 72 hours, the body is virtually free of nicotine. Your bronchial tubes relax and breathing becomes easier.  After 2 to 12 weeks, lungs can hold more air. Exercise becomes easier and circulation improves.  The risk of having a heart attack, stroke, cancer, or lung disease is greatly reduced.  After 1 year, the risk of coronary heart disease is cut in half.  After 5 years, the risk of stroke falls to the same as a nonsmoker.  After 10 years, the risk of lung cancer is cut in half and the risk of other cancers decreases significantly.  After 15 years, the risk of coronary heart disease drops, usually to the level of a nonsmoker.  If you are pregnant, quitting smoking will improve your chances of having a healthy baby.  The people you live with, especially any children, will be healthier.  You will have extra money to spend on things other than cigarettes. QUESTIONS TO THINK ABOUT BEFORE ATTEMPTING TO QUIT You may want to talk about your answers with your  caregiver.  Why do you want to quit?  If you tried to quit in the past, what helped and what did not?  What will be the most difficult situations for you after you quit? How will you plan to handle them?  Who can help you through the tough times? Your family? Friends? A caregiver?  What pleasures do you get from smoking? What ways can you still get pleasure if you quit? Here are some questions to ask your caregiver:  How can you help me to be successful at quitting?  What medicine do you think would be best for me and how should I take it?  What should I do if I need more help?  What is smoking withdrawal like? How can I get information on withdrawal? GET READY  Set a quit date.  Change your environment by getting rid of all cigarettes, ashtrays, matches, and lighters in your home, car, or work. Do not let people smoke in your home.  Review your past attempts to quit. Think about what worked and what did not. GET SUPPORT AND ENCOURAGEMENT You have a better chance of being successful if you have help. You can get support in many ways.  Tell your family, friends, and co-workers that you are going to quit and need their support. Ask them not to smoke around you.  Get individual, group, or telephone counseling and support. Programs are available at local hospitals and health centers. Call your local health department for   information about programs in your area.  Spiritual beliefs and practices may help some smokers quit.  Download a "quit meter" on your computer to keep track of quit statistics, such as how long you have gone without smoking, cigarettes not smoked, and money saved.  Get a self-help book about quitting smoking and staying off of tobacco. LEARN NEW SKILLS AND BEHAVIORS  Distract yourself from urges to smoke. Talk to someone, go for a walk, or occupy your time with a task.  Change your normal routine. Take a different route to work. Drink tea instead of coffee.  Eat breakfast in a different place.  Reduce your stress. Take a hot bath, exercise, or read a book.  Plan something enjoyable to do every day. Reward yourself for not smoking.  Explore interactive web-based programs that specialize in helping you quit. GET MEDICINE AND USE IT CORRECTLY Medicines can help you stop smoking and decrease the urge to smoke. Combining medicine with the above behavioral methods and support can greatly increase your chances of successfully quitting smoking.  Nicotine replacement therapy helps deliver nicotine to your body without the negative effects and risks of smoking. Nicotine replacement therapy includes nicotine gum, lozenges, inhalers, nasal sprays, and skin patches. Some may be available over-the-counter and others require a prescription.  Antidepressant medicine helps people abstain from smoking, but how this works is unknown. This medicine is available by prescription.  Nicotinic receptor partial agonist medicine simulates the effect of nicotine in your brain. This medicine is available by prescription. Ask your caregiver for advice about which medicines to use and how to use them based on your health history. Your caregiver will tell you what side effects to look out for if you choose to be on a medicine or therapy. Carefully read the information on the package. Do not use any other product containing nicotine while using a nicotine replacement product.  RELAPSE OR DIFFICULT SITUATIONS Most relapses occur within the first 3 months after quitting. Do not be discouraged if you start smoking again. Remember, most people try several times before finally quitting. You may have symptoms of withdrawal because your body is used to nicotine. You may crave cigarettes, be irritable, feel very hungry, cough often, get headaches, or have difficulty concentrating. The withdrawal symptoms are only temporary. They are strongest when you first quit, but they will go away within  10-14 days. To reduce the chances of relapse, try to:  Avoid drinking alcohol. Drinking lowers your chances of successfully quitting.  Reduce the amount of caffeine you consume. Once you quit smoking, the amount of caffeine in your body increases and can give you symptoms, such as a rapid heartbeat, sweating, and anxiety.  Avoid smokers because they can make you want to smoke.  Do not let weight gain distract you. Many smokers will gain weight when they quit, usually less than 10 pounds. Eat a healthy diet and stay active. You can always lose the weight gained after you quit.  Find ways to improve your mood other than smoking. FOR MORE INFORMATION  www.smokefree.gov  Document Released: 09/18/2001 Document Revised: 03/25/2012 Document Reviewed: 01/03/2012 [facility] Patient Information 2015 [place], [place]. This information is not intended to replace advice given to you by your health care provider. Make sure you discuss any questions you have with your health care provider. Try to taper cigarettes

## 2014-04-15 LAB — GC/CHLAMYDIA PROBE AMP
CT PROBE, AMP APTIMA: NEGATIVE
GC Probe RNA: NEGATIVE

## 2014-06-01 ENCOUNTER — Encounter (HOSPITAL_COMMUNITY): Payer: Self-pay | Admitting: Emergency Medicine

## 2014-06-01 ENCOUNTER — Emergency Department (HOSPITAL_COMMUNITY)
Admission: EM | Admit: 2014-06-01 | Discharge: 2014-06-01 | Disposition: A | Discharge status: Home / Self Care | Payer: BC Managed Care – PPO | Attending: Emergency Medicine | Admitting: Emergency Medicine

## 2014-06-01 DIAGNOSIS — R1013 Epigastric pain: Secondary | ICD-10-CM | POA: Insufficient documentation

## 2014-06-01 DIAGNOSIS — E119 Type 2 diabetes mellitus without complications: Secondary | ICD-10-CM | POA: Insufficient documentation

## 2014-06-01 DIAGNOSIS — Z8659 Personal history of other mental and behavioral disorders: Secondary | ICD-10-CM | POA: Insufficient documentation

## 2014-06-01 DIAGNOSIS — E079 Disorder of thyroid, unspecified: Secondary | ICD-10-CM | POA: Insufficient documentation

## 2014-06-01 DIAGNOSIS — F17200 Nicotine dependence, unspecified, uncomplicated: Secondary | ICD-10-CM | POA: Diagnosis not present

## 2014-06-01 DIAGNOSIS — Z794 Long term (current) use of insulin: Secondary | ICD-10-CM | POA: Diagnosis not present

## 2014-06-01 DIAGNOSIS — R197 Diarrhea, unspecified: Secondary | ICD-10-CM | POA: Insufficient documentation

## 2014-06-01 DIAGNOSIS — J029 Acute pharyngitis, unspecified: Secondary | ICD-10-CM | POA: Insufficient documentation

## 2014-06-01 DIAGNOSIS — R509 Fever, unspecified: Secondary | ICD-10-CM | POA: Insufficient documentation

## 2014-06-01 DIAGNOSIS — Z8619 Personal history of other infectious and parasitic diseases: Secondary | ICD-10-CM | POA: Insufficient documentation

## 2014-06-01 DIAGNOSIS — Z3202 Encounter for pregnancy test, result negative: Secondary | ICD-10-CM | POA: Insufficient documentation

## 2014-06-01 DIAGNOSIS — R079 Chest pain, unspecified: Secondary | ICD-10-CM | POA: Insufficient documentation

## 2014-06-01 DIAGNOSIS — R112 Nausea with vomiting, unspecified: Secondary | ICD-10-CM | POA: Diagnosis present

## 2014-06-01 DIAGNOSIS — Z79899 Other long term (current) drug therapy: Secondary | ICD-10-CM | POA: Diagnosis not present

## 2014-06-01 LAB — URINALYSIS, ROUTINE W REFLEX MICROSCOPIC
BILIRUBIN URINE: NEGATIVE
Glucose, UA: NEGATIVE mg/dL
Hgb urine dipstick: NEGATIVE
Ketones, ur: NEGATIVE mg/dL
Leukocytes, UA: NEGATIVE
NITRITE: NEGATIVE
PH: 6 (ref 5.0–8.0)
Protein, ur: NEGATIVE mg/dL
SPECIFIC GRAVITY, URINE: 1.025 (ref 1.005–1.030)
UROBILINOGEN UA: 0.2 mg/dL (ref 0.0–1.0)

## 2014-06-01 LAB — PREGNANCY, URINE: PREG TEST UR: NEGATIVE

## 2014-06-01 MED ORDER — OXYCODONE-ACETAMINOPHEN 5-325 MG PO TABS: 1.0000 | ORAL_TABLET | Freq: Four times a day (QID) | ORAL | Status: DC | PRN

## 2014-06-01 MED ORDER — ONDANSETRON HCL 4 MG/2ML IJ SOLN
4.0000 mg | Freq: Once | INTRAMUSCULAR | Status: AC
  Administered 2014-06-01: 4 mg via INTRAVENOUS
  Filled 2014-06-01: qty 2

## 2014-06-01 MED ORDER — ONDANSETRON 8 MG PO TBDP: 8.0000 mg | ORAL_TABLET | Freq: Three times a day (TID) | ORAL | Status: DC | PRN

## 2014-06-01 MED ORDER — SODIUM CHLORIDE 0.9 % IV BOLUS (SEPSIS)
1000.0000 mL | Freq: Once | INTRAVENOUS | Status: AC
  Administered 2014-06-01: 1000 mL via INTRAVENOUS

## 2014-06-01 NOTE — Discharge Instructions (Signed)

## 2014-06-01 NOTE — ED Provider Notes (Signed)
CSN: [id number]     Arrival date & time 06/01/14  1354 History  This chart was scribed for [employer]. Rubin Payor, MD by Luisa Dago, ED Scribe. This patient was seen in room APA09/APA09 and the patient's care was started at 2:48 PM.    Chief Complaint  Patient presents with  . Emesis   The history is provided by the patient. No language interpreter was used.   HPI Comments: Miranda [name] is a 33 y.o. female who presents to the Emergency Department complaining of several episodes of emesis for the past 2 days. She is also complaining of associated nausea,diarrhea, fever, chills, and generalized abdominal pain. Current ED temperature is 98.2. She is also complaining of a sore throat and chest pain secondary to the several episodes of emesis. Pt states that the smell of food makes her nauseous. She denies any sick contacts, dysuria, congestion, weakness, or myalgias.  Past Medical History  Diagnosis Date  . Thyroid disease   . Pregnant   . Heart valve problem   . Palpitation   . Chest pain 07/24/2012    2D Echo EF 60%-65% which shoewd a bicuspid aortic vavle with mild stenosis  . PVC (premature ventricular contraction) 07/24/2012    stress test which was normal with good excercise  . Diabetes mellitus     type 2 on insulin  . Herpes   . Mental disorder     depression  . Vaginal discharge 04/14/2014  . Irregular periods 04/14/2014   Past Surgical History  Procedure Laterality Date  . No past surgeries     Family History  Problem Relation Age of Onset  . Hypertension Mother   . Diabetes Brother   . Diabetes Maternal Grandmother   . Congestive Heart Failure Maternal Grandmother    History  Substance Use Topics  . Smoking status: Current Every Day Smoker -- 0.50 packs/day for 5 years    Types: Cigarettes    Last Attempt to Quit: 05/20/2012  . Smokeless tobacco: Never Used  . Alcohol Use: No   OB History   Grav Para Term Preterm Abortions TAB SAB Ect Mult Living   Review of Systems  Constitutional: Positive for fever and chills. Negative for diaphoresis.  HENT: Positive for sore throat. Negative for congestion.   Respiratory: Negative for cough and shortness of breath.   Cardiovascular: Negative for chest pain.  Gastrointestinal: Positive for nausea, vomiting, abdominal pain and diarrhea.  Genitourinary: Positive for decreased urine volume (not drinking much). Negative for dysuria and hematuria.  Neurological: Negative for weakness.  All other systems reviewed and are negative.  Allergies  Keflex  Home Medications   Prior to Admission medications   Medication Sig Start Date End Date Taking? Authorizing Provider  Levomilnacipran HCl ER (FETZIMA) 120 MG CP24 Take 120 mg by mouth daily.   Yes Historical Provider, MD  levothyroxine (SYNTHROID, LEVOTHROID) 125 MCG tablet Take 125 mcg by mouth daily before breakfast.   Yes Historical Provider, MD  metFORMIN (GLUCOPHAGE-XR) 500 MG 24 hr tablet Take 1,000 mg by mouth daily with breakfast.   Yes Historical Provider, MD  Norethindrone-Ethinyl Estradiol-Fe Biphas (LO LOESTRIN FE) 1 MG-10 MCG / 10 MCG tablet Take 1 tablet by mouth daily. 11/11/13  Yes [name], CNM  ondansetron (ZOFRAN-ODT) 8 MG disintegrating tablet Take 1 tablet (8 mg total) by mouth every 8 (eight) hours as needed for nausea or vomiting.  06/01/14   Juliet Rude. Belvia Gotschall, MD  oxyCODONE-acetaminophen (PERCOCET/ROXICET) 5-325 MG per tablet Take 1-2 tablets by mouth every 6 (six) hours as needed for severe pain. 06/01/14   Juliet Rude. Rubin Payor, MD   Triage vitals: BP 127/84  Pulse 103  Temp(Src) 98.2 F (36.8 C) (Oral)  Resp 18  Ht  (1.727 m)  Wt 195 lb (88.451 kg)  BMI 29.66 kg/m2  SpO2 100%  LMP 05/25/2014  Physical Exam  Nursing note and vitals reviewed. Constitutional: She is oriented to person, place, and time. She appears well-developed and well-nourished. No distress.  Pt appears uncomfortable.  HENT:   Head: Normocephalic and atraumatic.  Eyes: Conjunctivae are normal. Right eye exhibits no discharge. Left eye exhibits no discharge.  Neck: Neck supple. No tracheal deviation present.  Cardiovascular: Normal rate, regular rhythm and normal heart sounds.  Exam reveals no gallop and no friction rub.   No murmur heard. Pulmonary/Chest: Effort normal and breath sounds normal. No respiratory distress.  Abdominal: Soft. She exhibits no distension. There is tenderness. There is no rebound and no guarding.  Mild lower abdominal tenderness.  Musculoskeletal: Normal range of motion. She exhibits no edema and no tenderness.  Neurological: She is alert and oriented to person, place, and time.  Skin: Skin is warm and dry.  Psychiatric: She has a normal mood and affect. Her behavior is normal. Thought content normal.    ED Course  Procedures (including critical care time)  2:54 PM- Pt advised of plan for treatment and pt agrees.  Labs Review Labs Reviewed  URINALYSIS, ROUTINE W REFLEX MICROSCOPIC  PREGNANCY, URINE    Imaging Review No results found.   EKG Interpretation None      MDM   Final diagnoses:  Nausea vomiting and diarrhea    Patient with nausea vomiting diarrhea. Likely gastroenteritis. Patient feels somewhat better after treatment. No severe dehydration on urinalysis. Will discharge home. Patient is not pregnant, despite past medical histor stating it I personally performed the services described in this documentation, which was scribed in my presence. The recorded information has been reviewed and is accurate.    Juliet Rude. Rubin Payor, MD 06/01/14 2136

## 2014-06-01 NOTE — ED Notes (Signed)
Pt co N/V/D x 2 days, also co generalized abdominal pain.

## 2014-06-24 ENCOUNTER — Other Ambulatory Visit (HOSPITAL_COMMUNITY): Payer: Self-pay | Admitting: Family Medicine

## 2014-06-24 DIAGNOSIS — Z713 Dietary counseling and surveillance: Secondary | ICD-10-CM

## 2014-06-24 DIAGNOSIS — E119 Type 2 diabetes mellitus without complications: Secondary | ICD-10-CM

## 2014-06-24 DIAGNOSIS — E039 Hypothyroidism, unspecified: Secondary | ICD-10-CM

## 2014-06-24 DIAGNOSIS — R519 Headache, unspecified: Secondary | ICD-10-CM

## 2014-06-24 DIAGNOSIS — R51 Headache: Principal | ICD-10-CM

## 2014-06-25 ENCOUNTER — Ambulatory Visit (HOSPITAL_COMMUNITY): Payer: BC Managed Care – PPO | Attending: Family Medicine

## 2014-06-25 ENCOUNTER — Other Ambulatory Visit: Payer: Self-pay | Admitting: Obstetrics & Gynecology

## 2014-07-01 ENCOUNTER — Ambulatory Visit (HOSPITAL_COMMUNITY): Payer: BC Managed Care – PPO | Attending: Family Medicine

## 2014-08-09 ENCOUNTER — Encounter (HOSPITAL_COMMUNITY): Payer: Self-pay | Admitting: Emergency Medicine

## 2014-09-14 ENCOUNTER — Encounter: Payer: Self-pay | Admitting: Advanced Practice Midwife

## 2014-09-14 ENCOUNTER — Ambulatory Visit (INDEPENDENT_AMBULATORY_CARE_PROVIDER_SITE_OTHER): Payer: BC Managed Care – PPO | Admitting: Advanced Practice Midwife

## 2014-09-14 VITALS — BP 138/90 | Ht 68.0 in | Wt 209.8 lb

## 2014-09-14 DIAGNOSIS — N898 Other specified noninflammatory disorders of vagina: Secondary | ICD-10-CM

## 2014-09-14 DIAGNOSIS — B9689 Other specified bacterial agents as the cause of diseases classified elsewhere: Secondary | ICD-10-CM

## 2014-09-14 DIAGNOSIS — N760 Acute vaginitis: Secondary | ICD-10-CM

## 2014-09-14 DIAGNOSIS — A499 Bacterial infection, unspecified: Secondary | ICD-10-CM

## 2014-09-14 MED ORDER — METRONIDAZOLE 500 MG PO TABS: 500.0000 mg | ORAL_TABLET | Freq: Two times a day (BID) | ORAL | Status: DC

## 2014-09-14 NOTE — Progress Notes (Signed)
[facility] Visit  Patient name: Miranda AlamoMary S [name] MRN [id number]  Date of birth: Dec 18, 1980  CC & HPI:  Miranda [name] is a 33 y.o. Caucasian female presenting today for vaginal itching and burning for a week.  On her 5th day of OTC monistat, feeling better today.  Feels like she's going to start her period  Pertinent History Reviewed:  Medical & Surgical Hx:   Past Medical History  Diagnosis Date  . Thyroid disease   . Pregnant   . Heart valve problem   . Palpitation   . Chest pain 07/24/2012    2D Echo EF 60%-65% which shoewd a bicuspid aortic vavle with mild stenosis  . PVC (premature ventricular contraction) 07/24/2012    stress test which was normal with good excercise  . Diabetes mellitus     type 2 on insulin  . Herpes   . Mental disorder     depression  . Vaginal discharge 04/14/2014  . Irregular periods 04/14/2014   Past Surgical History  Procedure Laterality Date  . No past surgeries     Medications: Reviewed & Updated - see associated section Social History: Reviewed -  reports that she has been smoking Cigarettes.  She has a 1.25 pack-year smoking history. She has never used smokeless tobacco.  Objective Findings:  Vitals: BP 138/90 mmHg  Ht 5\' 8"  (1.727 m)  Wt 209 lb 12.8 oz (95.165 kg)  BMI 31.91 kg/m2  LMP 08/22/2014  Physical Examination: General appearance - alert, well appearing, and in no distress Mental status - alert, oriented to person, place, and time Pelvic - Vulva red; SSE:  Thin frothy white dc.  Wet prep few clues, no yeast, trich or WBC  No results found for this or any previous visit (from the past 24 hour(s)).   Assessment & Plan:  A:   BV P:  Flagyl 500mg  BID X7  Gc/CHL collected   CRESENZO-DISHMAN,Adrea Sherpa CNM 09/14/2014 11:33 AM

## 2014-09-14 NOTE — Patient Instructions (Signed)
Bacterial Vaginosis Bacterial vaginosis is a vaginal infection that occurs when the normal balance of bacteria in the vagina is disrupted. It results from an overgrowth of certain bacteria. This is the most common vaginal infection in women of childbearing age. Treatment is important to prevent complications, especially in pregnant women, as it can cause a premature delivery. CAUSES  Bacterial vaginosis is caused by an increase in harmful bacteria that are normally present in smaller amounts in the vagina. Several different kinds of bacteria can cause bacterial vaginosis. However, the reason that the condition develops is not fully understood. RISK FACTORS Certain activities or behaviors can put you at an increased risk of developing bacterial vaginosis, including:  Having a new sex partner or multiple sex partners.  Douching.  Using an intrauterine device (IUD) for contraception. Women do not get bacterial vaginosis from toilet seats, bedding, swimming pools, or contact with objects around them. SIGNS AND SYMPTOMS  Some women with bacterial vaginosis have no signs or symptoms. Common symptoms include:  Grey vaginal discharge.  A fishlike odor with discharge, especially after sexual intercourse.  Itching or burning of the vagina and vulva.  Burning or pain with urination. DIAGNOSIS  Your health care provider will take a medical history and examine the vagina for signs of bacterial vaginosis. A sample of vaginal fluid may be taken. Your health care provider will look at this sample under a microscope to check for bacteria and abnormal cells. A vaginal pH test may also be done.  TREATMENT  Bacterial vaginosis may be treated with antibiotic medicines. These may be given in the form of a pill or a vaginal cream. A second round of antibiotics may be prescribed if the condition comes back after treatment.  HOME CARE INSTRUCTIONS   Only take over-the-counter or prescription medicines as  directed by your health care provider.  If antibiotic medicine was prescribed, take it as directed. Make sure you finish it even if you start to feel better.  Do not have sex until treatment is completed.  Tell all sexual partners that you have a vaginal infection. They should see their health care provider and be treated if they have problems, such as a mild rash or itching.  Practice safe sex by using condoms and only having one sex partner. SEEK MEDICAL CARE IF:   Your symptoms are not improving after 3 days of treatment.  You have increased discharge or pain.  You have a fever. MAKE SURE YOU:   Understand these instructions.  Will watch your condition.  Will get help right away if you are not doing well or get worse. FOR MORE INFORMATION  Centers for Disease Control and Prevention, Division of STD Prevention: www.cdc.gov/std American Sexual Health Association (ASHA): www.ashastd.org  Document Released: 09/24/2005 Document Revised: 07/15/2013 Document Reviewed: 05/06/2013 ExitCare Patient Information 2015 ExitCare, LLC. This information is not intended to replace advice given to you by your health care provider. Make sure you discuss any questions you have with your health care provider.  

## 2014-09-15 LAB — GC/CHLAMYDIA PROBE AMP
CT Probe RNA: NEGATIVE
GC Probe RNA: NEGATIVE

## 2014-11-03 ENCOUNTER — Telehealth: Payer: Self-pay | Admitting: *Deleted

## 2014-11-03 ENCOUNTER — Other Ambulatory Visit: Payer: Self-pay | Admitting: Women's Health

## 2014-11-03 NOTE — Telephone Encounter (Signed)
Pt informed that BCP was refilled for 6 months and then needs an office visit before any more refills will be done, pt verbalized understanding.

## 2015-02-03 ENCOUNTER — Ambulatory Visit (INDEPENDENT_AMBULATORY_CARE_PROVIDER_SITE_OTHER): Payer: Medicaid Other | Admitting: Obstetrics and Gynecology

## 2015-02-03 ENCOUNTER — Encounter: Payer: Self-pay | Admitting: Obstetrics and Gynecology

## 2015-02-03 VITALS — BP 110/72 | Ht 68.0 in

## 2015-02-03 DIAGNOSIS — R350 Frequency of micturition: Secondary | ICD-10-CM | POA: Diagnosis not present

## 2015-02-03 LAB — POCT URINALYSIS DIPSTICK
Blood, UA: NEGATIVE
Glucose, UA: NEGATIVE
Ketones, UA: NEGATIVE
LEUKOCYTES UA: NEGATIVE
Nitrite, UA: NEGATIVE
Protein, UA: NEGATIVE

## 2015-02-03 MED ORDER — NITROFURANTOIN MONOHYD MACRO 100 MG PO CAPS: 100.0000 mg | ORAL_CAPSULE | Freq: Two times a day (BID) | ORAL | Status: DC

## 2015-02-03 MED ORDER — OXYBUTYNIN CHLORIDE 5 MG PO TABS: 5.0000 mg | ORAL_TABLET | Freq: Two times a day (BID) | ORAL | Status: DC

## 2015-02-03 NOTE — Progress Notes (Signed)
Patient ID: Miranda [name] Humes, female   DOB: [birth year], 34 y.o.   MRN: [id number]  This chart was scribed for Tilda BurrowJohn Juanisha Bautch V, MD by Carl Bestelina Holson, ED Scribe. This patient was seen in Room 1 and the patient'[name] care was started at 4:40 PM.    [facility]  Patient name: Miranda [name] Smeltz MRN [id number]  Date of birth: [birth year]  CC & HPI:  Miranda [name] Shek is a 34 y.o. female with a history of DM presenting today for constant urinary frequency that started two days ago after she finished her menses.  She uses tampons during her menses.  She states that she will experience urinary frequency up until a week after tampon use during her menses.  She experiences urinary frequency throughout the day.  She has a history of UTIs and yeast infections.  She denies vaginal discharge as an associated symptom.    ROS:  All systems have been reviewed and are negative unless otherwise indicated in the HPI.   Pertinent History Reviewed:   Reviewed: Significant for  Medical         Past Medical History  Diagnosis Date  . Thyroid disease   . Pregnant   . Heart valve problem   . Palpitation   . Chest pain 07/24/2012    2D Echo EF 60%-65% which shoewd a bicuspid aortic vavle with mild stenosis  . PVC (premature ventricular contraction) 07/24/2012    stress test which was normal with good excercise  . Diabetes mellitus     type 2 on insulin  . Herpes   . Mental disorder     depression  . Vaginal discharge 04/14/2014  . Irregular periods 04/14/2014                              Surgical Hx:    Past Surgical History  Procedure Laterality Date  . No past surgeries     Medications: Reviewed & Updated - see associated section                       Current outpatient prescriptions:  .  levothyroxine (SYNTHROID, LEVOTHROID) 125 MCG tablet, Take 125 mcg by mouth daily before breakfast., Disp: , Rfl:  .  LO LOESTRIN FE 1 MG-10 MCG / 10 MCG tablet, take 1 tablet by mouth once daily, Disp: 28 tablet, Rfl:  5 .  metFORMIN (GLUCOPHAGE-XR) 500 MG 24 hr tablet, Take 1,000 mg by mouth daily with breakfast., Disp: , Rfl:  .  SUMAtriptan (IMITREX) 100 MG tablet, , Disp: , Rfl: 0   Social History: Reviewed -  reports that she has been smoking Cigarettes.  She has a 1.25 pack-year smoking history. She has never used smokeless tobacco.  Objective Findings:  Vitals: Blood pressure 110/72, height 5\' 8"  (1.727 m), last menstrual period 01/20/2015, not currently breastfeeding.  Physical Examination: Discussion only.   Assessment & Plan:   A:  1. Recurrent urge incontinence 2. Recurrent UTIs after tampon use 3. DM  P:  1. Macrobid Rx 2. 5mg  Ditropan Rx    I personally performed the services described in this documentation, which was SCRIBED in my presence. The recorded information has been reviewed and considered accurate. It has been edited as necessary during review. Tilda BurrowFERGUSON,Reis Pienta V, MD

## 2015-02-03 NOTE — Progress Notes (Signed)
Patient ID: Miranda AlamoMary S Rockman, female   DOB: [birth year], 34 y.o.   MRN: [id number] Pt here today for possible UTI. Pt states that she has urinary frequency for about a week. Pt denies any burning or itching at this time.

## 2015-03-30 ENCOUNTER — Other Ambulatory Visit: Payer: Self-pay | Admitting: Obstetrics & Gynecology

## 2015-04-12 ENCOUNTER — Encounter: Payer: Self-pay | Admitting: *Deleted

## 2015-05-01 ENCOUNTER — Other Ambulatory Visit: Payer: Self-pay | Admitting: Women's Health

## 2015-05-11 ENCOUNTER — Ambulatory Visit (INDEPENDENT_AMBULATORY_CARE_PROVIDER_SITE_OTHER): Payer: Medicaid Other | Admitting: Women's Health

## 2015-05-11 ENCOUNTER — Encounter: Payer: Self-pay | Admitting: Women's Health

## 2015-05-11 VITALS — BP 124/84 | HR 64 | Ht 68.0 in | Wt 201.0 lb

## 2015-05-11 DIAGNOSIS — Z3041 Encounter for surveillance of contraceptive pills: Secondary | ICD-10-CM

## 2015-05-11 DIAGNOSIS — Z0000 Encounter for general adult medical examination without abnormal findings: Secondary | ICD-10-CM

## 2015-05-11 DIAGNOSIS — Z01419 Encounter for gynecological examination (general) (routine) without abnormal findings: Secondary | ICD-10-CM

## 2015-05-11 DIAGNOSIS — Z113 Encounter for screening for infections with a predominantly sexual mode of transmission: Secondary | ICD-10-CM

## 2015-05-11 MED ORDER — NORETHINDRONE 0.35 MG PO TABS: ORAL_TABLET | ORAL | Status: DC

## 2015-05-11 NOTE — Progress Notes (Signed)
Patient ID: Miranda [name], female   DOB: 07/25/1981, 34 y.o.   MRN: [id number] Subjective:   Miranda [name] is a 34 y.o. G49P1001 Caucasian female here for a routine well-woman exam.  Patient's last menstrual period was 02/21/2015 (approximate).    Current complaints: boyfriend has been unfaithful, she has left him, but wants STD screening.  Turns 34 8/20, smokes 1ppd, on LoLoestrin currently, will need to switch to progestin-only method- discussed options, wants POPs, is good at taking pills, will also set alarm to remind her States bp has been elevated lately, but she has also been very stressed d/t all that is going on w/ boyfriend. BP fine today.  PCP: Robbie Lis, they do her labs including A1C and TSH- she said both were fine        Does desire STD labs  Social History: Sexual: heterosexual Marital Status: single Living situation: with daughter  Occupation: unemployed Tobacco/alcohol: tobacco 1ppd, etoh: occ Illicit drugs: no history of illicit drug use  The following portions of the patient's history were reviewed and updated as appropriate: allergies, current medications, past family history, past medical history, past social history, past surgical history and problem list.  Past Medical History Past Medical History  Diagnosis Date  . Thyroid disease   . Pregnant   . Heart valve problem   . Palpitation   . Chest pain 07/24/2012    2D Echo EF 60%-65% which shoewd a bicuspid aortic vavle with mild stenosis  . PVC (premature ventricular contraction) 07/24/2012    stress test which was normal with good excercise  . Diabetes mellitus     type 2 on insulin  . Herpes   . Mental disorder     depression  . Vaginal discharge 04/14/2014  . Irregular periods 04/14/2014    Past Surgical History Past Surgical History  Procedure Laterality Date  . No past surgeries      Gynecologic History G1P1001  Patient's last menstrual period was 02/21/2015 (approximate). Contraception: currently on  LoLoestrin, will be switching to POPs Last Pap: 2015. Results were: normal Last mammogram: never. Results were: n/a Last TCS: never  Obstetric History OB History  Gravida Para Term Preterm AB SAB TAB Ectopic Multiple Living  # Outcome Date GA Lbr Len/2nd Weight Sex Delivery Anes PTL Lv  1 Term 07/26/13 [redacted]w[redacted]d -03:03 / 04:26 7 lb 2.3 oz (3.24 kg) F Vag-Vacuum EPI  Y     Comments: caput      Current Medications Current Outpatient Prescriptions on File Prior to Visit  Medication Sig Dispense Refill  . levothyroxine (SYNTHROID, LEVOTHROID) 125 MCG tablet Take 125 mcg by mouth daily before breakfast.    . LO LOESTRIN FE 1 MG-10 MCG / 10 MCG tablet take 1 tablet by mouth once daily 28 tablet 0  . metFORMIN (GLUCOPHAGE-XR) 500 MG 24 hr tablet Take 1,000 mg by mouth daily with breakfast.    . SUMAtriptan (IMITREX) 100 MG tablet   0  . acyclovir (ZOVIRAX) 400 MG tablet take 1 tablet by mouth three times a day (Patient not taking: Reported on 05/11/2015) 90 tablet 3  . nitrofurantoin, macrocrystal-monohydrate, (MACROBID) 100 MG capsule Take 1 capsule (100 mg total) by mouth 2 (two) times daily. For uti, for recurrent uti symptoms (Patient not taking: Reported on 05/11/2015) 30 capsule 1  . oxybutynin (DITROPAN) 5 MG tablet Take 1 tablet (5 mg total) by mouth 2 (two) times daily. (  Patient not taking: Reported on 05/11/2015) 60 tablet 3   No current facility-administered medications on file prior to visit.    Review of Systems Patient denies any headaches, blurred vision, shortness of breath, chest pain, abdominal pain, problems with bowel movements, urination, or intercourse.  Objective:  BP 124/84 mmHg  Pulse 64  Ht 5\' 8"  (1.727 m)  Wt 201 lb (91.173 kg)  BMI 30.57 kg/m2  LMP 02/21/2015 (Approximate) Physical Exam  General:  Well developed, well nourished, no acute distress. She is alert and oriented x3. Skin:  Warm and dry Neck:  Midline trachea, no thyromegaly or  nodules Cardiovascular: Regular rate and rhythm, no murmur heard Lungs:  Effort normal, all lung fields clear to auscultation bilaterally Breasts:  No dominant palpable mass, retraction, or nipple discharge Abdomen:  Soft, non tender, no hepatosplenomegaly or masses Pelvic:  External genitalia is normal in appearance.  The vagina is normal in appearance. The cervix is bulbous, no CMT.  Thin prep pap is not done. Uterus is felt to be normal size, shape, and contour.  No adnexal masses or tenderness noted. Extremities:  No swelling or varicosities noted Psych:  She has a normal mood and affect  Assessment:   Healthy well-woman exam Smoker STD screen Contraception management Reports recent elevated bp's DM Type 2 on metformin Thyroid disease  Plan:  Rx micronor w/ 11RF, to begin when finished w/ current pack of LoLoestrin GC/CT from urine, HIV, RPR, HepB&C, HSV2 today To check bp's at home bid, keep log, if consistently elevated let PCP know Advised smoking cessation, not ready yet F/U 34yr for physical, or sooner if needed Mammogram @35yo  or sooner if problems Colonoscopy @35yo  or sooner if problems  [name] CNM, WHNP-BC 05/11/2015 9:10 AM

## 2015-05-11 NOTE — Patient Instructions (Signed)
Norethindrone tablets (contraception) What is this medicine? NORETHINDRONE (nor eth IN drone) is an oral contraceptive. The product contains a female hormone known as a progestin. It is used to prevent pregnancy. This medicine may be used for other purposes; ask your health care provider or pharmacist if you have questions. COMMON BRAND NAME(S): Camila, Deblitane 28-Day, Errin, Heather, Jencycla, Jolivette, Lyza, Nor-QD, Nora-BE, Norlyroc, Ortho Micronor, Sharobel 28-Day What should I tell my health care provider before I take this medicine? They need to know if you have any of these conditions: -blood vessel disease or blood clots -breast, cervical, or vaginal cancer -diabetes -heart disease -kidney disease -liver disease -mental depression -migraine -seizures -stroke -vaginal bleeding -an unusual or allergic reaction to norethindrone, other medicines, foods, dyes, or preservatives -pregnant or trying to get pregnant -breast-feeding How should I use this medicine? Take this medicine by mouth with a glass of water. You may take it with or without food. Follow the directions on the prescription label. Take this medicine at the same time each day and in the order directed on the package. Do not take your medicine more often than directed. Contact your pediatrician regarding the use of this medicine in children. Special care may be needed. This medicine has been used in female children who have started having menstrual periods. A patient package insert for the product will be given with each prescription and refill. Read this sheet carefully each time. The sheet may change frequently. Overdosage: If you think you have taken too much of this medicine contact a poison control center or emergency room at once. NOTE: This medicine is only for you. Do not share this medicine with others. What if I miss a dose? Try not to miss a dose. Every time you miss a dose or take a dose late your chance of  pregnancy increases. When 1 pill is missed (even if only 3 hours late), take the missed pill as soon as possible and continue taking a pill each day at the regular time (use a back up method of birth control for the next 48 hours). If more than 1 dose is missed, use an additional birth control method for the rest of your pill pack until menses occurs. Contact your health care professional if more than 1 dose has been missed. What may interact with this medicine? Do not take this medicine with any of the following medications: -amprenavir or fosamprenavir -bosentan This medicine may also interact with the following medications: -antibiotics or medicines for infections, especially rifampin, rifabutin, rifapentine, and griseofulvin, and possibly penicillins or tetracyclines -aprepitant -barbiturate medicines, such as phenobarbital -carbamazepine -felbamate -modafinil -oxcarbazepine -phenytoin -ritonavir or other medicines for HIV infection or AIDS -St. John's wort -topiramate This list may not describe all possible interactions. Give your health care provider a list of all the medicines, herbs, non-prescription drugs, or dietary supplements you use. Also tell them if you smoke, drink alcohol, or use illegal drugs. Some items may interact with your medicine. What should I watch for while using this medicine? Visit your doctor or health care professional for regular checks on your progress. You will need a regular breast and pelvic exam and Pap smear while on this medicine. Use an additional method of birth control during the first cycle that you take these tablets. If you have any reason to think you are pregnant, stop taking this medicine right away and contact your doctor or health care professional. If you are taking this medicine for hormone related problems, it   may take several cycles of use to see improvement in your condition. This medicine does not protect you against HIV infection (AIDS)  or any other sexually transmitted diseases. What side effects may I notice from receiving this medicine? Side effects that you should report to your doctor or health care professional as soon as possible: -breast tenderness or discharge -pain in the abdomen, chest, groin or leg -severe headache -skin rash, itching, or hives -sudden shortness of breath -unusually weak or tired -vision or speech problems -yellowing of skin or eyes Side effects that usually do not require medical attention (report to your doctor or health care professional if they continue or are bothersome): -changes in sexual desire -change in menstrual flow -facial hair growth -fluid retention and swelling -headache -irritability -nausea -weight gain or loss This list may not describe all possible side effects. Call your doctor for medical advice about side effects. You may report side effects to FDA at 1-800-FDA-1088. Where should I keep my medicine? Keep out of the reach of children. Store at room temperature between 15 and 30 degrees C (59 and 86 degrees F). Throw away any unused medicine after the expiration date. NOTE: This sheet is a summary. It may not cover all possible information. If you have questions about this medicine, talk to your doctor, pharmacist, or health care provider.  2015, Elsevier/Gold Standard. (2012-06-13 16:41:35)  

## 2015-05-12 ENCOUNTER — Telehealth: Payer: Self-pay | Admitting: Obstetrics and Gynecology

## 2015-05-12 LAB — HEPATITIS B SURFACE ANTIGEN: Hepatitis B Surface Ag: NEGATIVE

## 2015-05-12 LAB — GC/CHLAMYDIA PROBE AMP
Chlamydia trachomatis, NAA: NEGATIVE
Neisseria gonorrhoeae by PCR: NEGATIVE

## 2015-05-12 LAB — HEPATITIS C ANTIBODY

## 2015-05-12 LAB — HIV ANTIBODY (ROUTINE TESTING W REFLEX): HIV Screen 4th Generation wRfx: NONREACTIVE

## 2015-05-12 LAB — RPR: RPR Ser Ql: NONREACTIVE

## 2015-05-12 LAB — HSV 2 ANTIBODY, IGG: HSV 2 Glycoprotein G Ab, IgG: 8.08 index — ABNORMAL HIGH (ref 0.00–0.90)

## 2015-05-12 NOTE — Telephone Encounter (Signed)
Pt informed Labs results RPR and HIV negative from 05/11/15, all other labs from this date still pending. Pt states will call office back on Monday, Aug. 05/2015.

## 2015-05-17 ENCOUNTER — Telehealth: Payer: Self-pay | Admitting: *Deleted

## 2015-05-17 NOTE — Telephone Encounter (Signed)
Pt informed of negative GC/CHL, RPR, HIV, HEP C, HEP B, and Positive HSV 2 results. Pt verbalized understanding.

## 2015-05-27 ENCOUNTER — Ambulatory Visit (INDEPENDENT_AMBULATORY_CARE_PROVIDER_SITE_OTHER): Payer: Medicaid Other | Admitting: Adult Health

## 2015-05-27 ENCOUNTER — Encounter: Payer: Self-pay | Admitting: Adult Health

## 2015-05-27 VITALS — BP 148/82 | HR 84 | Ht 68.0 in | Wt 194.0 lb

## 2015-05-27 DIAGNOSIS — B379 Candidiasis, unspecified: Secondary | ICD-10-CM | POA: Diagnosis not present

## 2015-05-27 DIAGNOSIS — N898 Other specified noninflammatory disorders of vagina: Secondary | ICD-10-CM

## 2015-05-27 LAB — POCT WET PREP (WET MOUNT): WBC WET PREP: POSITIVE

## 2015-05-27 MED ORDER — FLUCONAZOLE 150 MG PO TABS: 150.0000 mg | ORAL_TABLET | Freq: Once | ORAL | Status: DC

## 2015-05-27 NOTE — Progress Notes (Signed)
Subjective:     Patient ID: Miranda [name], female   DOB: Feb 18, 1981, 34 y.o.   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  Review of Systems Patient denies any headaches, hearing loss, fatigue, blurred vision, shortness of breath, chest pain, problems with bowel movements, urination, or intercourse. No joint pain or mood swings Reviewed past medical,surgical, social and family history. Reviewed medications and allergies.     Objective:   Physical Exam BP 148/82 mmHg  Pulse 84  Ht  (1.727 m)  Wt 194 lb (87.998 kg)  BMI 29.50 kg/m2  LMP 02/21/2015 (Approximate)  Breastfeeding? No Skin warm and dry.Pelvic: external genitalia is normal in appearance no lesions, vagina: white discharge without odor,urethra has no lesions or masses noted, cervix:smooth and bulbous, uterus: normal size, shape and contour, non tender, no masses felt, adnexa: no masses or tenderness noted. Bladder is non tender and no masses felt. Wet prep: + for yeast buds and +WBCs. GC/CHL obtained.     Assessment:     Vaginal discharge Yeast infection    Plan:    Use condoms rx diflucan 150 mg #1 take 1 now and 1 refill GC/CHL sent Follow up prn

## 2015-05-27 NOTE — Patient Instructions (Signed)

## 2015-05-29 LAB — GC/CHLAMYDIA PROBE AMP
Chlamydia trachomatis, NAA: NEGATIVE
NEISSERIA GONORRHOEAE BY PCR: NEGATIVE

## 2015-05-30 ENCOUNTER — Telehealth: Payer: Self-pay | Admitting: Adult Health

## 2015-05-30 NOTE — Telephone Encounter (Signed)
Spoke with pt letting her know GC/CHL was negative. JSY 

## 2015-07-26 ENCOUNTER — Emergency Department (HOSPITAL_COMMUNITY)
Admission: EM | Admit: 2015-07-26 | Discharge: 2015-07-26 | Disposition: A | Discharge status: Home / Self Care | Payer: Medicaid Other | Attending: Emergency Medicine | Admitting: Emergency Medicine

## 2015-07-26 ENCOUNTER — Encounter (HOSPITAL_COMMUNITY): Payer: Self-pay | Admitting: *Deleted

## 2015-07-26 ENCOUNTER — Emergency Department (HOSPITAL_COMMUNITY): Payer: Medicaid Other

## 2015-07-26 DIAGNOSIS — Z3202 Encounter for pregnancy test, result negative: Secondary | ICD-10-CM | POA: Diagnosis not present

## 2015-07-26 DIAGNOSIS — Z8679 Personal history of other diseases of the circulatory system: Secondary | ICD-10-CM | POA: Insufficient documentation

## 2015-07-26 DIAGNOSIS — E079 Disorder of thyroid, unspecified: Secondary | ICD-10-CM | POA: Insufficient documentation

## 2015-07-26 DIAGNOSIS — N898 Other specified noninflammatory disorders of vagina: Secondary | ICD-10-CM | POA: Diagnosis not present

## 2015-07-26 DIAGNOSIS — M545 Low back pain: Secondary | ICD-10-CM | POA: Insufficient documentation

## 2015-07-26 DIAGNOSIS — Z8619 Personal history of other infectious and parasitic diseases: Secondary | ICD-10-CM | POA: Diagnosis not present

## 2015-07-26 DIAGNOSIS — K5900 Constipation, unspecified: Secondary | ICD-10-CM | POA: Insufficient documentation

## 2015-07-26 DIAGNOSIS — R1030 Lower abdominal pain, unspecified: Secondary | ICD-10-CM | POA: Insufficient documentation

## 2015-07-26 DIAGNOSIS — Z8659 Personal history of other mental and behavioral disorders: Secondary | ICD-10-CM | POA: Insufficient documentation

## 2015-07-26 DIAGNOSIS — E119 Type 2 diabetes mellitus without complications: Secondary | ICD-10-CM | POA: Diagnosis not present

## 2015-07-26 DIAGNOSIS — Z79899 Other long term (current) drug therapy: Secondary | ICD-10-CM | POA: Diagnosis not present

## 2015-07-26 DIAGNOSIS — N72 Inflammatory disease of cervix uteri: Secondary | ICD-10-CM

## 2015-07-26 DIAGNOSIS — R109 Unspecified abdominal pain: Secondary | ICD-10-CM

## 2015-07-26 DIAGNOSIS — Z720 Tobacco use: Secondary | ICD-10-CM | POA: Insufficient documentation

## 2015-07-26 DIAGNOSIS — Z7984 Long term (current) use of oral hypoglycemic drugs: Secondary | ICD-10-CM | POA: Insufficient documentation

## 2015-07-26 DIAGNOSIS — R1031 Right lower quadrant pain: Secondary | ICD-10-CM | POA: Diagnosis present

## 2015-07-26 LAB — WET PREP, GENITAL
CLUE CELLS WET PREP: NONE SEEN
TRICH WET PREP: NONE SEEN
YEAST WET PREP: NONE SEEN

## 2015-07-26 LAB — COMPREHENSIVE METABOLIC PANEL
ALBUMIN: 3.9 g/dL (ref 3.5–5.0)
ALK PHOS: 54 U/L (ref 38–126)
ALT: 13 U/L — AB (ref 14–54)
AST: 16 U/L (ref 15–41)
Anion gap: 8 (ref 5–15)
BUN: 17 mg/dL (ref 6–20)
CALCIUM: 9.1 mg/dL (ref 8.9–10.3)
CO2: 24 mmol/L (ref 22–32)
CREATININE: 0.66 mg/dL (ref 0.44–1.00)
Chloride: 106 mmol/L (ref 101–111)
GFR calc non Af Amer: >60 mL/min (ref >60)
GLUCOSE: 75 mg/dL (ref 65–99)
Potassium: 3.7 mmol/L (ref 3.5–5.1)
SODIUM: 138 mmol/L (ref 135–145)
Total Bilirubin: 0.4 mg/dL (ref 0.3–1.2)
Total Protein: 7.2 g/dL (ref 6.5–8.1)

## 2015-07-26 LAB — URINALYSIS, ROUTINE W REFLEX MICROSCOPIC
BILIRUBIN URINE: NEGATIVE
Glucose, UA: NEGATIVE mg/dL
Hgb urine dipstick: NEGATIVE
Ketones, ur: NEGATIVE mg/dL
LEUKOCYTES UA: NEGATIVE
NITRITE: NEGATIVE
Protein, ur: NEGATIVE mg/dL
UROBILINOGEN UA: 0.2 mg/dL (ref 0.0–1.0)
pH: 5.5 (ref 5.0–8.0)

## 2015-07-26 LAB — CBC WITH DIFFERENTIAL/PLATELET
Basophils Absolute: 0 10*3/uL (ref 0.0–0.1)
Basophils Relative: 0 %
EOS ABS: 0.1 10*3/uL (ref 0.0–0.7)
Eosinophils Relative: 1 %
HEMATOCRIT: 36.2 % (ref 36.0–46.0)
HEMOGLOBIN: 12.3 g/dL (ref 12.0–15.0)
LYMPHS ABS: 3.1 10*3/uL (ref 0.7–4.0)
Lymphocytes Relative: 28 %
MCH: 30.3 pg (ref 26.0–34.0)
MCHC: 34 g/dL (ref 30.0–36.0)
MCV: 89.2 fL (ref 78.0–100.0)
Monocytes Absolute: 1 10*3/uL (ref 0.1–1.0)
Monocytes Relative: 9 %
NEUTROS ABS: 7.1 10*3/uL (ref 1.7–7.7)
NEUTROS PCT: 62 %
Platelets: 272 10*3/uL (ref 150–400)
RBC: 4.06 MIL/uL (ref 3.87–5.11)
RDW: 14 % (ref 11.5–15.5)
WBC: 11.4 10*3/uL — AB (ref 4.0–10.5)

## 2015-07-26 LAB — LIPASE, BLOOD: Lipase: 25 U/L (ref 22–51)

## 2015-07-26 LAB — PREGNANCY, URINE: Preg Test, Ur: NEGATIVE

## 2015-07-26 MED ORDER — CEFTRIAXONE SODIUM 250 MG IJ SOLR
250.0000 mg | Freq: Once | INTRAMUSCULAR | Status: AC
  Administered 2015-07-26: 250 mg via INTRAMUSCULAR
  Filled 2015-07-26: qty 250

## 2015-07-26 MED ORDER — ONDANSETRON HCL 4 MG PO TABS: 4.0000 mg | ORAL_TABLET | Freq: Four times a day (QID) | ORAL | Status: DC

## 2015-07-26 MED ORDER — IOHEXOL 300 MG/ML  SOLN
25.0000 mL | Freq: Once | INTRAMUSCULAR | Status: AC | PRN
  Administered 2015-07-26: 25 mL via ORAL

## 2015-07-26 MED ORDER — AZITHROMYCIN 250 MG PO TABS
1000.0000 mg | ORAL_TABLET | Freq: Once | ORAL | Status: AC
  Administered 2015-07-26: 1000 mg via ORAL
  Filled 2015-07-26: qty 4

## 2015-07-26 MED ORDER — IOHEXOL 300 MG/ML  SOLN
100.0000 mL | Freq: Once | INTRAMUSCULAR | Status: AC | PRN
  Administered 2015-07-26: 100 mL via INTRAVENOUS

## 2015-07-26 NOTE — ED Notes (Signed)
Generalized abd pain for a week but worse for past two days, denies N/V/D, took laxatives the other day and LBM yesterday

## 2015-07-26 NOTE — Discharge Instructions (Signed)

## 2015-07-26 NOTE — ED Provider Notes (Signed)
CSN: [id number]     Arrival date & time 07/26/15  1241 History   None    Chief Complaint  Patient presents with  . Abdominal Pain     (Consider location/radiation/quality/duration/timing/severity/associated sxs/prior Treatment) HPI Comments: Worsening abdominal pain for the past week that is constant. Worse on the right side. Denies any nausea, vomiting or diarrhea. Denies any fever. Has been using laxatives intermittently but states she has not been constipated. Denies fever. Denies any urinary or vaginal symptoms. Last menstrual cycle last week. No chest pain or shortness of breath. She has pain on her right lower abdomen and right low back that radiates up her side. Nothing makes it better or worse.  The history is provided by the patient.    Past Medical History  Diagnosis Date  . Thyroid disease   . Pregnant   . Heart valve problem   . Palpitation   . Chest pain 07/24/2012    2D Echo EF 60%-65% which shoewd a bicuspid aortic vavle with mild stenosis  . PVC (premature ventricular contraction) 07/24/2012    stress test which was normal with good excercise  . Diabetes mellitus     type 2 on insulin  . Herpes   . Mental disorder     depression  . Vaginal discharge 04/14/2014  . Irregular periods 04/14/2014  . Yeast infection 05/27/2015   Past Surgical History  Procedure Laterality Date  . No past surgeries     Family History  Problem Relation Age of Onset  . Hypertension Mother   . Diabetes Brother   . Diabetes Maternal Grandmother   . Congestive Heart Failure Maternal Grandmother    Social History  Substance Use Topics  . Smoking status: Current Every Day Smoker -- 0.25 packs/day for 5 years    Types: Cigarettes    Last Attempt to Quit: 05/20/2012  . Smokeless tobacco: Never Used  . Alcohol Use: 0.0 oz/week    0 Standard drinks or equivalent per week     Comment: occ. use   OB History    Gravida Para Term Preterm AB TAB SAB Ectopic Multiple Living   Review of Systems  Constitutional: Positive for activity change and appetite change. Negative for fever.  Respiratory: Negative for cough, chest tightness and shortness of breath.   Gastrointestinal: Positive for abdominal pain and constipation. Negative for nausea, vomiting and diarrhea.  Genitourinary: Negative for dysuria, hematuria, vaginal bleeding and vaginal discharge.  Musculoskeletal: Positive for back pain. Negative for myalgias and arthralgias.  Skin: Negative for rash.  Neurological: Negative for dizziness, weakness and headaches.  A complete 10 system review of systems was obtained and all systems are negative except as noted in the HPI and PMH.      Allergies  Keflex  Home Medications   Prior to Admission medications   Medication Sig Start Date End Date Taking? Authorizing Provider  acyclovir (ZOVIRAX) 400 MG tablet take 1 tablet by mouth three times a day Patient taking differently: Take one tablet three times daily as needed. 03/31/15  Yes Lazaro Arms, MD  aspirin-acetaminophen-caffeine (EXCEDRIN MIGRAINE) [phone] MG tablet Take 2 tablets by mouth every 6 (six) hours as needed for headache.   Yes Historical Provider, MD  levothyroxine (SYNTHROID, LEVOTHROID) 125 MCG tablet Take 125 mcg by mouth daily before breakfast.   Yes Historical Provider, MD  metFORMIN (GLUCOPHAGE-XR) 500 MG 24 hr tablet Take 1,000 mg  by mouth daily with breakfast.   Yes Historical Provider, MD  fluconazole (DIFLUCAN) 150 MG tablet Take 1 tablet (150 mg total) by mouth once. Patient not taking: Reported on 07/26/2015 05/27/15   Adline Potter, NP  ondansetron (ZOFRAN) 4 MG tablet Take 1 tablet (4 mg total) by mouth every 6 (six) hours. 07/26/15   Glynn Octave, MD  oxybutynin (DITROPAN) 5 MG tablet Take 1 tablet (5 mg total) by mouth 2 (two) times daily. Patient not taking: Reported on 07/26/2015 02/03/15   Tilda Burrow, MD   BP 126/89 mmHg  Pulse 74  Temp(Src) 98.1 F (36.7  C) (Oral)  Resp 18  Ht  (1.727 m)  Wt 192 lb (87.091 kg)  BMI 29.20 kg/m2  SpO2 98%  LMP 06/23/2015 (Approximate) Physical Exam  Constitutional: She is oriented to person, place, and time. She appears well-developed and well-nourished. No distress.  HENT:  Head: Normocephalic and atraumatic.  Mouth/Throat: Oropharynx is clear and moist. No oropharyngeal exudate.  Eyes: Conjunctivae and EOM are normal. Pupils are equal, round, and reactive to light.  Neck: Normal range of motion. Neck supple.  No meningismus.  Cardiovascular: Normal rate, regular rhythm, normal heart sounds and intact distal pulses.   No murmur heard. Pulmonary/Chest: Effort normal and breath sounds normal. No respiratory distress.  Abdominal: Soft. There is tenderness. There is no rebound and no guarding.  Diffuse lower abdominal tenderness, worse on the right side. No guarding or rebound.  Genitourinary: Vaginal discharge found.  Chaperone present. Normal external genitalia. White discharge in vaginal vault. No CMT. No lateralizing adnexal tenderness.  Musculoskeletal: Normal range of motion. She exhibits no edema or tenderness.  R paraspinal tenderness  Neurological: She is alert and oriented to person, place, and time. No cranial nerve deficit. She exhibits normal muscle tone. Coordination normal.  No ataxia on finger to nose bilaterally. No pronator drift. 5/5 strength throughout. CN 2-12 intact. Negative Romberg. Equal grip strength. Sensation intact. Gait is normal.   Skin: Skin is warm.  Psychiatric: She has a normal mood and affect. Her behavior is normal.  Nursing note and vitals reviewed.   ED Course  Procedures (including critical care time) Labs Review Labs Reviewed  WET PREP, GENITAL - Abnormal; Notable for the following:    WBC, Wet Prep HPF POC MANY (*)    All other components within normal limits  URINALYSIS, ROUTINE W REFLEX MICROSCOPIC (NOT AT [facility]) - Abnormal; Notable for the following:     Specific Gravity, Urine >1.030 (*)    All other components within normal limits  CBC WITH DIFFERENTIAL/PLATELET - Abnormal; Notable for the following:    WBC 11.4 (*)    All other components within normal limits  COMPREHENSIVE METABOLIC PANEL - Abnormal; Notable for the following:    ALT 13 (*)    All other components within normal limits  PREGNANCY, URINE  LIPASE, BLOOD  GC/CHLAMYDIA PROBE AMP ([facility]) NOT AT [facility]    Imaging Review Ct Abdomen Pelvis W Contrast  07/26/2015  CLINICAL DATA:  Acute right lower quadrant abdominal pain. EXAM: CT ABDOMEN AND PELVIS WITH CONTRAST TECHNIQUE: Multidetector CT imaging of the abdomen and pelvis was performed using the standard protocol following bolus administration of intravenous contrast. CONTRAST:  25mL OMNIPAQUE IOHEXOL 300 MG/ML SOLN, OMNIPAQUE IOHEXOL 300 MG/ML SOLN COMPARISON:  None available currently. FINDINGS: Visualized lung bases appear normal. No significant osseous abnormality is noted. No gallstones are noted. The liver, spleen and pancreas appear normal. Adrenal  glands and right kidney appear normal. 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 No hydronephrosis or renal obstruction is noted. The appendix appears normal. There is no evidence of bowel obstruction. No abnormal fluid collection is noted. Uterus and ovaries appear normal. Urinary bladder appears normal. No significant adenopathy is noted. 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 No other abnormality seen in the abdomen or pelvis. Electronically Signed   By: Lupita RaiderJames  Green Jr, M.D.   On: 07/26/2015 15:45   I have personally reviewed and evaluated these images and lab results as part of my medical decision-making.   EKG Interpretation None      MDM   Final diagnoses:  Abdominal pain, unspecified abdominal location  Cervicitis   worsening right lower quadrant abdominal pain. No fever or vomiting. No peritoneal  signs.  Urinalysis negative. HCG negative. WBC 11.  CT negative for appendicitis or other acute pathology. Ongoing pain x 1 week. Doubt ovarian torsion.  Empiric treatment for PID/cervicitis.  Doubt TOA.  Follow up with PCP and GYN. Return precautions discussed.    Glynn OctaveStephen Ilianna Bown, MD 07/26/15 [phone]

## 2015-07-27 ENCOUNTER — Encounter (HOSPITAL_COMMUNITY): Payer: Self-pay | Admitting: Family Medicine

## 2015-07-27 ENCOUNTER — Ambulatory Visit: Payer: Medicaid Other | Admitting: Obstetrics & Gynecology

## 2015-07-27 ENCOUNTER — Emergency Department (HOSPITAL_COMMUNITY)
Admission: EM | Admit: 2015-07-27 | Discharge: 2015-07-27 | Discharge status: Against Medical Advice | Payer: Medicaid Other | Attending: Dermatology | Admitting: Dermatology

## 2015-07-27 DIAGNOSIS — Z720 Tobacco use: Secondary | ICD-10-CM | POA: Insufficient documentation

## 2015-07-27 DIAGNOSIS — E119 Type 2 diabetes mellitus without complications: Secondary | ICD-10-CM | POA: Insufficient documentation

## 2015-07-27 DIAGNOSIS — R1110 Vomiting, unspecified: Secondary | ICD-10-CM | POA: Diagnosis not present

## 2015-07-27 DIAGNOSIS — R1084 Generalized abdominal pain: Secondary | ICD-10-CM | POA: Insufficient documentation

## 2015-07-27 LAB — GC/CHLAMYDIA PROBE AMP (~~LOC~~) NOT AT ARMC
CHLAMYDIA, DNA PROBE: NEGATIVE
NEISSERIA GONORRHEA: NEGATIVE

## 2015-07-27 NOTE — ED Notes (Signed)
Attempted to call x 1 with no answer.

## 2015-07-27 NOTE — ED Notes (Signed)
Attempted to call for room placement x 3 with no response.

## 2015-07-27 NOTE — ED Notes (Signed)
Pt here for generalized  abd pain x 1 week. sts was seen last night and not better. sts vomiting.

## 2015-07-27 NOTE — ED Notes (Signed)
Attempted to call x 2. No response.

## 2015-07-29 ENCOUNTER — Ambulatory Visit: Payer: Medicaid Other | Admitting: Obstetrics & Gynecology

## 2015-08-02 ENCOUNTER — Emergency Department
Admission: EM | Admit: 2015-08-02 | Discharge: 2015-08-02 | Disposition: A | Discharge status: Home / Self Care | Payer: Medicaid Other | Attending: Emergency Medicine | Admitting: Emergency Medicine

## 2015-08-02 ENCOUNTER — Encounter: Payer: Self-pay | Admitting: Emergency Medicine

## 2015-08-02 DIAGNOSIS — F1020 Alcohol dependence, uncomplicated: Secondary | ICD-10-CM | POA: Diagnosis present

## 2015-08-02 DIAGNOSIS — Z720 Tobacco use: Secondary | ICD-10-CM | POA: Insufficient documentation

## 2015-08-02 DIAGNOSIS — Z79899 Other long term (current) drug therapy: Secondary | ICD-10-CM | POA: Insufficient documentation

## 2015-08-02 DIAGNOSIS — E119 Type 2 diabetes mellitus without complications: Secondary | ICD-10-CM | POA: Diagnosis not present

## 2015-08-02 DIAGNOSIS — F419 Anxiety disorder, unspecified: Secondary | ICD-10-CM | POA: Insufficient documentation

## 2015-08-02 DIAGNOSIS — Z7982 Long term (current) use of aspirin: Secondary | ICD-10-CM | POA: Insufficient documentation

## 2015-08-02 LAB — GLUCOSE, CAPILLARY: GLUCOSE-CAPILLARY: 103 mg/dL — AB (ref 65–99)

## 2015-08-02 NOTE — ED Notes (Signed)
Pt reported to ED for residential drug clearance for RTS. Pt states she is diabetic and RTS needs a clearance on blood sugar. Pt is detox from alcohol. Last known time of ETOH use was saturday. Pt sates she is not suicidal and she does not feel she is a harm to her self or others.

## 2015-08-02 NOTE — ED Provider Notes (Signed)
[facility] Emergency Department Provider Note  ____________________________________________  Time seen: 3:10 PM  I have reviewed the triage vital signs and the nursing notes.   HISTORY  Chief Complaint Drug / Alcohol Assessment    HPI Miranda [name] is a 34 y.o. female who reports that she is being started on a detox program at [facility] and needs medical clearance due to the fact that she has diabetes. She reports that she takes metformin and has been compliant with this. She also takes Synthroid for hypothyroidism and has been compliant with this. Her only complaint right now is feeling a little bit anxious. She is tolerating oral intake and has no chest pain shortness of breath belly pain nausea vomiting or diarrhea. Smokes but does not use any other drugs. Last alcohol use was 2 days ago. No history of hallucinations or seizures     Past Medical History  Diagnosis Date  . Thyroid disease   . Pregnant   . Heart valve problem   . Palpitation   . Chest pain 07/24/2012    2D Echo EF 60%-65% which shoewd a bicuspid aortic vavle with mild stenosis  . PVC (premature ventricular contraction) 07/24/2012    stress test which was normal with good excercise  . Diabetes mellitus     type 2 on insulin  . Herpes   . Mental disorder     depression  . Vaginal discharge 04/14/2014  . Irregular periods 04/14/2014  . Yeast infection 05/27/2015     Patient Active Problem List   Diagnosis Date Noted  . Yeast infection 05/27/2015  . Vaginal discharge 04/14/2014  . Irregular periods 04/14/2014  . Smoker 10/21/2013  . Depression 10/21/2013  . Palpitations 03/03/2013  . Type II diabetes mellitus (HCC) 02/04/2013  . Hypothyroidism 01/19/2013     Past Surgical History  Procedure Laterality Date  . No past surgeries       Current Outpatient Rx  Name  Route  Sig  Dispense  Refill  . acyclovir (ZOVIRAX) 400 MG tablet      take 1 tablet by mouth three times a  day Patient taking differently: Take one tablet three times daily as needed.   90 tablet   3   . aspirin-acetaminophen-caffeine (EXCEDRIN MIGRAINE) 250-250-65 MG tablet   Oral   Take 2 tablets by mouth every 6 (six) hours as needed for headache.         . fluconazole (DIFLUCAN) 150 MG tablet   Oral   Take 1 tablet (150 mg total) by mouth once. Patient not taking: Reported on 07/26/2015   1 tablet   1   . levothyroxine (SYNTHROID, LEVOTHROID) 125 MCG tablet   Oral   Take 125 mcg by mouth daily before breakfast.         . metFORMIN (GLUCOPHAGE-XR) 500 MG 24 hr tablet   Oral   Take 1,000 mg by mouth daily with breakfast.         . ondansetron (ZOFRAN) 4 MG tablet   Oral   Take 1 tablet (4 mg total) by mouth every 6 (six) hours.   12 tablet   0   . oxybutynin (DITROPAN) 5 MG tablet   Oral   Take 1 tablet (5 mg total) by mouth 2 (two) times daily. Patient not taking: Reported on 07/26/2015   60 tablet   3      Allergies Keflex   Family History  Problem Relation Age of Onset  . Hypertension Mother   .  Diabetes Brother   . Diabetes Maternal Grandmother   . Congestive Heart Failure Maternal Grandmother     Social History Social History  Substance Use Topics  . Smoking status: Current Every Day Smoker -- 0.25 packs/day for 5 years    Types: Cigarettes    Last Attempt to Quit: 05/20/2012  . Smokeless tobacco: Never Used  . Alcohol Use: 0.0 oz/week    0 Standard drinks or equivalent per week     Comment: occ. use    Review of Systems  Constitutional:   No fever or chills. No weight changes Eyes:   No blurry vision or double vision.  ENT:   No sore throat. Cardiovascular:   No chest pain. Respiratory:   No dyspnea or cough. Gastrointestinal:   Negative for abdominal pain, vomiting and diarrhea.  No BRBPR or melena. Genitourinary:   Negative for dysuria, urinary retention, bloody urine, or difficulty urinating. Musculoskeletal:   Negative for back  pain. No joint swelling or pain. Skin:   Negative for rash. Neurological:   Negative for headaches, focal weakness or numbness. Psychiatric:  Positive anxiety, no depression, SI HI or hallucinations   Endocrine:  No hot/cold intolerance, changes in energy, or sleep difficulty.  10-point ROS otherwise negative.  ____________________________________________   PHYSICAL EXAM:  VITAL SIGNS: ED Triage Vitals  Enc Vitals Group     BP 08/02/15 1440 129/76 mmHg     Pulse Rate 08/02/15 1440 85     Resp 08/02/15 1440 16     Temp 08/02/15 1440 98.5 F (36.9 C)     Temp Source 08/02/15 1440 Oral     SpO2 08/02/15 1440 100 %     Weight 08/02/15 1440 192 lb (87.091 kg)     Height 08/02/15 1440  (1.727 m)     Head Cir --      Peak Flow --      Pain Score --      Pain Loc --      Pain Edu? --      Excl. in GC? --      Constitutional:   Alert and oriented. Well appearing and in no distress. Eyes:   No scleral icterus. No conjunctival pallor. PERRL. EOMI ENT   Head:   Normocephalic and atraumatic.   Nose:   No congestion/rhinnorhea. No septal hematoma   Mouth/Throat:   MMM, no pharyngeal erythema. No peritonsillar mass. No uvula shift.   Neck:   No stridor. No SubQ emphysema. No meningismus. Hematological/Lymphatic/Immunilogical:   No cervical lymphadenopathy. Cardiovascular:   RRR. Normal and symmetric distal pulses are present in all extremities. No murmurs, rubs, or gallops. Respiratory:   Normal respiratory effort without tachypnea nor retractions. Breath sounds are clear and equal bilaterally. No wheezes/rales/rhonchi. Gastrointestinal:   Soft and nontender. No distention. There is no CVA tenderness.  No rebound, rigidity, or guarding. Genitourinary:   deferred Musculoskeletal:   Nontender with normal range of motion in all extremities. No joint effusions.  No lower extremity tenderness.  No edema. Neurologic:   Normal speech and language.  CN 2-10 normal. Motor  grossly intact. No pronator drift.  Normal gait. No tremor No gross focal neurologic deficits are appreciated.  Skin:    Skin is warm, dry and intact. No rash noted.  No petechiae, purpura, or bullae. Psychiatric:   Mood and affect are normal. Speech and behavior are normal. Patient exhibits appropriate insight and judgment.  ____________________________________________    LABS (pertinent positives/negatives) (all labs ordered  are listed, but only abnormal results are displayed) Labs Reviewed  CBG MONITORING, ED   ____________________________________________   EKG    ____________________________________________    RADIOLOGY    ____________________________________________   PROCEDURES   ____________________________________________   INITIAL IMPRESSION / ASSESSMENT AND PLAN / ED COURSE  Pertinent labs & imaging results that were available during my care of the patient were reviewed by me and considered in my medical decision making (see chart for details).  Patient well appearing, calm and comfortable, normal vital signs, no acute complaints except for feeling anxious. No evidence of neurologic dysfunction or autonomic dysfunction in the setting of alcohol abstinence. We'll check her blood sugar and at that point barring any severe abnormality she should be medically clear for follow-up with [facility] for detox and rehabilitation programs.     ____________________________________________   FINAL CLINICAL IMPRESSION(S) / ED DIAGNOSES  Final diagnoses:  Uncomplicated alcohol dependence (HCC)      Sharman Cheek, MD 08/02/15 1553

## 2015-08-02 NOTE — ED Notes (Signed)

## 2015-08-02 NOTE — ED Notes (Signed)
1/1 bags of belongings returned to her and she verbalized that she received back all belongings that she came here with -  Discharge instructions reviewed with her and she verbalized agreement and understanding  - pt to report to [facility] for continued detox treatment  Street address and phone number provided

## 2015-08-02 NOTE — Discharge Instructions (Signed)
Continue taking all your home medications for your chronic illnesses and follow-up with [facility] for treatment of your alcohol dependence..  Alcohol Use Disorder Alcohol use disorder is a mental disorder. It is not a one-time incident of heavy drinking. Alcohol use disorder is the excessive and uncontrollable use of alcohol over time that leads to problems with functioning in one or more areas of daily living. People with this disorder risk harming themselves and others when they drink to excess. Alcohol use disorder also can cause other mental disorders, such as mood and anxiety disorders, and serious physical problems. People with alcohol use disorder often misuse other drugs.  Alcohol use disorder is common and widespread. Some people with this disorder drink alcohol to cope with or escape from negative life events. Others drink to relieve chronic pain or symptoms of mental illness. People with a family history of alcohol use disorder are at higher risk of losing control and using alcohol to excess.  Drinking too much alcohol can cause injury, accidents, and health problems. One drink can be too much when you are:  Working.  Pregnant or breastfeeding.  Taking medicines. Ask your doctor.  Driving or planning to drive. SYMPTOMS  Signs and symptoms of alcohol use disorder may include the following:   Consumption ofalcohol inlarger amounts or over a longer period of time than intended.  Multiple unsuccessful attempts to cutdown or control alcohol use.   A great deal of time spent obtaining alcohol, using alcohol, or recovering from the effects of alcohol (hangover).  A strong desire or urge to use alcohol (cravings).   Continued use of alcohol despite problems at work, school, or home because of alcohol use.   Continued use of alcohol despite problems in relationships because of alcohol use.  Continued use of alcohol in situations when it is physically hazardous, such as driving a  car.  Continued use of alcohol despite awareness of a physical or psychological problem that is likely related to alcohol use. Physical problems related to alcohol use can involve the brain, heart, liver, stomach, and intestines. Psychological problems related to alcohol use include intoxication, depression, anxiety, psychosis, delirium, and dementia.   The need for increased amounts of alcohol to achieve the same desired effect, or a decreased effect from the consumption of the same amount of alcohol (tolerance).  Withdrawal symptoms upon reducing or stopping alcohol use, or alcohol use to reduce or avoid withdrawal symptoms. Withdrawal symptoms include:  Racing heart.  Hand tremor.  Difficulty sleeping.  Nausea.  Vomiting.  Hallucinations.  Restlessness.  Seizures. DIAGNOSIS Alcohol use disorder is diagnosed through an assessment by your health care provider. Your health care provider may start by asking three or four questions to screen for excessive or problematic alcohol use. To confirm a diagnosis of alcohol use disorder, at least two symptoms must be present within a 4844-month period. The severity of alcohol use disorder depends on the number of symptoms:  Mild--two or three.  Moderate--four or five.  Severe--six or more. Your health care provider may perform a physical exam or use results from lab tests to see if you have physical problems resulting from alcohol use. Your health care provider may refer you to a mental health professional for evaluation. TREATMENT  Some people with alcohol use disorder are able to reduce their alcohol use to low-risk levels. Some people with alcohol use disorder need to quit drinking alcohol. When necessary, mental health professionals with specialized training in substance use treatment can help. Your  health care provider can help you decide how severe your alcohol use disorder is and what type of treatment you need. The following forms of  treatment are available:   Detoxification. Detoxification involves the use of prescription medicines to prevent alcohol withdrawal symptoms in the first week after quitting. This is important for people with a history of symptoms of withdrawal and for heavy drinkers who are likely to have withdrawal symptoms. Alcohol withdrawal can be dangerous and, in severe cases, cause death. Detoxification is usually provided in a hospital or in-patient substance use treatment facility.  Counseling or talk therapy. Talk therapy is provided by substance use treatment counselors. It addresses the reasons people use alcohol and ways to keep them from drinking again. The goals of talk therapy are to help people with alcohol use disorder find healthy activities and ways to cope with life stress, to identify and avoid triggers for alcohol use, and to handle cravings, which can cause relapse.  Medicines.Different medicines can help treat alcohol use disorder through the following actions:  Decrease alcohol cravings.  Decrease the positive reward response felt from alcohol use.  Produce an uncomfortable physical reaction when alcohol is used (aversion therapy).  Support groups. Support groups are run by people who have quit drinking. They provide emotional support, advice, and guidance. These forms of treatment are often combined. Some people with alcohol use disorder benefit from intensive combination treatment provided by specialized substance use treatment centers. Both inpatient and outpatient treatment programs are available.   This information is not intended to replace advice given to you by your health care provider. Make sure you discuss any questions you have with your health care provider.   Document Released: 11/01/2004 Document Revised: 10/15/2014 Document Reviewed: 01/01/2013 Elsevier Interactive Patient Education [employer]

## 2015-08-02 NOTE — ED Notes (Signed)
BEHAVIORAL HEALTH ROUNDING Patient sleeping: No. Patient alert and oriented: yes Behavior appropriate: Yes.  ; If no, describe:  Nutrition and fluids offered: yes Toileting and hygiene offered: Yes  Sitter present: q15 minute observations and security camera monitoring Law enforcement present: Yes  ODS  

## 2015-08-22 ENCOUNTER — Encounter: Payer: Self-pay | Admitting: Women's Health

## 2015-08-22 ENCOUNTER — Ambulatory Visit (INDEPENDENT_AMBULATORY_CARE_PROVIDER_SITE_OTHER): Payer: Medicaid Other | Admitting: Women's Health

## 2015-08-22 ENCOUNTER — Ambulatory Visit: Payer: Medicaid Other | Admitting: Women's Health

## 2015-08-22 VITALS — BP 136/82 | HR 60 | Wt 193.0 lb

## 2015-08-22 DIAGNOSIS — R1030 Lower abdominal pain, unspecified: Secondary | ICD-10-CM

## 2015-08-22 DIAGNOSIS — K5900 Constipation, unspecified: Secondary | ICD-10-CM

## 2015-08-22 DIAGNOSIS — N898 Other specified noninflammatory disorders of vagina: Secondary | ICD-10-CM

## 2015-08-22 LAB — POCT WET PREP (WET MOUNT): Clue Cells Wet Prep Whiff POC: POSITIVE

## 2015-08-22 MED ORDER — METRONIDAZOLE 500 MG PO TABS: 500.0000 mg | ORAL_TABLET | Freq: Two times a day (BID) | ORAL | Status: DC

## 2015-08-22 NOTE — Patient Instructions (Signed)
Constipation  Drink plenty of fluid, preferably water, throughout the day  Eat foods high in fiber such as fruits, vegetables, and grains  Exercise, such as walking, is a good way to keep your bowels regular  Drink warm fluids, especially warm prune juice, or decaf coffee  Eat a 1/2 cup of real oatmeal (not instant), 1/2 cup applesauce, and 1/2-1 cup warm prune juice every day  If needed, you may take Colace (docusate sodium) stool softener once or twice a day to help keep the stool soft. If you are pregnant, wait until you are out of your first trimester (12-14 weeks of pregnancy)  If you still are having problems with constipation, you may take Miralax once daily as needed to help keep your bowels regular.  If you are pregnant, wait until you are out of your first trimester (12-14 weeks of pregnancy)   Bacterial Vaginosis Bacterial vaginosis is a vaginal infection that occurs when the normal balance of bacteria in the vagina is disrupted. It results from an overgrowth of certain bacteria. This is the most common vaginal infection in women of childbearing age. Treatment is important to prevent complications, especially in pregnant women, as it can cause a premature delivery. CAUSES  Bacterial vaginosis is caused by an increase in harmful bacteria that are normally present in smaller amounts in the vagina. Several different kinds of bacteria can cause bacterial vaginosis. However, the reason that the condition develops is not fully understood. RISK FACTORS Certain activities or behaviors can put you at an increased risk of developing bacterial vaginosis, including:  Having a new sex partner or multiple sex partners.  Douching.  Using an intrauterine device (IUD) for contraception. Women do not get bacterial vaginosis from toilet seats, bedding, swimming pools, or contact with objects around them. SIGNS AND SYMPTOMS  Some women with bacterial vaginosis have no signs or symptoms. Common  symptoms include:  Grey vaginal discharge.  A fishlike odor with discharge, especially after sexual intercourse.  Itching or burning of the vagina and vulva.  Burning or pain with urination. DIAGNOSIS  Your health care provider will take a medical history and examine the vagina for signs of bacterial vaginosis. A sample of vaginal fluid may be taken. Your health care provider will look at this sample under a microscope to check for bacteria and abnormal cells. A vaginal pH test may also be done.  TREATMENT  Bacterial vaginosis may be treated with antibiotic medicines. These may be given in the form of a pill or a vaginal cream. A second round of antibiotics may be prescribed if the condition comes back after treatment. Because bacterial vaginosis increases your risk for sexually transmitted diseases, getting treated can help reduce your risk for chlamydia, gonorrhea, HIV, and herpes. HOME CARE INSTRUCTIONS   Only take over-the-counter or prescription medicines as directed by your health care provider.  If antibiotic medicine was prescribed, take it as directed. Make sure you finish it even if you start to feel better.  Tell all sexual partners that you have a vaginal infection. They should see their health care provider and be treated if they have problems, such as a mild rash or itching.  During treatment, it is important that you follow these instructions:  Avoid sexual activity or use condoms correctly.  Do not douche.  Avoid alcohol as directed by your health care provider.  Avoid breastfeeding as directed by your health care provider. SEEK MEDICAL CARE IF:   Your symptoms are not improving after 3  days of treatment.  You have increased discharge or pain.  You have a fever. MAKE SURE YOU:   Understand these instructions.  Will watch your condition.  Will get help right away if you are not doing well or get worse. FOR MORE INFORMATION  Centers for Disease Control and  Prevention, Division of STD Prevention: SolutionApps.co.zawww.cdc.gov/std American Sexual Health Association (ASHA): www.ashastd.org    This information is not intended to replace advice given to you by your health care provider. Make sure you discuss any questions you have with your health care provider.   Document Released: 09/24/2005 Document Revised: 10/15/2014 Document Reviewed: 05/06/2013 Elsevier Interactive Patient Education [employer] Elsevier Inc.

## 2015-08-22 NOTE — Progress Notes (Signed)
Patient ID: Miranda [name], female   DOB: 1981-01-01, 34 y.o.   MRN: [id number]   [facility]  Patient name: Miranda AlamoMary S [name] MRN [id number]  Date of birth: 1981-01-01  CC & HPI:  Miranda [name] is a 34 y.o. 41P1001 Caucasian female presenting today for report of vaginal d/c w/ odor and lower abdominal pain x 1 week. Does have constipation, not taking anything. No new sex partners but does want testing for gc/ct.   Patient's last menstrual period was 08/08/2015 (approximate).  Pertinent History Reviewed:  Medical & Surgical Hx:   Past Medical History  Diagnosis Date  . Thyroid disease   . Pregnant   . Heart valve problem   . Palpitation   . Chest pain 07/24/2012    2D Echo EF 60%-65% which shoewd a bicuspid aortic vavle with mild stenosis  . PVC (premature ventricular contraction) 07/24/2012    stress test which was normal with good excercise  . Diabetes mellitus     type 2 on insulin  . Herpes   . Mental disorder     depression  . Vaginal discharge 04/14/2014  . Irregular periods 04/14/2014  . Yeast infection 05/27/2015   Past Surgical History  Procedure Laterality Date  . No past surgeries     Medications: Reviewed & Updated - see associated section Social History: Reviewed -  reports that she has been smoking Cigarettes.  She has a 5 pack-year smoking history. She has never used smokeless tobacco.  Objective Findings:  Vitals: BP 136/82 mmHg  Pulse 60  Wt 193 lb (87.544 kg)  LMP 08/08/2015 (Approximate) Body mass index is 29.35 kg/(m^2).  Physical Examination: General appearance - alert, well appearing, and in no distress Pelvic - large amount thin white malodorous d/c Small external non-thrombosed hemorrhoid  Results for orders placed or performed in visit on 08/22/15 (from the past 24 hour(s))  POCT Wet Prep Mellody Drown([place]et [place])   Collection Time: 08/22/15  4:25 PM  Result Value Ref Range   Source Wet Prep POC vaginal    WBC, Wet Prep HPF POC few    Bacteria Wet  Prep HPF POC None None, Few, Too numerous to count   BACTERIA WET PREP MORPHOLOGY POC     Clue Cells Wet Prep HPF POC Many (A) None, Too numerous to count   Clue Cells Wet Prep Whiff POC Positive Whiff    Yeast Wet Prep HPF POC None    KOH Wet Prep POC     Trichomonas Wet Prep HPF POC none      Assessment & Plan:  A:   BV  Abdominal pain  Constipation  P:  Rx metronidazole 500mg  BID x 7d for BV, no sex or etoh while taking   Constipation relief measures given  Return for prn.  Marge DuncansBooker, Aidee Latimore Randall CNM, [facility]NP-BC 08/22/2015 4:25 PM

## 2015-08-23 LAB — GC/CHLAMYDIA PROBE AMP
Chlamydia trachomatis, NAA: NEGATIVE
NEISSERIA GONORRHOEAE BY PCR: NEGATIVE

## 2015-12-14 ENCOUNTER — Ambulatory Visit: Payer: Medicaid Other | Admitting: Advanced Practice Midwife

## 2015-12-22 ENCOUNTER — Ambulatory Visit: Payer: Medicaid Other | Admitting: Advanced Practice Midwife

## 2015-12-27 ENCOUNTER — Ambulatory Visit (INDEPENDENT_AMBULATORY_CARE_PROVIDER_SITE_OTHER): Payer: Medicaid Other | Admitting: Advanced Practice Midwife

## 2015-12-27 ENCOUNTER — Encounter: Payer: Self-pay | Admitting: Advanced Practice Midwife

## 2015-12-27 VITALS — BP 120/80 | HR 80 | Ht 68.0 in | Wt 196.0 lb

## 2015-12-27 DIAGNOSIS — N949 Unspecified condition associated with female genital organs and menstrual cycle: Secondary | ICD-10-CM | POA: Diagnosis not present

## 2015-12-27 DIAGNOSIS — Z113 Encounter for screening for infections with a predominantly sexual mode of transmission: Secondary | ICD-10-CM

## 2015-12-27 DIAGNOSIS — N943 Premenstrual tension syndrome: Secondary | ICD-10-CM

## 2015-12-27 MED ORDER — ACYCLOVIR 400 MG PO TABS: 400.0000 mg | ORAL_TABLET | Freq: Two times a day (BID) | ORAL | Status: DC

## 2015-12-27 NOTE — Patient Instructions (Signed)
You only need the acyclovir twice a day when you are not pregnant--new prescription is for twice daily

## 2015-12-27 NOTE — Progress Notes (Signed)
[facility] Visit  Patient name: Miranda [name] MRN [id number]  Date of birth: 17-Mar-1981  CC & HPI:  Miranda [name] is a 35 y.o. Caucasian female presenting today for abdominal cramping for 2 days--it resolved when she started her period.  She is now off of her period.  She just restarted COCs, so hopefully that will decrease her cramps over time.  She also requests  GC/CHL screening "just to be safe." recently had HIV .  Also requests acyclovir refill to take daily Pertinent History Reviewed:  Medical & Surgical Hx:   Past Medical History  Diagnosis Date  . Thyroid disease   . Pregnant   . Heart valve problem   . Palpitation   . Chest pain 07/24/2012    2D Echo EF 60%-65% which shoewd a bicuspid aortic vavle with mild stenosis  . PVC (premature ventricular contraction) 07/24/2012    stress test which was normal with good excercise  . Diabetes mellitus     type 2 on insulin  . Herpes   . Mental disorder     depression  . Vaginal discharge 04/14/2014  . Irregular periods 04/14/2014  . Yeast infection 05/27/2015   Past Surgical History  Procedure Laterality Date  . No past surgeries     Family History  Problem Relation Age of Onset  . Hypertension Mother   . Diabetes Brother   . Diabetes Maternal Grandmother   . Congestive Heart Failure Maternal Grandmother     Current outpatient prescriptions:  .  amoxicillin (AMOXIL) 250 MG capsule, Take 250 mg by mouth 3 (three) times daily., Disp: , Rfl:  .  levothyroxine (SYNTHROID, LEVOTHROID) 125 MCG tablet, Take 125 mcg by mouth daily before breakfast., Disp: , Rfl:  .  metFORMIN (GLUCOPHAGE-XR) 500 MG 24 hr tablet, Take 1,000 mg by mouth daily with breakfast., Disp: , Rfl:  .  prednisoLONE 5 MG TABS tablet, Take by mouth., Disp: , Rfl:  .  acyclovir (ZOVIRAX) 400 MG tablet, Take 1 tablet (400 mg total) by mouth 2 (two) times daily., Disp: 60 tablet, Rfl: 6 .  FLUoxetine HCl (PROZAC PO), Take by mouth., Disp: , Rfl:  Social  History: Reviewed -  reports that she has been smoking Cigarettes.  She has a 5 pack-year smoking history. She has never used smokeless tobacco.  Review of Systems:   Constitutional: Negative for fever and chills Eyes: Negative for visual disturbances Respiratory: Negative for shortness of breath, dyspnea Cardiovascular: Negative for chest pain or palpitations  Gastrointestinal: Negative for vomiting, diarrhea and constipation; no abdominal pain Genitourinary: Negative for dysuria and urgency, vaginal irritation or itching Musculoskeletal: Negative for back pain, joint pain, myalgias  Neurological: Negative for dizziness and headaches    Objective Findings:    Physical Examination: General appearance - well appearing, and in no distress Mental status - alert, oriented to person, place, and time Chest:  Normal respiratory effort Heart - normal rate and regular rhythm Abdomen:  Soft, nontender Pelvic: SSE:  Normal appearing dc, normal vagina, vulva, cervix Musculoskeletal:  Normal range of motion without pain Extremities:  No edema    No results found for this or any previous visit (from the past 24 hour(s)).    Assessment & Plan:  A:   Premenstrual cramping, resolved  STD screen P:   Orders Placed This Encounter  Procedures  . GC/Chlamydia Probe Amp      Return if symptoms worsen or fail to improve.  CRESENZO-DISHMAN,Salvatrice Morandi CNM 12/27/2015  3:12 PM

## 2015-12-28 LAB — GC/CHLAMYDIA PROBE AMP
CHLAMYDIA, DNA PROBE: NEGATIVE
NEISSERIA GONORRHOEAE BY PCR: NEGATIVE

## 2015-12-30 ENCOUNTER — Telehealth: Payer: Self-pay | Admitting: Advanced Practice Midwife

## 2015-12-30 NOTE — Telephone Encounter (Signed)
Pt called stating that she would like the results of her lab results. Please contact pt

## 2015-12-30 NOTE — Telephone Encounter (Signed)
Pt informed of negative GC/CHL from 12/27/2015.

## 2016-02-28 ENCOUNTER — Encounter: Payer: Self-pay | Admitting: Obstetrics and Gynecology

## 2016-02-28 ENCOUNTER — Other Ambulatory Visit (HOSPITAL_COMMUNITY)
Admission: RE | Admit: 2016-02-28 | Discharge: 2016-02-28 | Disposition: A | Discharge status: Home / Self Care | Payer: Medicaid Other | Source: Ambulatory Visit | Attending: Obstetrics and Gynecology | Admitting: Obstetrics and Gynecology

## 2016-02-28 ENCOUNTER — Ambulatory Visit (INDEPENDENT_AMBULATORY_CARE_PROVIDER_SITE_OTHER): Payer: Medicaid Other | Admitting: Obstetrics and Gynecology

## 2016-02-28 VITALS — BP 120/90 | HR 74 | Ht 68.0 in | Wt 197.5 lb

## 2016-02-28 DIAGNOSIS — Z1151 Encounter for screening for human papillomavirus (HPV): Secondary | ICD-10-CM | POA: Diagnosis present

## 2016-02-28 DIAGNOSIS — Z01419 Encounter for gynecological examination (general) (routine) without abnormal findings: Secondary | ICD-10-CM | POA: Diagnosis not present

## 2016-02-28 DIAGNOSIS — Z113 Encounter for screening for infections with a predominantly sexual mode of transmission: Secondary | ICD-10-CM | POA: Diagnosis present

## 2016-02-28 DIAGNOSIS — Z0000 Encounter for general adult medical examination without abnormal findings: Secondary | ICD-10-CM

## 2016-02-28 DIAGNOSIS — Z124 Encounter for screening for malignant neoplasm of cervix: Secondary | ICD-10-CM

## 2016-02-28 NOTE — Progress Notes (Signed)
Patient ID: Miranda AlamoMary S [name], female   DOB: 1981-05-09, 35 y.o.   MRN: [id number]  Assessment:  Annual Gyn Exam   Plan:  1. pap smear done, next pap due 3 yr 2. return annually or prn 3    Annual mammogram advised at 39 Subjective:  Miranda [name] is a 35 y.o. female G1P1001 who presents for annual exam. Patient's last menstrual period was 02/12/2016. The patient has complaints today of weight is up , also noted slight persistent milk d/c from breast after breast feeding. Was on SSRI but quit it , self-d/c'd  The following portions of the patient's history were reviewed and updated as appropriate: allergies, current medications, past family history, past medical history, past social history, past surgical history and problem list. Past Medical History  Diagnosis Date  . Thyroid disease   . Pregnant   . Heart valve problem   . Palpitation   . Chest pain 07/24/2012    2D Echo EF 60%-65% which shoewd a bicuspid aortic vavle with mild stenosis  . PVC (premature ventricular contraction) 07/24/2012    stress test which was normal with good excercise  . Diabetes mellitus     type 2 on insulin  . Herpes   . Mental disorder     depression  . Vaginal discharge 04/14/2014  . Irregular periods 04/14/2014  . Yeast infection 05/27/2015    Past Surgical History  Procedure Laterality Date  . No past surgeries       Current outpatient prescriptions:  .  acyclovir (ZOVIRAX) 400 MG tablet, Take 1 tablet (400 mg total) by mouth 2 (two) times daily., Disp: 60 tablet, Rfl: 6 .  levothyroxine (SYNTHROID, LEVOTHROID) 125 MCG tablet, Take 125 mcg by mouth daily before breakfast., Disp: , Rfl:  .  metFORMIN (GLUCOPHAGE-XR) 500 MG 24 hr tablet, Take 1,000 mg by mouth daily with breakfast., Disp: , Rfl:   Review of Systems Constitutional: negative xx wt gain, caring for child w/o problems. Gastrointestinal: negative Genitourinary: irreg periods , unchanged.  Objective:  BP 120/90 mmHg  Pulse 74  Ht 5'  8" (1.727 m)  Wt 197 lb 8 oz (89.585 kg)  BMI 30.04 kg/m2  LMP 02/12/2016   BMI: Body mass index is 30.04 kg/(m^2).  General Appearance: Alert, appropriate appearance for age. No acute distress HEENT: Grossly normal Neck / Thyroid:  Cardiovascular: RRR; normal S1, S2, no murmur Lungs: CTA bilaterally Back: No CVAT Breast Exam: No masses or nodes.No dimpling, nipple retraction or discharge. Gastrointestinal: Soft, non-tender, no masses or organomegaly Pelvic Exam: Vulva and vagina appear normal. Bimanual exam reveals normal uterus and adnexa. Rectovaginal: not indicated Lymphatic Exam: Non-palpable nodes in neck, clavicular, axillary, or inguinal regions Skin: no rash or abnormalities Neurologic: Normal gait and speech, no tremor  Psychiatric: Alert and oriented, appropriate affect.  Urinalysis:Not done  Christin BachJohn Nettie Wyffels. MD Pgr [phone] 3:18 PM

## 2016-03-01 LAB — CYTOLOGY - PAP

## 2016-06-13 ENCOUNTER — Encounter: Payer: Self-pay | Admitting: Obstetrics and Gynecology

## 2016-06-13 ENCOUNTER — Ambulatory Visit (INDEPENDENT_AMBULATORY_CARE_PROVIDER_SITE_OTHER): Payer: Medicaid Other | Admitting: Obstetrics and Gynecology

## 2016-06-13 VITALS — BP 140/88 | HR 88 | Ht 68.0 in | Wt 195.2 lb

## 2016-06-13 DIAGNOSIS — R1030 Lower abdominal pain, unspecified: Secondary | ICD-10-CM | POA: Diagnosis not present

## 2016-06-13 DIAGNOSIS — N760 Acute vaginitis: Secondary | ICD-10-CM | POA: Diagnosis not present

## 2016-06-13 DIAGNOSIS — N898 Other specified noninflammatory disorders of vagina: Secondary | ICD-10-CM

## 2016-06-13 MED ORDER — METRONIDAZOLE 500 MG PO TABS: 500.0000 mg | ORAL_TABLET | Freq: Two times a day (BID) | ORAL | 1 refills | Status: DC

## 2016-06-13 NOTE — Progress Notes (Signed)
Patient ID: Miranda [name], female   DOB: [birth year], 35 y.o.   MRN: [id number]    [name] Visit  @DATE @            Patient name: Miranda [name] MRN [id number]  Date of birth: [birth year]  CC & HPI:   Chief Complaint  Patient presents with  . vaginal itching and irritation with odor    "stomach pain"     Miranda [name] is a 35 y.o. female presenting today for abdominal pain, malodorous vaginal discharge and vaginal irritation. She states she has tried apple cider vinegar, yogurt tampons, H2O2 tampons to alleviate her symptoms, but these treatments have provided no relief. She denies vaginal itching.   She states she has been having unprotected sex with her child's father; however, they have recently gotten back together and reports she is not sure if he has or had other partners.    ROS:  Review of Systems  Gastrointestinal: Positive for abdominal pain.  Genitourinary:       +malodorous vaginal discharge, vaginal irritation    Pertinent History Reviewed:   Reviewed Medical         Past Medical History:  Diagnosis Date  . Chest pain 07/24/2012   2D Echo EF 60%-65% which shoewd a bicuspid aortic vavle with mild stenosis  . Diabetes mellitus    type 2 on insulin  . Heart valve problem   . Herpes   . Irregular periods 04/14/2014  . Mental disorder    depression  . Palpitation   . Pregnant   . PVC (premature ventricular contraction) 07/24/2012   stress test which was normal with good excercise  . Thyroid disease   . Vaginal discharge 04/14/2014  . Yeast infection 05/27/2015                              Surgical Hx:    Past Surgical History:  Procedure Laterality Date  . NO PAST SURGERIES     Medications: Reviewed & Updated - see associated section                       Current Outpatient Prescriptions:  .  acyclovir (ZOVIRAX) 400 MG tablet, Take 1 tablet (400 mg total) by mouth 2 (two) times daily., Disp: 60 tablet, Rfl: 6 .  levothyroxine (SYNTHROID,  LEVOTHROID) 125 MCG tablet, Take 125 mcg by mouth daily before breakfast., Disp: , Rfl:  .  metFORMIN (GLUCOPHAGE-XR) 500 MG 24 hr tablet, Take 1,000 mg by mouth daily with breakfast., Disp: , Rfl:    Social History: Reviewed -  reports that she has been smoking Cigarettes.  She has a 2.50 pack-year smoking history. She has never used smokeless tobacco.  Objective Findings:  Vitals: Blood pressure 140/88, pulse 88, height 5\' 8"  (1.727 m), weight 195 lb 3.2 oz (88.5 kg), last menstrual period 05/22/2016.  Physical Examination: General appearance - alert, well appearing, and in no distress Mental status - alert, oriented to person, place, and time Abdomen - soft, nontender, nondistended, no masses or organomegaly Pelvic -  VULVA:No masses, tenderness or lesions. Mild erythema secondary to vaginal discharge  VAGINA: normal appearing vagina with normal color and discharge, no lesions,  CERVIX: normal appearing cervix without discharge or lesions UTERUS: uterus is normal size, shape, consistency and nontender ADNEXA: normal adnexa in size, nontender and no masses.  Musculoskeletal - no joint tenderness, deformity or  swelling Extremities - peripheral pulses normal, no pedal edema, no clubbing or cyanosis Skin - normal coloration and turgor, no rashes, no suspicious skin lesions noted   Assessment & Plan:   A: BV 1. Abdominal pain, vaginal irritation, malodorous vaginal discharge  2. Vulvar irritation secondary to vaginal discharge  3. GC/CHL collected again today  4. Wet prep, KOH collected / +clue cells, moderate amount of white cells  5. +BV  P:  1. Will Rx PO Metronidazole  2. Pt to f/u by phone in 5 days if oral tx has provided no relief so we can send in rx for Metrogel  3. Advised pt to sign up for MyChart    By signing my name below, I, Doreatha MartinEva Mathews, attest that this documentation has been prepared under the direction and in the presence of Tilda BurrowJohn V Laval Cafaro, MD. Electronically  Signed: Doreatha MartinEva Mathews, ED Scribe. 06/13/16. 3:04 PM.  I personally performed the services described in this documentation, which was SCRIBED in my presence. The recorded information has been reviewed and considered accurate. It has been edited as necessary during review. Tilda BurrowFERGUSON,Jontavious Commons V, MD

## 2016-06-14 LAB — GC/CHLAMYDIA PROBE AMP
CHLAMYDIA, DNA PROBE: NEGATIVE
NEISSERIA GONORRHOEAE BY PCR: NEGATIVE

## 2016-07-22 IMAGING — CT CT ABD-PELV W/ CM
2 of 4 series · 16 of 46 positions shown, 18 images · IV contrast (Omnipaque 300)
Comparison: None available currently.

CLINICAL DATA: Acute right lower quadrant abdominal pain.

EXAM:
CT ABDOMEN AND PELVIS WITH CONTRAST
TECHNIQUE: Multidetector CT imaging of the abdomen and pelvis was performed
using the standard protocol following bolus administration of
intravenous contrast.
CONTRAST:  25mL OMNIPAQUE IOHEXOL 300 MG/ML SOLN, 100mL OMNIPAQUE
IOHEXOL 300 MG/ML SOLN

[Series 2: abd_pel_with 5.0 b40f · axial · 0.71mm/px · z∈[-446,+19]mm · 13 of 103 slices shown, 15 images]
[im 5/103  soft-tissue]
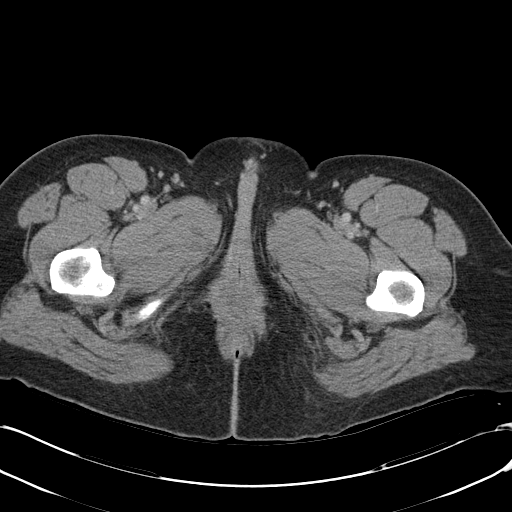
[im 5/103  bone]
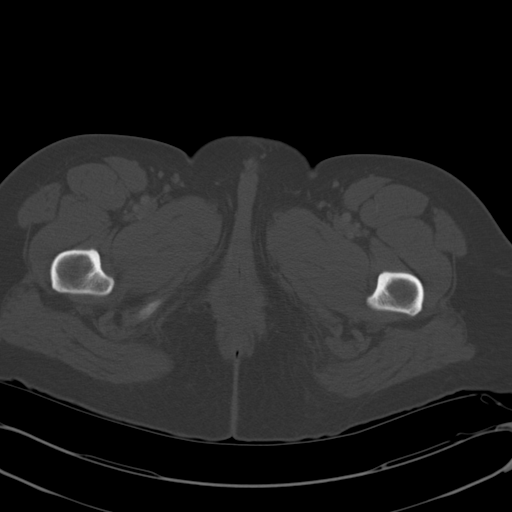
[im 13/103  soft-tissue]
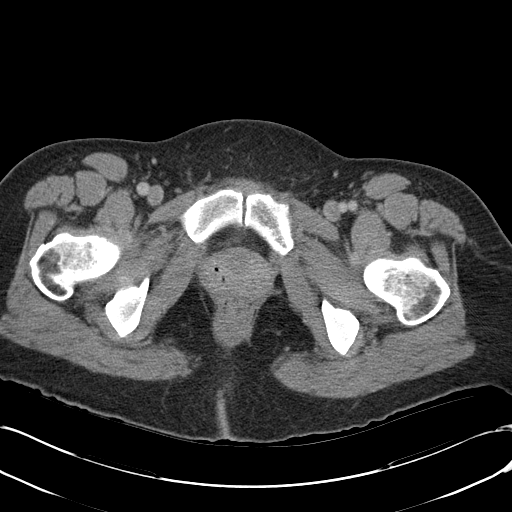
[im 22/103  soft-tissue]
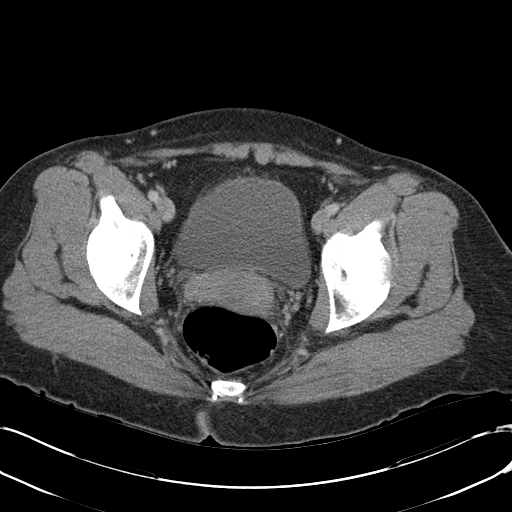
[im 30/103  soft-tissue]
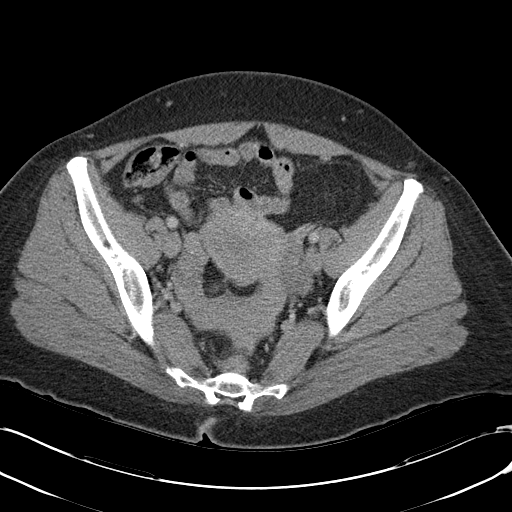
[im 35/103  soft-tissue]
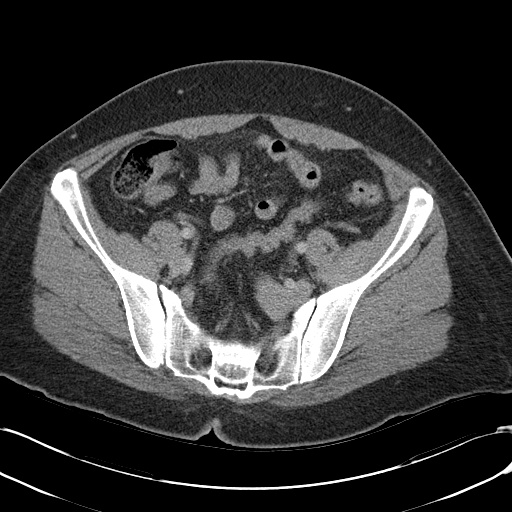
[im 43/103  soft-tissue]
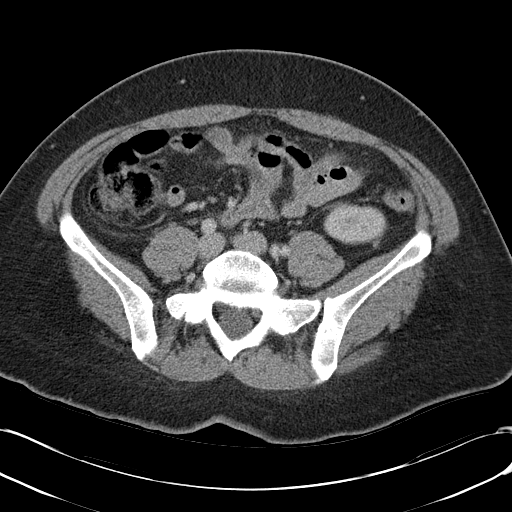
[im 52/103  soft-tissue]
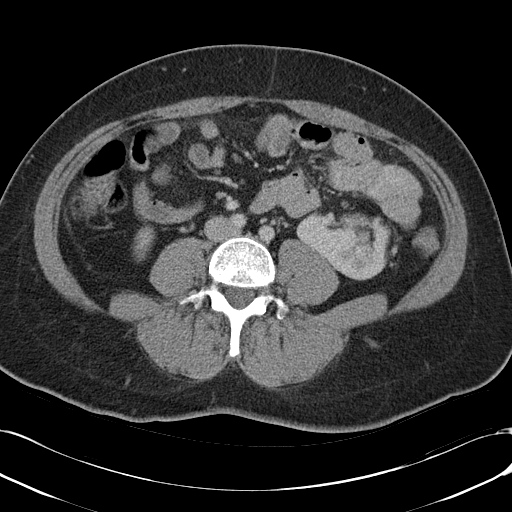
[im 60/103  soft-tissue]
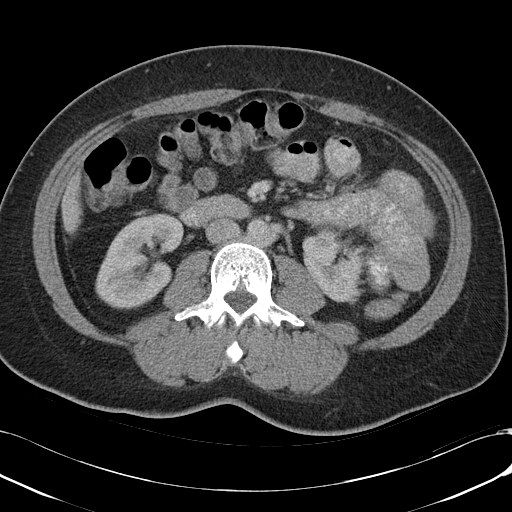
[im 69/103  soft-tissue]
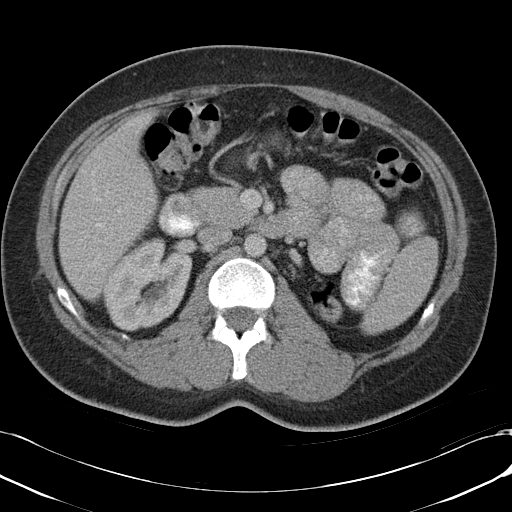
[im 69/103  bone]
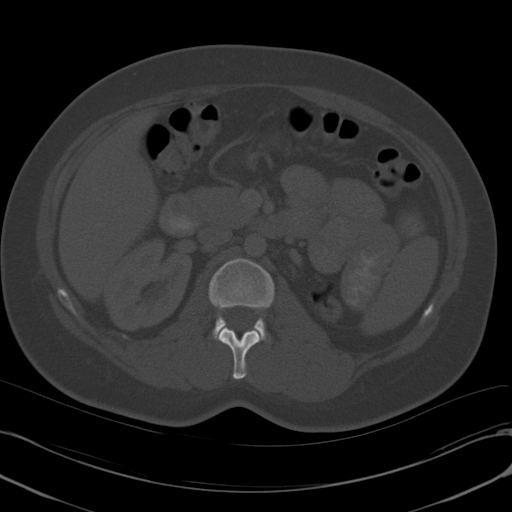
[im 73/103  soft-tissue]
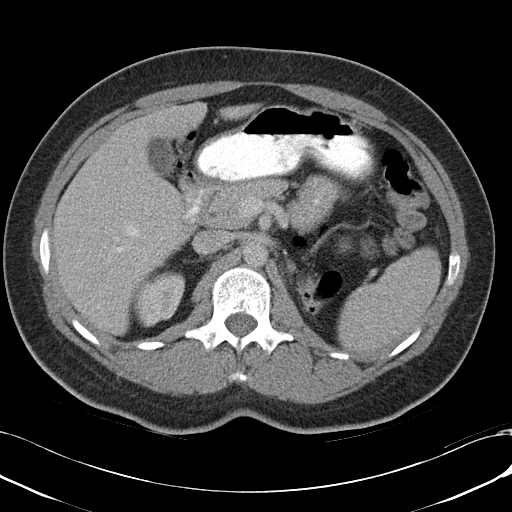
[im 81/103  soft-tissue]
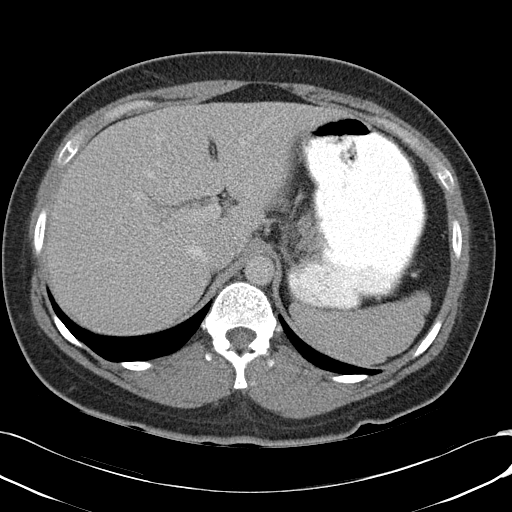
[im 90/103  soft-tissue]
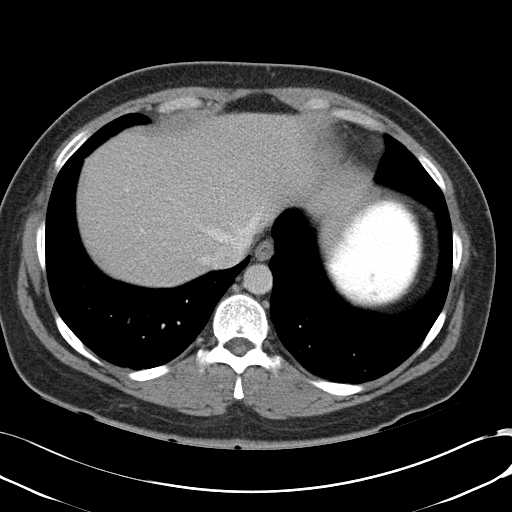
[im 98/103  soft-tissue]
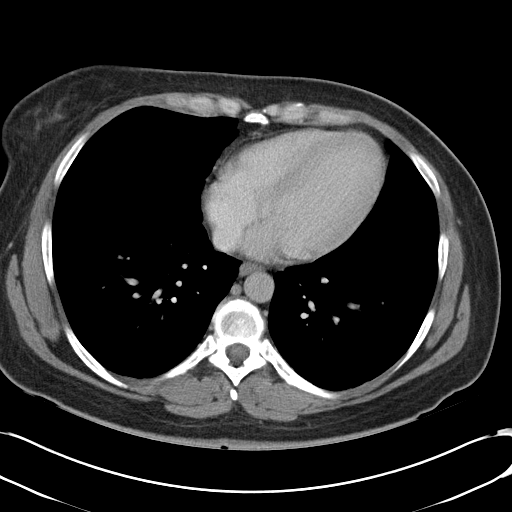

[Series 4: abd_pel_with 3.0 spo cor · coronal · 0.75mm/px · 3 of 87 slices shown]
[im 29/87  soft-tissue]
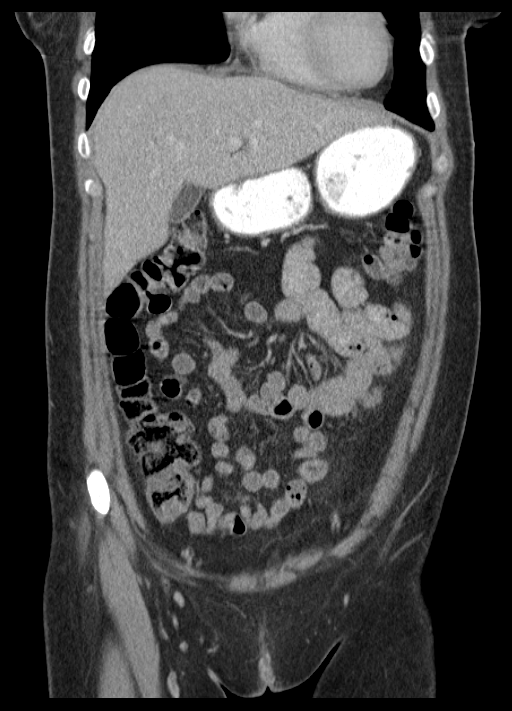
[im 39/87  soft-tissue]
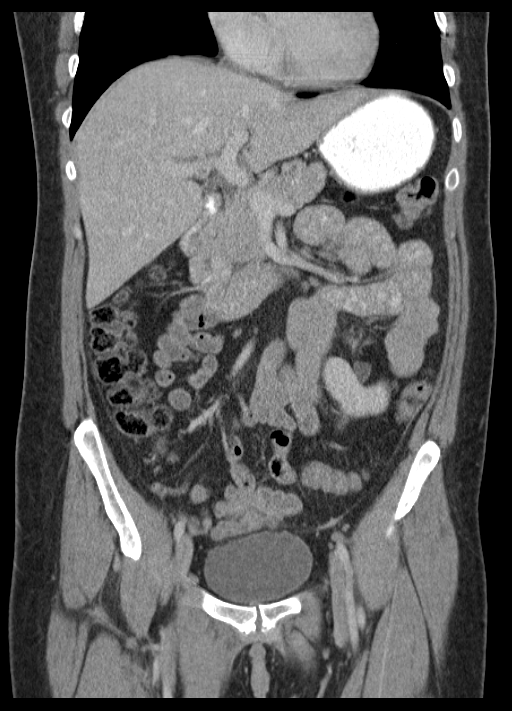
[im 48/87  soft-tissue]
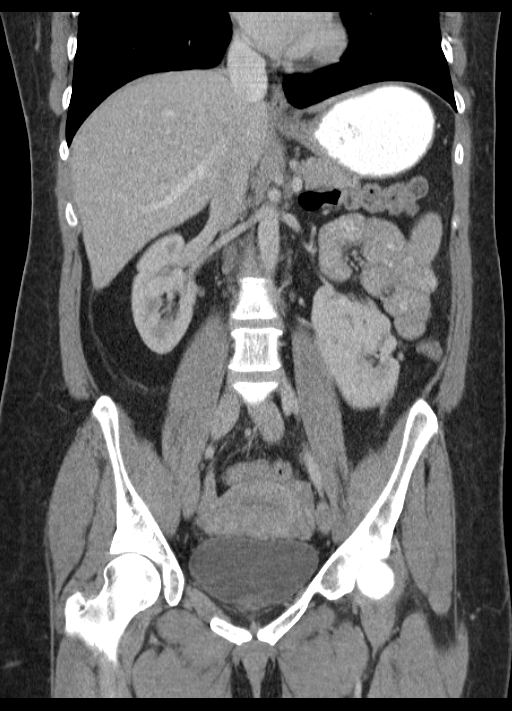

[16 of 46 positions shown; findings below may reference images not displayed]

FINDINGS: Visualized lung bases appear normal. No significant osseous
abnormality is noted.

No gallstones are noted. The liver, spleen and pancreas appear
normal. Adrenal glands and right kidney appear normal. There appears
to be congenital malrotation of the left kidney. No hydronephrosis
or renal obstruction is noted. The appendix appears normal. There is
no evidence of bowel obstruction. No abnormal fluid collection is
noted. Uterus and ovaries appear normal. Urinary bladder appears
normal. No significant adenopathy is noted.
IMPRESSION: Probable congenital malrotation of the left kidney. No other
abnormality seen in the abdomen or pelvis.

## 2016-07-23 ENCOUNTER — Encounter: Payer: Self-pay | Admitting: Obstetrics and Gynecology

## 2016-07-23 ENCOUNTER — Ambulatory Visit (INDEPENDENT_AMBULATORY_CARE_PROVIDER_SITE_OTHER): Payer: Medicaid Other | Admitting: Obstetrics and Gynecology

## 2016-07-23 VITALS — BP 120/88 | HR 94 | Ht 68.0 in | Wt 196.2 lb

## 2016-07-23 DIAGNOSIS — Z202 Contact with and (suspected) exposure to infections with a predominantly sexual mode of transmission: Secondary | ICD-10-CM | POA: Diagnosis not present

## 2016-07-23 DIAGNOSIS — Z113 Encounter for screening for infections with a predominantly sexual mode of transmission: Secondary | ICD-10-CM

## 2016-07-23 DIAGNOSIS — R300 Dysuria: Secondary | ICD-10-CM | POA: Diagnosis not present

## 2016-07-23 LAB — POCT URINALYSIS DIPSTICK
Glucose, UA: NEGATIVE
KETONES UA: NEGATIVE
LEUKOCYTES UA: NEGATIVE
NITRITE UA: NEGATIVE
PROTEIN UA: NEGATIVE
RBC UA: NEGATIVE

## 2016-07-23 NOTE — Progress Notes (Signed)
   [facility] Visit  07/23/16           Patient name: Miranda AlamoMary S [name] MRN [id number]  Date of birth: 1980-11-12  CC & HPI:  Miranda [name] is a 35 y.o. female presenting today for possible trich infection. Patient states she recently was sexually involved with someone whose prior partner tested positive for trich. She is asymptomatic at this time. States she was taking metronidazole last month for BV.   ROS:  ROS Negative vaginal discharge   Pertinent History Reviewed:   Reviewed: Significant for  Medical         Past Medical History:  Diagnosis Date  . Chest pain 07/24/2012   2D Echo EF 60%-65% which shoewd a bicuspid aortic vavle with mild stenosis  . Diabetes mellitus    type 2 on insulin  . Heart valve problem   . Herpes   . Irregular periods 04/14/2014  . Mental disorder    depression  . Palpitation   . Pregnant   . PVC (premature ventricular contraction) 07/24/2012   stress test which was normal with good excercise  . Thyroid disease   . Vaginal discharge 04/14/2014  . Yeast infection 05/27/2015                              Surgical Hx:    Past Surgical History:  Procedure Laterality Date  . NO PAST SURGERIES     Medications: Reviewed & Updated - see associated section                       Current Outpatient Prescriptions:  .  acyclovir (ZOVIRAX) 400 MG tablet, Take 1 tablet (400 mg total) by mouth 2 (two) times daily., Disp: 60 tablet, Rfl: 6 .  levothyroxine (SYNTHROID, LEVOTHROID) 125 MCG tablet, Take 125 mcg by mouth daily before breakfast., Disp: , Rfl:  .  metFORMIN (GLUCOPHAGE-XR) 500 MG 24 hr tablet, Take 1,000 mg by mouth daily with breakfast., Disp: , Rfl:    Social History: Reviewed -  reports that she has been smoking Cigarettes.  She has a 2.50 pack-year smoking history. She has never used smokeless tobacco.  Objective Findings:  Vitals: Blood pressure 120/88, pulse 94, height 5\' 8"  (1.727 m), weight 196 lb 3.2 oz (89 kg), last menstrual period  06/22/2016.  Physical Examination: General appearance - alert, well appearing, and in no distress Mental status - alert, oriented to person, place, and time Pelvic - VULVA: normal appearing vulva with no masses, tenderness or lesions,  VAGINA: normal appearing vagina with normal color and discharge, no lesions,  CERVIX: normal appearing cervix without discharge or lesions,  Wet prep: ;+ wbc, no clue, + lactobacilli, No trich  Assessment & Plan:   A:  1. STD contact- trich   P:  1. No evidence of trich, await GC/Chlamydia results 2. Follow up PRN    By signing my name below, I, Sonum Patel, attest that this documentation has been prepared under the direction and in the presence of Tilda BurrowJohn V Ahlana Slaydon, MD. Electronically Signed: Sonum Patel, Neurosurgeoncribe. 07/23/16. 3:47 PM.  I personally performed the services described in this documentation, which was SCRIBED in my presence. The recorded information has been reviewed and considered accurate. It has been edited as necessary during review. Tilda BurrowFERGUSON,Mikael Skoda V, MD

## 2016-07-25 LAB — URINE CULTURE

## 2016-07-25 LAB — GC/CHLAMYDIA PROBE AMP
CHLAMYDIA, DNA PROBE: NEGATIVE
Neisseria gonorrhoeae by PCR: NEGATIVE

## 2016-11-29 ENCOUNTER — Other Ambulatory Visit: Payer: Self-pay | Admitting: Advanced Practice Midwife

## 2016-12-03 ENCOUNTER — Other Ambulatory Visit: Payer: Self-pay | Admitting: *Deleted

## 2016-12-04 MED ORDER — ACYCLOVIR 400 MG PO TABS: 400.0000 mg | ORAL_TABLET | Freq: Two times a day (BID) | ORAL | 6 refills | Status: DC

## 2016-12-24 DIAGNOSIS — H1045 Other chronic allergic conjunctivitis: Secondary | ICD-10-CM | POA: Diagnosis not present

## 2017-02-22 DIAGNOSIS — S161XXA Strain of muscle, fascia and tendon at neck level, initial encounter: Secondary | ICD-10-CM | POA: Diagnosis not present

## 2017-11-27 ENCOUNTER — Encounter: Payer: Self-pay | Admitting: Obstetrics and Gynecology

## 2017-11-27 ENCOUNTER — Ambulatory Visit (INDEPENDENT_AMBULATORY_CARE_PROVIDER_SITE_OTHER): Payer: Self-pay | Admitting: Obstetrics and Gynecology

## 2017-11-27 VITALS — BP 120/80 | HR 97 | Ht 68.0 in | Wt 213.4 lb

## 2017-11-27 DIAGNOSIS — Z113 Encounter for screening for infections with a predominantly sexual mode of transmission: Secondary | ICD-10-CM

## 2017-11-27 DIAGNOSIS — N898 Other specified noninflammatory disorders of vagina: Secondary | ICD-10-CM

## 2017-11-27 DIAGNOSIS — Z3202 Encounter for pregnancy test, result negative: Secondary | ICD-10-CM

## 2017-11-27 LAB — POCT WET PREP (WET MOUNT): Trichomonas Wet Prep HPF POC: ABSENT

## 2017-11-27 LAB — POCT URINE PREGNANCY: Preg Test, Ur: NEGATIVE

## 2017-11-27 MED ORDER — FLUCONAZOLE 150 MG PO TABS: 150.0000 mg | ORAL_TABLET | Freq: Once | ORAL | 1 refills | Status: AC

## 2017-11-27 MED ORDER — NYSTATIN-TRIAMCINOLONE 100000-0.1 UNIT/GM-% EX OINT: 1.0000 "application " | TOPICAL_OINTMENT | Freq: Two times a day (BID) | CUTANEOUS | 99 refills | Status: DC

## 2017-11-27 NOTE — Progress Notes (Addendum)
[facility] Visit  @DATE @            Patient name: Miranda [name] MRN [id number]  Date of birth: [birth year]  CC & HPI:  Miranda [name] is a 37 y.o. female presenting today for vaginal itching that started about 4 days ago. She tried yeast infection OTC medication without relief. She reports that she was recently cheated on and would like an STD screening. She denies vaginal swelling, fever, chills or any other symptoms or complaints at this time.  She acknowledges recent infidelity by her partner GC and chlamydia cultures were negative in November she requests retest ROS:  ROS +vaginal itching -vaginal swelling -fever -chills All systems are negative except as noted in the HPI and PMH.   Pertinent History Reviewed:   Reviewed: Significant for yeast infection, vaginal discharge Medical         Past Medical History:  Diagnosis Date  . Chest pain 07/24/2012   2D Echo EF 60%-65% which shoewd a bicuspid aortic vavle with mild stenosis  . Diabetes mellitus    type 2 on insulin  . Heart valve problem   . Herpes   . Irregular periods 04/14/2014  . Mental disorder    depression  . Palpitation   . Pregnant   . PVC (premature ventricular contraction) 07/24/2012   stress test which was normal with good excercise  . Thyroid disease   . Vaginal discharge 04/14/2014  . Yeast infection 05/27/2015                              Surgical Hx:    Past Surgical History:  Procedure Laterality Date  . NO PAST SURGERIES     Medications: Reviewed & Updated - see associated section                       Current Outpatient Medications:  .  acyclovir (ZOVIRAX) 400 MG tablet, Take 1 tablet (400 mg total) by mouth 2 (two) times daily., Disp: 60 tablet, Rfl: 6 .  levothyroxine (SYNTHROID, LEVOTHROID) 125 MCG tablet, Take 125 mcg by mouth daily before breakfast., Disp: , Rfl:  .  metFORMIN (GLUCOPHAGE-XR) 500 MG 24 hr tablet, Take 1,000 mg by mouth daily with breakfast., Disp: , Rfl:    Social  History: Reviewed -  reports that she has been smoking cigarettes.  She has a 2.50 pack-year smoking history. she has never used smokeless tobacco.  Objective Findings:  Vitals: Blood pressure 120/80, pulse 97, height 5\' 8"  (1.727 m), weight 213 lb 6.4 oz (96.8 kg), last menstrual period 09/21/2017.  PHYSICAL EXAMINATION General appearance - alert, well appearing, and in no distress and oriented to person, place, and time Mental status - alert, oriented to person, place, and time, normal mood, behavior, speech, dress, motor activity, and thought processes, affect appropriate to mood  PELVIC External genitalia - normal Vagina - clear white secretions Wet [place] - normal epi, no trich, no clue   Assessment & Plan:   A:  1. Vulvar itching 2. STD screen  P:  1. Rx Triamcinolone 2. Rx Nystatin 3.  GC and chlamydia collected  By signing my name below, I, Diona BrownerJennifer Gorman, attest that this documentation has been prepared under the direction and in the presence of Tilda BurrowFerguson, Husam Hohn V, MD. Electronically Signed: Diona BrownerJennifer Gorman, Medical Scribe. 11/27/17. 9:18 AM.  I personally performed the services described in this documentation,  which was SCRIBED in my presence. The recorded information has been reviewed and considered accurate. It has been edited as necessary during review. Jonnie Kind, MD

## 2017-11-29 LAB — GC/CHLAMYDIA PROBE AMP
Chlamydia trachomatis, NAA: NEGATIVE
Neisseria gonorrhoeae by PCR: NEGATIVE

## 2017-12-04 ENCOUNTER — Other Ambulatory Visit: Payer: Self-pay | Admitting: Obstetrics and Gynecology

## 2018-03-19 ENCOUNTER — Other Ambulatory Visit: Payer: Self-pay

## 2018-03-19 ENCOUNTER — Encounter (HOSPITAL_COMMUNITY): Payer: Self-pay | Admitting: Emergency Medicine

## 2018-03-19 ENCOUNTER — Ambulatory Visit (HOSPITAL_COMMUNITY)
Admission: EM | Admit: 2018-03-19 | Discharge: 2018-03-19 | Disposition: A | Discharge status: Home / Self Care | Payer: Medicaid Other | Attending: Family Medicine | Admitting: Family Medicine

## 2018-03-19 DIAGNOSIS — Z8249 Family history of ischemic heart disease and other diseases of the circulatory system: Secondary | ICD-10-CM | POA: Diagnosis not present

## 2018-03-19 DIAGNOSIS — E039 Hypothyroidism, unspecified: Secondary | ICD-10-CM | POA: Insufficient documentation

## 2018-03-19 DIAGNOSIS — E119 Type 2 diabetes mellitus without complications: Secondary | ICD-10-CM | POA: Diagnosis not present

## 2018-03-19 DIAGNOSIS — M545 Low back pain: Secondary | ICD-10-CM | POA: Insufficient documentation

## 2018-03-19 DIAGNOSIS — Z3201 Encounter for pregnancy test, result positive: Secondary | ICD-10-CM

## 2018-03-19 DIAGNOSIS — R350 Frequency of micturition: Secondary | ICD-10-CM | POA: Insufficient documentation

## 2018-03-19 DIAGNOSIS — Z794 Long term (current) use of insulin: Secondary | ICD-10-CM | POA: Diagnosis not present

## 2018-03-19 DIAGNOSIS — Z79890 Hormone replacement therapy (postmenopausal): Secondary | ICD-10-CM | POA: Diagnosis not present

## 2018-03-19 DIAGNOSIS — Z881 Allergy status to other antibiotic agents status: Secondary | ICD-10-CM | POA: Diagnosis not present

## 2018-03-19 DIAGNOSIS — Z79899 Other long term (current) drug therapy: Secondary | ICD-10-CM | POA: Insufficient documentation

## 2018-03-19 DIAGNOSIS — R1084 Generalized abdominal pain: Secondary | ICD-10-CM | POA: Insufficient documentation

## 2018-03-19 DIAGNOSIS — K5900 Constipation, unspecified: Secondary | ICD-10-CM | POA: Insufficient documentation

## 2018-03-19 DIAGNOSIS — F17210 Nicotine dependence, cigarettes, uncomplicated: Secondary | ICD-10-CM | POA: Insufficient documentation

## 2018-03-19 LAB — POCT URINALYSIS DIP (DEVICE)
Bilirubin Urine: NEGATIVE
Glucose, UA: 500 mg/dL — AB
HGB URINE DIPSTICK: NEGATIVE
Ketones, ur: NEGATIVE mg/dL
Leukocytes, UA: NEGATIVE
NITRITE: NEGATIVE
PH: 7 (ref 5.0–8.0)
PROTEIN: NEGATIVE mg/dL
Specific Gravity, Urine: 1.015 (ref 1.005–1.030)
UROBILINOGEN UA: 0.2 mg/dL (ref 0.0–1.0)

## 2018-03-19 LAB — POCT PREGNANCY, URINE: PREG TEST UR: POSITIVE — AB

## 2018-03-19 MED ORDER — POLYETHYLENE GLYCOL 3350 17 G PO PACK: 17.0000 g | PACK | Freq: Every day | ORAL | 0 refills | Status: DC

## 2018-03-19 NOTE — Discharge Instructions (Signed)
Urine pregnancy positive.  No alarming signs on exam.  As discussed, generalized abdominal pain could be caused by constipation as well.  Start MiraLAX as directed.  Continue to monitor abdominal pain, if worsening, having vaginal bleeding, [facility].  You may need to be switched off metformin while you are pregnant, please follow-up with OB/GYN for further evaluation and management needed.  Please follow-up with your PCP as well.

## 2018-03-19 NOTE — ED Triage Notes (Signed)
Generalized abdominal pain and back pain for 3 weeks.  Denies vaginal discharge, denies urinary symptoms.  Normal bm this morning with the exception of darker than usual.

## 2018-03-19 NOTE — ED Provider Notes (Signed)
[facility]    CSN: [id number] Arrival date & time: 03/19/18  1047     History   Chief Complaint Chief Complaint  Patient presents with  . Abdominal Pain    HPI Miranda [name] is a 37 y.o. female.   37 year old female with history of type 2 diabetes, hypothyroidism comes in for 3-week history of generalized abdominal pain.  States pain is constant, but waxes and wanes in intensity, cramping in sensation.  The pain is worsened after eating, noticed junction between fatty foods/spicy foods.  Denies nausea, vomiting.  Last BM this morning with straining.  States has had constipation for quite a while.  Has also has some urinary frequency without  dysuria, hematuria.  Denies vaginal discharge, itching, spotting.  She is sexually active with one female partner, no condom use.  No birth control use.  LMP 02/16/2018. She is also had back pain for the past few days, low bilateral back pain, intermittent.  States that back pain can occur when abdominal pain is worse.  No obvious alleviating factor.  Denies pain with movement.  Denies injury/trauma.  Denies saddle anesthesia, loss of bladder or bowel control.  She has not taken anything for the symptoms.  Does not recall last A1c, but states that was "okay".      Past Medical History:  Diagnosis Date  . Chest pain 07/24/2012   2D Echo EF 60%-65% which shoewd a bicuspid aortic vavle with mild stenosis  . Diabetes mellitus    type 2 on insulin  . Heart valve problem   . Herpes   . Irregular periods 04/14/2014  . Mental disorder    depression  . Palpitation   . Pregnant   . PVC (premature ventricular contraction) 07/24/2012   stress test which was normal with good excercise  . Thyroid disease   . Vaginal discharge 04/14/2014  . Yeast infection 05/27/2015    Patient Active Problem List   Diagnosis Date Noted  . Yeast infection 05/27/2015  . Vaginal discharge 04/14/2014  . Irregular periods 04/14/2014  . Smoker 10/21/2013  .  Depression 10/21/2013  . Palpitations 03/03/2013  . Type II diabetes mellitus (HCC) 02/04/2013  . Hypothyroidism 01/19/2013    Past Surgical History:  Procedure Laterality Date  . NO PAST SURGERIES      OB History    Gravida  1   Para  1   Term  1   Preterm      AB      Living  1     SAB      TAB      Ectopic      Multiple      Live Births  1            Home Medications    Prior to Admission medications   Medication Sig Start Date End Date Taking? Authorizing Provider  acyclovir (ZOVIRAX) 400 MG tablet TAKE 1 TABLET BY MOUTH TWICE DAILY 12/05/17   Tilda BurrowFerguson, John V, MD  levothyroxine (SYNTHROID, LEVOTHROID) 125 MCG tablet Take 125 mcg by mouth daily before breakfast.    [provider]  metFORMIN (GLUCOPHAGE-XR) 500 MG 24 hr tablet Take 1,000 mg by mouth daily with breakfast.    [provider]  nystatin-triamcinolone ointment (MYCOLOG) Apply 1 application topically 2 (two) times daily. To affected area. 11/27/17   Tilda BurrowFerguson, John V, MD  polyethylene glycol [facility](MIRALAX) packet Take 17 g by mouth daily. 03/19/18   Belinda FisherYu, Amy V, PA-C  Family History Family History  Problem Relation Age of Onset  . Hypertension Mother   . Diabetes Brother   . Diabetes Maternal Grandmother   . Congestive Heart Failure Maternal Grandmother     Social History Social History   Tobacco Use  . Smoking status: Current Every Day Smoker    Packs/day: 0.50    Years: 5.00    Pack years: 2.50    Types: Cigarettes    Last attempt to quit: 05/20/2012    Years since quitting: 5.8  . Smokeless tobacco: Never Used  Substance Use Topics  . Alcohol use: No    Alcohol/week: 0.0 oz  . Drug use: No     Allergies   Keflex [cephalexin]   Review of Systems Review of Systems  Reason unable to perform ROS: See HPI as above.     Physical Exam Triage Vital Signs ED Triage Vitals  Enc Vitals Group     BP 03/19/18 1114 (!) 153/81     Pulse Rate 03/19/18 1114 86      Resp 03/19/18 1114 20     Temp 03/19/18 1114 98.3 F (36.8 C)     Temp Source 03/19/18 1114 Oral     SpO2 03/19/18 1114 100 %     Weight --      Height --      Head Circumference --      Peak Flow --      Pain Score 03/19/18 1112 6     Pain Loc --      Pain Edu? --      Excl. in GC? --    No data found.  Updated Vital Signs BP (!) 153/81 (BP Location: Left Arm)   Pulse 86   Temp 98.3 F (36.8 C) (Oral)   Resp 20   LMP 02/16/2018   SpO2 100%   Physical Exam  Constitutional: She is oriented to person, place, and time. She appears well-developed and well-nourished. No distress.  HENT:  Head: Normocephalic and atraumatic.  Eyes: Pupils are equal, round, and reactive to light. Conjunctivae are normal.  Cardiovascular: Normal rate, regular rhythm and normal heart sounds. Exam reveals no gallop and no friction rub.  No murmur heard. Pulmonary/Chest: Effort normal and breath sounds normal. No stridor. No respiratory distress. She has no wheezes. She has no rhonchi. She has no rales.  Abdominal: Soft. Bowel sounds are normal. She exhibits no mass. There is no tenderness. There is no rigidity, no rebound, no guarding and no CVA tenderness.  Musculoskeletal:  No tenderness to palpation of the back, hip.  Full range of motion. Strength normal and equal bilaterally. Sensation intact and equal bilaterally.   Neurological: She is alert and oriented to person, place, and time.  Skin: Skin is warm and dry.  Psychiatric: She has a normal mood and affect. Her behavior is normal. Judgment normal.     UC Treatments / Results  Labs (all labs ordered are listed, but only abnormal results are displayed) Labs Reviewed  POCT URINALYSIS DIP (DEVICE) - Abnormal; Notable for the following components:      Result Value   Glucose, UA 500 (*)    All other components within normal limits  POCT PREGNANCY, URINE - Abnormal; Notable for the following components:   Preg Test, Ur POSITIVE (*)    All  other components within normal limits  URINE CYTOLOGY ANCILLARY ONLY     Lab Results  Component Value Date   HGBA1C 5.4 04/01/2013  EKG None  Radiology No results found.  Procedures Procedures (including critical care time)  Medications Ordered in UC Medications - No data to display  Initial Impression / Assessment and Plan / UC Course  I have reviewed the triage vital signs and the nursing notes.  Pertinent labs & imaging results that were available during my care of the patient were reviewed by me and considered in my medical decision making (see chart for details).    No alarming signs on exam.  Patient without tenderness to palpation of abdomen.  Urine pregnancy positive.  Negative for UTI.  Discussed possible constipation causing abdominal cramping.  Will have patient start MiraLAX.  Monitor closely.  Strict return precautions given.  Patient to follow-up with OB/GYN for further evaluation and management needed.  Will have patient follow-up with PCP for evaluation, and possible medication change given patient's on metformin right now.  Final Clinical Impressions(s) / UC Diagnoses   Final diagnoses:  Generalized abdominal pain  Positive pregnancy test    ED Prescriptions    Medication Sig Dispense Auth. Provider   polyethylene glycol (MIRALAX) packet Take 17 g by mouth daily. [address] each Threasa Alpha, PA-C 03/19/18 1205

## 2018-03-20 LAB — URINE CYTOLOGY ANCILLARY ONLY
CHLAMYDIA, DNA PROBE: NEGATIVE
NEISSERIA GONORRHEA: NEGATIVE
Trichomonas: NEGATIVE

## 2018-03-24 LAB — URINE CYTOLOGY ANCILLARY ONLY
BACTERIAL VAGINITIS: NEGATIVE
Candida vaginitis: NEGATIVE

## 2018-03-27 ENCOUNTER — Ambulatory Visit: Payer: Medicaid Other | Admitting: Adult Health

## 2018-03-31 ENCOUNTER — Other Ambulatory Visit: Payer: Self-pay | Admitting: Obstetrics and Gynecology

## 2018-03-31 DIAGNOSIS — O3680X0 Pregnancy with inconclusive fetal viability, not applicable or unspecified: Secondary | ICD-10-CM

## 2018-04-01 ENCOUNTER — Ambulatory Visit (INDEPENDENT_AMBULATORY_CARE_PROVIDER_SITE_OTHER): Payer: Medicaid Other

## 2018-04-01 DIAGNOSIS — O3680X0 Pregnancy with inconclusive fetal viability, not applicable or unspecified: Secondary | ICD-10-CM | POA: Diagnosis not present

## 2018-04-01 DIAGNOSIS — Z3A01 Less than 8 weeks gestation of pregnancy: Secondary | ICD-10-CM | POA: Diagnosis not present

## 2018-04-01 NOTE — Progress Notes (Signed)
US 6 wks,single IUP w/ys,positive fht 111 bpm,normal ovaries bilat,crl 3.8 mm

## 2018-04-16 ENCOUNTER — Encounter: Payer: Self-pay | Admitting: Advanced Practice Midwife

## 2018-04-16 ENCOUNTER — Ambulatory Visit (INDEPENDENT_AMBULATORY_CARE_PROVIDER_SITE_OTHER): Payer: Medicaid Other | Admitting: Advanced Practice Midwife

## 2018-04-16 ENCOUNTER — Ambulatory Visit: Payer: Medicaid Other | Admitting: *Deleted

## 2018-04-16 VITALS — BP 133/97 | HR 80 | Wt 222.0 lb

## 2018-04-16 DIAGNOSIS — Z1389 Encounter for screening for other disorder: Secondary | ICD-10-CM

## 2018-04-16 DIAGNOSIS — O99281 Endocrine, nutritional and metabolic diseases complicating pregnancy, first trimester: Secondary | ICD-10-CM | POA: Diagnosis not present

## 2018-04-16 DIAGNOSIS — Z3682 Encounter for antenatal screening for nuchal translucency: Secondary | ICD-10-CM

## 2018-04-16 DIAGNOSIS — O24111 Pre-existing diabetes mellitus, type 2, in pregnancy, first trimester: Secondary | ICD-10-CM | POA: Diagnosis not present

## 2018-04-16 DIAGNOSIS — O0990 Supervision of high risk pregnancy, unspecified, unspecified trimester: Secondary | ICD-10-CM

## 2018-04-16 DIAGNOSIS — O99331 Smoking (tobacco) complicating pregnancy, first trimester: Secondary | ICD-10-CM

## 2018-04-16 DIAGNOSIS — E039 Hypothyroidism, unspecified: Secondary | ICD-10-CM | POA: Diagnosis not present

## 2018-04-16 DIAGNOSIS — F32A Depression, unspecified: Secondary | ICD-10-CM

## 2018-04-16 DIAGNOSIS — Z331 Pregnant state, incidental: Secondary | ICD-10-CM

## 2018-04-16 DIAGNOSIS — E118 Type 2 diabetes mellitus with unspecified complications: Secondary | ICD-10-CM

## 2018-04-16 DIAGNOSIS — F17210 Nicotine dependence, cigarettes, uncomplicated: Secondary | ICD-10-CM

## 2018-04-16 DIAGNOSIS — Z3A08 8 weeks gestation of pregnancy: Secondary | ICD-10-CM | POA: Diagnosis not present

## 2018-04-16 DIAGNOSIS — O0991 Supervision of high risk pregnancy, unspecified, first trimester: Secondary | ICD-10-CM

## 2018-04-16 DIAGNOSIS — F329 Major depressive disorder, single episode, unspecified: Secondary | ICD-10-CM

## 2018-04-16 LAB — POCT URINALYSIS DIPSTICK
Blood, UA: NEGATIVE
GLUCOSE UA: NEGATIVE
KETONES UA: NEGATIVE
Leukocytes, UA: NEGATIVE
Nitrite, UA: NEGATIVE
Protein, UA: NEGATIVE

## 2018-04-16 MED ORDER — METFORMIN HCL 500 MG PO TABS: 500.0000 mg | ORAL_TABLET | Freq: Two times a day (BID) | ORAL | 11 refills | Status: DC

## 2018-04-16 MED ORDER — ESCITALOPRAM OXALATE 10 MG PO TABS: 10.0000 mg | ORAL_TABLET | Freq: Every day | ORAL | 6 refills | Status: DC

## 2018-04-16 MED ORDER — ACYCLOVIR 400 MG PO TABS: 400.0000 mg | ORAL_TABLET | Freq: Three times a day (TID) | ORAL | 11 refills | Status: DC

## 2018-04-16 NOTE — Patient Instructions (Signed)
 First Trimester of Pregnancy The first trimester of pregnancy is from week 1 until the end of week 12 (months 1 through 3). A week after a sperm fertilizes an egg, the egg will implant on the wall of the uterus. This embryo will begin to develop into a baby. Genes from you and your partner are forming the baby. The female genes determine whether the baby is a boy or a girl. At 6-8 weeks, the eyes and face are formed, and the heartbeat can be seen on ultrasound. At the end of 12 weeks, all the baby's organs are formed.  Now that you are pregnant, you will want to do everything you can to have a healthy baby. Two of the most important things are to get good prenatal care and to follow your health care provider's instructions. Prenatal care is all the medical care you receive before the baby's birth. This care will help prevent, find, and treat any problems during the pregnancy and childbirth. BODY CHANGES Your body goes through many changes during pregnancy. The changes vary from woman to woman.   You may gain or lose a couple of pounds at first.  You may feel sick to your stomach (nauseous) and throw up (vomit). If the vomiting is uncontrollable, call your health care provider.  You may tire easily.  You may develop headaches that can be relieved by medicines approved by your health care provider.  You may urinate more often. Painful urination may mean you have a bladder infection.  You may develop heartburn as a result of your pregnancy.  You may develop constipation because certain hormones are causing the muscles that push waste through your intestines to slow down.  You may develop hemorrhoids or swollen, bulging veins (varicose veins).  Your breasts may begin to grow larger and become tender. Your nipples may stick out more, and the tissue that surrounds them (areola) may become darker.  Your gums may bleed and may be sensitive to brushing and flossing.  Dark spots or blotches  (chloasma, mask of pregnancy) may develop on your face. This will likely fade after the baby is born.  Your menstrual periods will stop.  You may have a loss of appetite.  You may develop cravings for certain kinds of food.  You may have changes in your emotions from day to day, such as being excited to be pregnant or being concerned that something may go wrong with the pregnancy and baby.  You may have more vivid and strange dreams.  You may have changes in your hair. These can include thickening of your hair, rapid growth, and changes in texture. Some women also have hair loss during or after pregnancy, or hair that feels dry or thin. Your hair will most likely return to normal after your baby is born. WHAT TO EXPECT AT YOUR PRENATAL VISITS During a routine prenatal visit:  You will be weighed to make sure you and the baby are growing normally.  Your blood pressure will be taken.  Your abdomen will be measured to track your baby's growth.  The fetal heartbeat will be listened to starting around week 10 or 12 of your pregnancy.  Test results from any previous visits will be discussed. Your health care provider may ask you:  How you are feeling.  If you are feeling the baby move.  If you have had any abnormal symptoms, such as leaking fluid, bleeding, severe headaches, or abdominal cramping.  If you have any questions. Other   tests that may be performed during your first trimester include:  Blood tests to find your blood type and to check for the presence of any previous infections. They will also be used to check for low iron levels (anemia) and Rh antibodies. Later in the pregnancy, blood tests for diabetes will be done along with other tests if problems develop.  Urine tests to check for infections, diabetes, or protein in the urine.  An ultrasound to confirm the proper growth and development of the baby.  An amniocentesis to check for possible genetic problems.  Fetal  screens for spina bifida and Down syndrome.  You may need other tests to make sure you and the baby are doing well. HOME CARE INSTRUCTIONS  Medicines  Follow your health care provider's instructions regarding medicine use. Specific medicines may be either safe or unsafe to take during pregnancy.  Take your prenatal vitamins as directed.  If you develop constipation, try taking a stool softener if your health care provider approves. Diet  Eat regular, well-balanced meals. Choose a variety of foods, such as meat or vegetable-based protein, fish, milk and low-fat dairy products, vegetables, fruits, and whole grain breads and cereals. Your health care provider will help you determine the amount of weight gain that is right for you.  Avoid raw meat and uncooked cheese. These carry germs that can cause birth defects in the baby.  Eating four or five small meals rather than three large meals a day may help relieve nausea and vomiting. If you start to feel nauseous, eating a few soda crackers can be helpful. Drinking liquids between meals instead of during meals also seems to help nausea and vomiting.  If you develop constipation, eat more high-fiber foods, such as fresh vegetables or fruit and whole grains. Drink enough fluids to keep your urine clear or pale yellow. Activity and Exercise  Exercise only as directed by your health care provider. Exercising will help you:  Control your weight.  Stay in shape.  Be prepared for labor and delivery.  Experiencing pain or cramping in the lower abdomen or low back is a good sign that you should stop exercising. Check with your health care provider before continuing normal exercises.  Try to avoid standing for long periods of time. Move your legs often if you must stand in one place for a long time.  Avoid heavy lifting.  Wear low-heeled shoes, and practice good posture.  You may continue to have sex unless your health care provider directs you  otherwise. Relief of Pain or Discomfort  Wear a good support bra for breast tenderness.   Take warm sitz baths to soothe any pain or discomfort caused by hemorrhoids. Use hemorrhoid cream if your health care provider approves.   Rest with your legs elevated if you have leg cramps or low back pain.  If you develop varicose veins in your legs, wear support hose. Elevate your feet for 15 minutes, 3-4 times a day. Limit salt in your diet. Prenatal Care  Schedule your prenatal visits by the twelfth week of pregnancy. They are usually scheduled monthly at first, then more often in the last 2 months before delivery.  Write down your questions. Take them to your prenatal visits.  Keep all your prenatal visits as directed by your health care provider. Safety  Wear your seat belt at all times when driving.  Make a list of emergency phone numbers, including numbers for family, friends, the hospital, and police and fire departments. General   Tips  Ask your health care provider for a referral to a local prenatal education class. Begin classes no later than at the beginning of month 6 of your pregnancy.  Ask for help if you have counseling or nutritional needs during pregnancy. Your health care provider can offer advice or refer you to specialists for help with various needs.  Do not use hot tubs, steam rooms, or saunas.  Do not douche or use tampons or scented sanitary pads.  Do not cross your legs for long periods of time.  Avoid cat litter boxes and soil used by cats. These carry germs that can cause birth defects in the baby and possibly loss of the fetus by miscarriage or stillbirth.  Avoid all smoking, herbs, alcohol, and medicines not prescribed by your health care provider. Chemicals in these affect the formation and growth of the baby.  Schedule a dentist appointment. At home, brush your teeth with a soft toothbrush and be gentle when you floss. SEEK MEDICAL CARE IF:   You have  dizziness.  You have mild pelvic cramps, pelvic pressure, or nagging pain in the abdominal area.  You have persistent nausea, vomiting, or diarrhea.  You have a bad smelling vaginal discharge.  You have pain with urination.  You notice increased swelling in your face, hands, legs, or ankles. SEEK IMMEDIATE MEDICAL CARE IF:   You have a fever.  You are leaking fluid from your vagina.  You have spotting or bleeding from your vagina.  You have severe abdominal cramping or pain.  You have rapid weight gain or loss.  You vomit blood or material that looks like coffee grounds.  You are exposed to German measles and have never had them.  You are exposed to fifth disease or chickenpox.  You develop a severe headache.  You have shortness of breath.  You have any kind of trauma, such as from a fall or a car accident. Document Released: 09/18/2001 Document Revised: 02/08/2014 Document Reviewed: 08/04/2013 ExitCare Patient Information 2015 [employer]. This information is not intended to replace advice given to you by your health care provider. Make sure you discuss any questions you have with your health care provider.   Nausea & Vomiting  Have saltine crackers or pretzels by your bed and eat a few bites before you raise your head out of bed in the morning  Eat small frequent meals throughout the day instead of large meals  Drink plenty of fluids throughout the day to stay hydrated, just don't drink a lot of fluids with your meals.  This can make your stomach fill up faster making you feel sick  Do not brush your teeth right after you eat  Products with real ginger are good for nausea, like ginger ale and ginger hard candy Make sure it says made with real ginger!  Sucking on sour candy like lemon heads is also good for nausea  If your prenatal vitamins make you nauseated, take them at night so you will sleep through the nausea  Sea Bands  If you feel like you need  medicine for the nausea & vomiting please let us know  If you are unable to keep any fluids or food down please let us know   Constipation  Drink plenty of fluid, preferably water, throughout the day  Eat foods high in fiber such as fruits, vegetables, and grains  Exercise, such as walking, is a good way to keep your bowels regular  Drink warm fluids, especially warm   prune juice, or decaf coffee  Eat a 1/2 cup of real oatmeal (not instant), 1/2 cup applesauce, and 1/2-1 cup warm prune juice every day  If needed, you may take Colace (docusate sodium) stool softener once or twice a day to help keep the stool soft. If you are pregnant, wait until you are out of your first trimester (12-14 weeks of pregnancy)  If you still are having problems with constipation, you may take Miralax once daily as needed to help keep your bowels regular.  If you are pregnant, wait until you are out of your first trimester (12-14 weeks of pregnancy)  Safe Medications in Pregnancy   Acne: Benzoyl Peroxide Salicylic Acid  Backache/Headache: Tylenol: 2 regular strength every 4 hours OR              2 Extra strength every 6 hours  Colds/Coughs/Allergies: Benadryl (alcohol free) 25 mg every 6 hours as needed Breath right strips Claritin Cepacol throat lozenges Chloraseptic throat spray Cold-Eeze- up to three times per day Cough drops, alcohol free Flonase (by prescription only) Guaifenesin Mucinex Robitussin DM (plain only, alcohol free) Saline nasal spray/drops Sudafed (pseudoephedrine) & Actifed ** use only after [redacted] weeks gestation and if you do not have high blood pressure Tylenol Vicks Vaporub Zinc lozenges Zyrtec   Constipation: Colace Ducolax suppositories Fleet enema Glycerin suppositories Metamucil Milk of magnesia Miralax Senokot Smooth move tea  Diarrhea: Kaopectate Imodium A-D  *NO pepto Bismol  Hemorrhoids: Anusol Anusol HC Preparation  H Tucks  Indigestion: Tums Maalox Mylanta Zantac  Pepcid  Insomnia: Benadryl (alcohol free) 25mg every 6 hours as needed Tylenol PM Unisom, no Gelcaps  Leg Cramps: Tums MagGel  Nausea/Vomiting:  Bonine Dramamine Emetrol Ginger extract Sea bands Meclizine  Nausea medication to take during pregnancy:  Unisom (doxylamine succinate 25 mg tablets) Take one tablet daily at bedtime. If symptoms are not adequately controlled, the dose can be increased to a maximum recommended dose of two tablets daily (1/2 tablet in the morning, 1/2 tablet mid-afternoon and one at bedtime). Vitamin B6 100mg tablets. Take one tablet twice a day (up to 200 mg per day).  Skin Rashes: Aveeno products Benadryl cream or 25mg every 6 hours as needed Calamine Lotion 1% cortisone cream  Yeast infection: Gyne-lotrimin 7 Monistat 7   **If taking multiple medications, please check labels to avoid duplicating the same active ingredients **take medication as directed on the label ** Do not exceed 4000 mg of tylenol in 24 hours **Do not take medications that contain aspirin or ibuprofen      

## 2018-04-16 NOTE — Progress Notes (Signed)
Subjective:    Miranda [name] is a G2P1001 [redacted]w[redacted]d being seen today for her first obstetrical visit.  Her obstetrical history is significant for Class B DM and hypothyroid.  She hasn't been taking Metformin as prescribed d/t not having any health insurance.  She has 4 tablets left of her synthroid, can't remember exact dosage (will call and let us know). 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  Pregnancy history fully reviewed. She had been on lantus w/previous pregnancy by the time it was over.  Patient reports depression, feels like she needs to restart medication.  Marland KitchenHas been on wellbutrin, lexapro, and prozac in the past. [facility] referral,   Accepted.  Doesn't have a good relationship wFOB, having a hard time w/being pregnant.  Has good attitude, however, and a desire to get better and take good care of herself. Smokes now 5-6/day from 1ppd. . Discussed cessation strategies including e cigs.   Vitals:   04/16/18 1343  BP: (!) 133/97  Pulse: 80  Weight: 222 lb (100.7 kg)    HISTORY: OB History  Gravida Para Term Preterm AB Living  2 1 1     1   SAB TAB Ectopic Multiple Live Births          1    # Outcome Date GA Lbr Len/2nd Weight Sex Delivery Anes PTL Lv  2 Current           1 Term 07/26/13 [redacted]w[redacted]d -03:03 / 04:26 7 lb 2.3 oz (3.24 kg) F Vag-Vacuum EPI N LIV     Birth Comments: caput   Past Medical History:  Diagnosis Date  . Chest pain 07/24/2012   2D Echo EF 60%-65% which shoewd a bicuspid aortic vavle with mild stenosis  . Diabetes mellitus    type 2 on insulin  . Heart valve problem   . Herpes   . Irregular periods 04/14/2014  . Mental disorder    depression, postpartum depression  . Palpitation   . Pregnant   . PVC (premature ventricular contraction) 07/24/2012   stress test which was normal with good excercise  . Thyroid disease   . Vaginal discharge 04/14/2014  . Yeast infection 05/27/2015   Past Surgical History:  Procedure Laterality Date  . WISDOM TOOTH  EXTRACTION     Family History  Problem Relation Age of Onset  . Hypertension Mother   . Diabetes Brother   . Diabetes Maternal Grandmother   . Congestive Heart Failure Maternal Grandmother   . Hearing loss Daughter      Exam       Pelvic Exam:    Perineum: Normal Perineum   Vulva: normal   Vagina:  normal mucosa, normal discharge, no palpable nodules   Uterus Normal, Gravid, FH: 8             Urinary:  urethral meatus normal    System:     Skin: normal coloration and turgor, no rashes    Neurologic: oriented, normal, normal mood   Extremities: normal strength, tone, and muscle mass   HEENT PERRLA   Mouth/Teeth mucous membranes moist, normal dentition   Neck supple and no masses   Cardiovascular: regular rate and rhythm   Respiratory:  appears well, vitals normal, no respiratory distress, acyanotic   Abdomen: soft, non-tender;  FHR: 150 [place]        The nature of [facility] with multiple MDs and other Advanced Practice Providers was explained to patient; also emphasized  that residents, students are part of our team.  Assessment:    Pregnancy: G2P1001 Patient Active Problem List   Diagnosis Date Noted  . Type II diabetes mellitus (HCC) 02/04/2013    Priority: Medium  . Hypothyroidism 01/19/2013    Priority: Low  . Supervision of high risk pregnancy, antepartum 04/16/2018  . Smoker 10/21/2013  . Depression 10/21/2013  . Palpitations 03/03/2013        Plan:     Initial labs drawn. Continue prenatal vitamins  ASA 162mg  >12 weeks Lexapro 10mg  qd Restart Metformin 500mg  BID Start QID BS testing; doesn't want to enter into Babyscripts A1C/TSH Behavioral health referral sent via EPIC Problem list reviewed and updated   Reviewed recommended weight gain based on pre-gravid BMI  Encouraged well-balanced diet Genetic Screening discussed Integrated Screen: requested.  Ultrasound discussed; fetal survey: requested.  Return  in about 3 weeks (around 05/07/2018) for LROB and 8/11-13 for NT/IT/HROB.  Jacklyn ShellFrances Cresenzo-Dishmon 04/16/2018

## 2018-04-17 ENCOUNTER — Encounter (INDEPENDENT_AMBULATORY_CARE_PROVIDER_SITE_OTHER): Payer: Self-pay

## 2018-04-17 ENCOUNTER — Other Ambulatory Visit: Payer: Self-pay | Admitting: Advanced Practice Midwife

## 2018-04-17 LAB — OBSTETRIC PANEL, INCLUDING HIV
Antibody Screen: NEGATIVE
BASOS ABS: 0 10*3/uL (ref 0.0–0.2)
Basos: 0 %
EOS (ABSOLUTE): 0.3 10*3/uL (ref 0.0–0.4)
Eos: 2 %
HEP B S AG: NEGATIVE
HIV SCREEN 4TH GENERATION: NONREACTIVE
Hematocrit: 39.6 % (ref 34.0–46.6)
Hemoglobin: 13.3 g/dL (ref 11.1–15.9)
IMMATURE GRANULOCYTES: 0 %
Immature Grans (Abs): 0 10*3/uL (ref 0.0–0.1)
LYMPHS ABS: 2.7 10*3/uL (ref 0.7–3.1)
Lymphs: 24 %
MCH: 30.1 pg (ref 26.6–33.0)
MCHC: 33.6 g/dL (ref 31.5–35.7)
MCV: 90 fL (ref 79–97)
Monocytes Absolute: 0.8 10*3/uL (ref 0.1–0.9)
Monocytes: 7 %
NEUTROS PCT: 67 %
Neutrophils Absolute: 7.6 10*3/uL — ABNORMAL HIGH (ref 1.4–7.0)
PLATELETS: 335 10*3/uL (ref 150–450)
RBC: 4.42 x10E6/uL (ref 3.77–5.28)
RDW: 13.6 % (ref 12.3–15.4)
RPR: NONREACTIVE
Rh Factor: POSITIVE
Rubella Antibodies, IGG: 5.3 index (ref >0.99)
WBC: 11.3 10*3/uL — AB (ref 3.4–10.8)

## 2018-04-17 LAB — TSH: TSH: 7.89 u[IU]/mL — ABNORMAL HIGH (ref 0.450–4.500)

## 2018-04-17 LAB — PMP SCREEN PROFILE (10S), URINE
AMPHETAMINE SCREEN URINE: NEGATIVE ng/mL
BARBITURATE SCREEN URINE: NEGATIVE ng/mL
BENZODIAZEPINE SCREEN, URINE: NEGATIVE ng/mL
CANNABINOIDS UR QL SCN: NEGATIVE ng/mL
COCAINE(METAB.)SCREEN, URINE: NEGATIVE ng/mL
CREATININE(CRT), U: 29.4 mg/dL (ref 20.0–300.0)
METHADONE SCREEN, URINE: NEGATIVE ng/mL
OPIATE SCREEN URINE: NEGATIVE ng/mL
OXYCODONE+OXYMORPHONE UR QL SCN: NEGATIVE ng/mL
PHENCYCLIDINE QUANTITATIVE URINE: NEGATIVE ng/mL
PROPOXYPHENE SCREEN URINE: NEGATIVE ng/mL
Ph of Urine: 6.7 (ref 4.5–8.9)

## 2018-04-17 LAB — URINALYSIS, ROUTINE W REFLEX MICROSCOPIC
BILIRUBIN UA: NEGATIVE
GLUCOSE, UA: NEGATIVE
Ketones, UA: NEGATIVE
Leukocytes, UA: NEGATIVE
NITRITE UA: NEGATIVE
Protein, UA: NEGATIVE
RBC UA: NEGATIVE
SPEC GRAV UA: 1.009 (ref 1.005–1.030)
UUROB: 0.2 mg/dL (ref 0.2–1.0)
pH, UA: 6.5 (ref 5.0–7.5)

## 2018-04-17 LAB — GC/CHLAMYDIA PROBE AMP
Chlamydia trachomatis, NAA: NEGATIVE
Neisseria gonorrhoeae by PCR: NEGATIVE

## 2018-04-17 LAB — HEMOGLOBIN A1C
Est. average glucose Bld gHb Est-mCnc: 160 mg/dL
HEMOGLOBIN A1C: 7.2 % — AB (ref 4.8–5.6)

## 2018-04-17 MED ORDER — LEVOTHYROXINE SODIUM 150 MCG PO TABS: 150.0000 ug | ORAL_TABLET | Freq: Every day | ORAL | 6 refills | Status: DC

## 2018-04-17 NOTE — Progress Notes (Signed)
tsh 7. Increase Synthroid to 1.565mcg

## 2018-04-18 LAB — URINE CULTURE

## 2018-04-25 ENCOUNTER — Telehealth: Payer: Self-pay | Admitting: *Deleted

## 2018-04-25 ENCOUNTER — Other Ambulatory Visit (INDEPENDENT_AMBULATORY_CARE_PROVIDER_SITE_OTHER): Payer: Medicaid Other

## 2018-04-25 DIAGNOSIS — R350 Frequency of micturition: Secondary | ICD-10-CM

## 2018-04-25 DIAGNOSIS — R102 Pelvic and perineal pain: Secondary | ICD-10-CM

## 2018-04-25 DIAGNOSIS — Z331 Pregnant state, incidental: Secondary | ICD-10-CM

## 2018-04-25 DIAGNOSIS — Z1389 Encounter for screening for other disorder: Secondary | ICD-10-CM

## 2018-04-25 LAB — POCT URINALYSIS DIPSTICK OB
LEUKOCYTES UA: NEGATIVE
Nitrite, UA: NEGATIVE
RBC UA: NEGATIVE

## 2018-04-25 MED ORDER — NITROFURANTOIN MONOHYD MACRO 100 MG PO CAPS: 100.0000 mg | ORAL_CAPSULE | Freq: Two times a day (BID) | ORAL | 0 refills | Status: DC

## 2018-04-25 NOTE — Telephone Encounter (Signed)
Came in to leave urine sample.   Gllucose 4+, Ketone small, Protein trace. Pelvic pressure and urinary frequency.

## 2018-04-25 NOTE — Telephone Encounter (Signed)
Patient states she is having pelvic pain and feels like she needs to pee frequently.  Has had UTI's in the past.  Advised to come to our office in order for us to dip her urine. [place] may no be able to make it.  Advised if she can't to go to Urgent Care. Verbalized understanding.

## 2018-04-27 LAB — URINE CULTURE

## 2018-04-28 ENCOUNTER — Encounter: Payer: Self-pay | Admitting: Women's Health

## 2018-04-28 DIAGNOSIS — O2341 Unspecified infection of urinary tract in pregnancy, first trimester: Secondary | ICD-10-CM | POA: Insufficient documentation

## 2018-05-07 ENCOUNTER — Ambulatory Visit (INDEPENDENT_AMBULATORY_CARE_PROVIDER_SITE_OTHER): Payer: Medicaid Other | Admitting: Obstetrics and Gynecology

## 2018-05-07 ENCOUNTER — Other Ambulatory Visit: Payer: Self-pay | Admitting: *Deleted

## 2018-05-07 VITALS — BP 119/79 | HR 89 | Wt 219.8 lb

## 2018-05-07 DIAGNOSIS — E039 Hypothyroidism, unspecified: Secondary | ICD-10-CM

## 2018-05-07 DIAGNOSIS — O0991 Supervision of high risk pregnancy, unspecified, first trimester: Secondary | ICD-10-CM

## 2018-05-07 DIAGNOSIS — Q231 Congenital insufficiency of aortic valve: Secondary | ICD-10-CM

## 2018-05-07 DIAGNOSIS — E118 Type 2 diabetes mellitus with unspecified complications: Secondary | ICD-10-CM

## 2018-05-07 DIAGNOSIS — Z1389 Encounter for screening for other disorder: Secondary | ICD-10-CM

## 2018-05-07 DIAGNOSIS — Z331 Pregnant state, incidental: Secondary | ICD-10-CM

## 2018-05-07 DIAGNOSIS — Z3A11 11 weeks gestation of pregnancy: Secondary | ICD-10-CM

## 2018-05-07 DIAGNOSIS — O24111 Pre-existing diabetes mellitus, type 2, in pregnancy, first trimester: Secondary | ICD-10-CM

## 2018-05-07 DIAGNOSIS — O99281 Endocrine, nutritional and metabolic diseases complicating pregnancy, first trimester: Secondary | ICD-10-CM

## 2018-05-07 LAB — POCT URINALYSIS DIPSTICK OB
Blood, UA: NEGATIVE
GLUCOSE, UA: NEGATIVE — AB
Ketones, UA: NEGATIVE
Leukocytes, UA: NEGATIVE
Nitrite, UA: NEGATIVE
POC,PROTEIN,UA: NEGATIVE

## 2018-05-07 MED ORDER — INSULIN GLARGINE 100 UNIT/ML ~~LOC~~ SOLN: 20.0000 [IU] | Freq: Every day | SUBCUTANEOUS | 11 refills | Status: DC

## 2018-05-07 MED ORDER — INSULIN STARTER KIT- SYRINGES (ENGLISH): 1.0000 | Freq: Once | Status: DC

## 2018-05-07 NOTE — Assessment & Plan Note (Signed)
  Notes Recorded by Chrystie NoseKenneth C. Hilty, MD on 08/07/2013 at 4:59 PM Please let her know I looked at her echo - I can clearly see three cusps to the aortic valve, so it is not bicuspid, as was thought during her echo last year. This is very good news. No significant stenosis and trace to mild regurgitation. Overall the heart is squeezing well.  Dr. Rennis GoldenHilty

## 2018-05-07 NOTE — Progress Notes (Signed)
Patient ID: [name], female   DOB: 1981-10-05, 37 y.o.   MRN: [id number]    HIGH-RISK PREGNANCY VISIT Patient name: DEVANEY SEGERS MRN [id number]  Date of birth: [birth year] Chief Complaint:   High Risk Gestation  History of Present Illness:   ARLYNE BRANDES is a 37 y.o. G32P1001 female at [redacted]w[redacted]d with an Estimated Date of Delivery: 11/25/18 being seen today for ongoing management of a high-risk pregnancy complicated by DM and Hyperthyroidism, minimal aortic stenonsis and bicuspid valve. She has brought in her blood sugar log. She has been vomiting and feeling ill. She has a hx of postpartum depression.  Her fasting's are running from 125-189 and takes metformin takes twice a day. She has stopped taking her Lexapro and gives her headaches and and more depressed, and was only taking it so that she wouldn't have postpartum depression again. She says that she is miserable that she can't get done what she needs to get done. Today she reports vomiting. Contractions: Not present. Vag. Bleeding: None.   . denies leaking of fluid.  Review of Systems:   Pertinent items are noted in HPI Denies abnormal vaginal discharge w/ itching/odor/irritation, headaches, visual changes, shortness of breath, chest pain, abdominal pain, severe nausea/vomiting, or problems with urination or bowel movements unless otherwise stated above. Pertinent History Reviewed:  Reviewed past medical,surgical, social, obstetrical and family history.  Reviewed problem list, medications and allergies. Physical Assessment:   Vitals:   05/07/18 0903  BP: 119/79  Pulse: 89  Weight: 219 lb 12.8 oz (99.7 kg)  Body mass index is 33.42 kg/m.           Physical Examination:   General appearance: alert, well appearing, and in no distress and oriented to person, place, and time  Mental status: alert, oriented to person, place, and time, normal mood, behavior, speech, dress, motor activity, and thought processes, affect appropriate to  mood  Skin: warm & dry   Extremities: Edema: None    Cardiovascular: normal heart rate noted  Respiratory: normal respiratory effort, no distress  Abdomen: gravid, soft, non-tender  Pelvic: Cervical exam deferred         Fetal Status: Fetal Heart Rate (bpm): 165        Fetal Surveillance Testing today: None  Results for orders placed or performed in visit on 05/07/18 (from the past 24 hour(s))  POC Urinalysis Dipstick OB   Collection Time: 05/07/18  9:06 AM  Result Value Ref Range   Color, UA     Clarity, UA     Glucose, UA Negative (A) (none)   Bilirubin, UA     Ketones, UA neg    Spec Grav, UA  1.010 - 1.025   Blood, UA neg    pH, UA  5.0 - 8.0   POC Protein UA Negative Negative, Trace   Urobilinogen, UA  0.2 or 1.0 E.U./dL   Nitrite, UA neg    Leukocytes, UA Negative Negative   Appearance     Odor      Assessment & Plan:  1) High-risk pregnancy G2P1001 at [redacted]w[redacted]d with an Estimated Date of Delivery: 11/25/18   2) CHTN, stable  3) DM2, unstable NEEDS INSULIN, Will begin LANTUS 20 q pm, and have pt seen ASAP by Diabetic Educator, Patsey Berthold of [facility]. Appt coordinated by Jobe Marker 4  Question of Bicuspid aortic valve, ruled out 2014 by Dr Rennis Golden:      Notes Recorded by Chrystie Nose, MD on  08/07/2013 at 4:59 PM Please let her know I looked at her echo - I can clearly see three cusps to the aortic valve, so it is not bicuspid, as was thought during her echo last year. This is very good news. No significant stenosis and trace to mild regurgitation. Overall the heart is squeezing well.  Dr. Rennis GoldenHilty  Meds: No orders of the defined types were placed in this encounter.   Labs/procedures today: None  Treatment Plan:  1. Increase Metformin to 1000 mg BID 2. Rx Lantus 20 hs,  and Novalog to be considered next 3. Refer to Diabetic [facility] RangeEd, Penny Crumpton  Follow-up: No follow-ups on file.  Orders Placed This Encounter  Procedures  .  POC Urinalysis Dipstick OB   By signing my name below, I, Arnette NorrisMari Johnson, attest that this documentation has been prepared under the direction and in the presence of Tilda BurrowFerguson, Dhana Totton V, MD. Electronically Signed: Arnette NorrisMari Johnson Medical Scribe. 05/07/18. 9:18 AM.  I personally performed the services described in this documentation, which was SCRIBED in my presence. The recorded information has been reviewed and considered accurate. It has been edited as necessary during review. Tilda BurrowJohn V Gaylen Venning, MD

## 2018-05-12 ENCOUNTER — Telehealth: Payer: Self-pay | Admitting: Obstetrics and Gynecology

## 2018-05-12 NOTE — Telephone Encounter (Signed)
Patient called stating that she would like a call back from Dr. Emelda FearFerguson because the place that was suppose to call her to teach her how to give herself the insulin. Pt would like to know what she can do. Please contact pt

## 2018-05-12 NOTE — Telephone Encounter (Signed)
Spoke with pt. Pt hasn't heard from the diabetic education folks. She has an appt here tomorrow. I advised to bring that up at her appt if she don't hear back from them today. Pt voiced understanding. JSY

## 2018-05-13 ENCOUNTER — Ambulatory Visit (INDEPENDENT_AMBULATORY_CARE_PROVIDER_SITE_OTHER): Payer: Medicaid Other | Admitting: Women's Health

## 2018-05-13 ENCOUNTER — Encounter: Payer: Self-pay | Admitting: Women's Health

## 2018-05-13 VITALS — BP 110/73 | HR 92 | Wt 219.4 lb

## 2018-05-13 DIAGNOSIS — O2341 Unspecified infection of urinary tract in pregnancy, first trimester: Secondary | ICD-10-CM

## 2018-05-13 DIAGNOSIS — Z1389 Encounter for screening for other disorder: Secondary | ICD-10-CM

## 2018-05-13 DIAGNOSIS — O0991 Supervision of high risk pregnancy, unspecified, first trimester: Secondary | ICD-10-CM

## 2018-05-13 DIAGNOSIS — O24111 Pre-existing diabetes mellitus, type 2, in pregnancy, first trimester: Secondary | ICD-10-CM

## 2018-05-13 DIAGNOSIS — E039 Hypothyroidism, unspecified: Secondary | ICD-10-CM

## 2018-05-13 DIAGNOSIS — Z3682 Encounter for antenatal screening for nuchal translucency: Secondary | ICD-10-CM

## 2018-05-13 DIAGNOSIS — E118 Type 2 diabetes mellitus with unspecified complications: Secondary | ICD-10-CM

## 2018-05-13 DIAGNOSIS — Z331 Pregnant state, incidental: Secondary | ICD-10-CM

## 2018-05-13 DIAGNOSIS — O99281 Endocrine, nutritional and metabolic diseases complicating pregnancy, first trimester: Secondary | ICD-10-CM

## 2018-05-13 DIAGNOSIS — Z3A12 12 weeks gestation of pregnancy: Secondary | ICD-10-CM

## 2018-05-13 LAB — POCT URINALYSIS DIPSTICK OB
Blood, UA: NEGATIVE
Ketones, UA: NEGATIVE
LEUKOCYTES UA: NEGATIVE
Nitrite, UA: NEGATIVE
POC,PROTEIN,UA: NEGATIVE

## 2018-05-13 MED ORDER — INSULIN GLARGINE 100 UNIT/ML ~~LOC~~ SOLN: 20.0000 [IU] | Freq: Every day | SUBCUTANEOUS | 11 refills | Status: DC

## 2018-05-13 MED ORDER — DOXYLAMINE-PYRIDOXINE 10-10 MG PO TBEC: DELAYED_RELEASE_TABLET | ORAL | 6 refills | Status: DC

## 2018-05-13 NOTE — Progress Notes (Signed)
HIGH-RISK PREGNANCY VISIT Patient name: Miranda [name] [place] MRN [id number]  Date of birth: 1980-10-24 Chief Complaint:   High Risk Gestation  History of Present Illness:   Miranda [name] Miranda [name] is a 37 y.o. 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  Today she reports has appt w/ dietician next Tues 8/13. FBS 126-149, 2hr PP 101-194 (only has 7, 5 of those are elevated). Has not started Lantus yet, states she was told to wait until after appt w/ dietician. Does still eat a lot of foods w/ sugar, mainly drinks water, sometimes Gatorade zero. Did increase synthroid to 150mcg as directed. Finished macrobid for UTI. Reports nausea, trouble sleeping- wants nausea meds.  Contractions: Not present.  .  Movement: Absent. denies leaking of fluid.  Review of Systems:   Pertinent items are noted in HPI Denies abnormal vaginal discharge w/ itching/odor/irritation, headaches, visual changes, shortness of breath, chest pain, abdominal pain, severe nausea/vomiting, or problems with urination or bowel movements unless otherwise stated above. Pertinent History Reviewed:  Reviewed past medical,surgical, social, obstetrical and family history.  Reviewed problem list, medications and allergies. Physical Assessment:   Vitals:   05/13/18 1431  BP: 110/73  Pulse: 92  Weight: 219 lb 6.4 oz (99.5 kg)  Body mass index is 33.36 kg/m.           Physical Examination:   General appearance: alert, well appearing, and in no distress  Mental status: alert, oriented to person, place, and time  Skin: warm & dry   Extremities: Edema: None    Cardiovascular: normal heart rate noted  Respiratory: normal respiratory effort, no distress  Abdomen: gravid,  soft, non-tender  Pelvic: Cervical exam deferred         Fetal Status: Fetal Heart Rate (bpm): +u/[name], informal transabdominal u/[name], was unable to get w/ doppler   Movement: Absent    Fetal Surveillance Testing today: doppler, informal u/[name]   Results for orders placed or performed in visit on 05/13/18 (from the past 24 hour([name]))  POC Urinalysis Dipstick OB   Collection Time: 05/13/18  2:39 PM  Result Value Ref Range   Color, UA     Clarity, UA     Glucose, UA Small (1+) (A) (none)   Bilirubin, UA     Ketones, UA neg    Spec Grav, UA  1.010 - 1.025   Blood, UA neg    pH, UA  5.0 - 8.0   POC Protein UA Negative Negative, Trace   Urobilinogen, UA  0.2 or 1.0 E.U./dL   Nitrite, UA neg    Leukocytes, UA Negative Negative   Appearance     Odor      Assessment & Plan:  1) High-risk pregnancy G2P1001 at 6451w0d with an Estimated Date of Delivery: 11/25/18   2) Uncontrolled ClassBDM, unstable, continue metformin 1,000mg  BID, start Lantus 20u qhs tonight (resent Lantus today- pt states wasn't at her pharmacy when she called today). Check CBGs QID, discussed goals. Decrease carbs/increase protein. Keep appt w/ dietician next Tues. Discussed importance of strict glycemic control during pregnancy. Start baby ASA 81mg  daily today.   3) Hypothyroidism, continue synthroid 150mcg daily, recheck TSH next week  4) Recent UTI> send urine for POC cx today  5) Depression> not taking Lexapro as rx'd,  didn't make appt w/ BH, feeling ok  6) Nausea> rx diclegis  Meds:  Meds ordered this encounter  Medications  . insulin glargine (LANTUS) 100 UNIT/ML injection    Sig: Inject 0.2 mLs (20 Units total) into the skin at bedtime.    Dispense:  10 mL    Refill:  11    Order Specific Question:   Supervising Provider    Answer:   Despina Hidden, LUTHER H [[id number]]  . Doxylamine-Pyridoxine (DICLEGIS) 10-10 MG TBEC    Sig: 2 tabs q hs, if sx persist add 1 tab q am on day 3, if sx persist add 1 tab q afternoon on day 4     Dispense:  100 tablet    Refill:  6    Order Specific Question:   Supervising Provider    Answer:   Duane Lope H [[id number]]   Labs/procedures today: urine cx  Treatment Plan:  Growth u/[name] @ 20, 24, 27, 30, 33, 36, 38wks     Fetal echo 24-28wks   BPP weekly @ 28wks then 2x/wk testing @ 32wks or weekly BPP     Deliver PRN if remains uncontrolled   Reviewed: Preterm labor symptoms and general obstetric precautions including but not limited to vaginal bleeding, contractions, leaking of fluid and fetal movement were reviewed in detail with the patient.  All questions were answered.  Follow-up: Return for As scheduled 8/12 for 1st IT/NT and HROB.  Orders Placed This Encounter  Procedures  . Urine Culture  . POC Urinalysis Dipstick OB   Cheral Marker CNM, [facility] 05/13/2018 5:19 PM

## 2018-05-13 NOTE — Patient Instructions (Addendum)
Check blood sugars 4 times a day: in the morning before eating/drinking anything (<95) and 2 hours after eating breakfast, lunch, and supper (<120).    Begin taking a 81mg  baby aspirin daily to decrease risk of preeclampsia during pregnancy    Miranda [name], I greatly value your feedback.  If you receive a survey following your visit with [place] today, we appreciate you taking the time to fill it out.  Thanks, Joellyn Haff, CNM, WHNP-BC   First Trimester of Pregnancy The first trimester of pregnancy is from week 1 until the end of week 13 (months 1 through 3). A week after a sperm fertilizes an egg, the egg will implant on the wall of the uterus. This embryo will begin to develop into a baby. Genes from you and your partner will form the baby. The female genes will determine whether the baby will be a boy or a girl. At 6-8 weeks, the eyes and face will be formed, and the heartbeat can be seen on ultrasound. At the end of 12 weeks, all the baby's organs will be formed. Now that you are pregnant, you will want to do everything you can to have a healthy baby. Two of the most important things are to get good prenatal care and to follow your health care provider's instructions. Prenatal care is all the medical care you receive before the baby's birth. This care will help prevent, find, and treat any problems during the pregnancy and childbirth. Body changes during your first trimester Your body goes through many changes during pregnancy. The changes vary from woman to woman.  You may gain or lose a couple of pounds at first.  You may feel sick to your stomach (nauseous) and you may throw up (vomit). If the vomiting is uncontrollable, call your health care provider.  You may tire easily.  You may develop headaches that can be relieved by medicines. All medicines should be approved by your health care provider.  You may urinate more often. Painful urination may mean you have a bladder infection.  You may  develop heartburn as a result of your pregnancy.  You may develop constipation because certain hormones are causing the muscles that push stool through your intestines to slow down.  You may develop hemorrhoids or swollen veins (varicose veins).  Your breasts may begin to grow larger and become tender. Your nipples may stick out more, and the tissue that surrounds them (areola) may become darker.  Your gums may bleed and may be sensitive to brushing and flossing.  Dark spots or blotches (chloasma, mask of pregnancy) may develop on your face. This will likely fade after the baby is born.  Your menstrual periods will stop.  You may have a loss of appetite.  You may develop cravings for certain kinds of food.  You may have changes in your emotions from day to day, such as being excited to be pregnant or being concerned that something may go wrong with the pregnancy and baby.  You may have more vivid and strange dreams.  You may have changes in your hair. These can include thickening of your hair, rapid growth, and changes in texture. Some women also have hair loss during or after pregnancy, or hair that feels dry or thin. Your hair will most likely return to normal after your baby is born.  What to expect at prenatal visits During a routine prenatal visit:  You will be weighed to make sure you and the baby are  growing normally.  Your blood pressure will be taken.  Your abdomen will be measured to track your baby's growth.  The fetal heartbeat will be listened to between weeks 10 and 14 of your pregnancy.  Test results from any previous visits will be discussed.  Your health care provider may ask you:  How you are feeling.  If you are feeling the baby move.  If you have had any abnormal symptoms, such as leaking fluid, bleeding, severe headaches, or abdominal cramping.  If you are using any tobacco products, including cigarettes, chewing tobacco, and electronic  cigarettes.  If you have any questions.  Other tests that may be performed during your first trimester include:  Blood tests to find your blood type and to check for the presence of any previous infections. The tests will also be used to check for low iron levels (anemia) and protein on red blood cells (Rh antibodies). Depending on your risk factors, or if you previously had diabetes during pregnancy, you may have tests to check for high blood sugar that affects pregnant women (gestational diabetes).  Urine tests to check for infections, diabetes, or protein in the urine.  An ultrasound to confirm the proper growth and development of the baby.  Fetal screens for spinal cord problems (spina bifida) and Down syndrome.  HIV (human immunodeficiency virus) testing. Routine prenatal testing includes screening for HIV, unless you choose not to have this test.  You may need other tests to make sure you and the baby are doing well.  Follow these instructions at home: Medicines  Follow your health care provider's instructions regarding medicine use. Specific medicines may be either safe or unsafe to take during pregnancy.  Take a prenatal vitamin that contains at least 600 micrograms (mcg) of folic acid.  If you develop constipation, try taking a stool softener if your health care provider approves. Eating and drinking  Eat a balanced diet that includes fresh fruits and vegetables, whole grains, good sources of protein such as meat, eggs, or tofu, and low-fat dairy. Your health care provider will help you determine the amount of weight gain that is right for you.  Avoid raw meat and uncooked cheese. These carry germs that can cause birth defects in the baby.  Eating four or five small meals rather than three large meals a day may help relieve nausea and vomiting. If you start to feel nauseous, eating a few soda crackers can be helpful. Drinking liquids between meals, instead of during meals,  also seems to help ease nausea and vomiting.  Limit foods that are high in fat and processed sugars, such as fried and sweet foods.  To prevent constipation: ? Eat foods that are high in fiber, such as fresh fruits and vegetables, whole grains, and beans. ? Drink enough fluid to keep your urine clear or pale yellow. Activity  Exercise only as directed by your health care provider. Most women can continue their usual exercise routine during pregnancy. Try to exercise for 30 minutes at least 5 days a week. Exercising will help you: ? Control your weight. ? Stay in shape. ? Be prepared for labor and delivery.  Experiencing pain or cramping in the lower abdomen or lower back is a good sign that you should stop exercising. Check with your health care provider before continuing with normal exercises.  Try to avoid standing for long periods of time. Move your legs often if you must stand in one place for a long time.  Avoid  heavy lifting.  Wear low-heeled shoes and practice good posture.  You may continue to have sex unless your health care provider tells you not to. Relieving pain and discomfort  Wear a good support bra to relieve breast tenderness.  Take warm sitz baths to soothe any pain or discomfort caused by hemorrhoids. Use hemorrhoid cream if your health care provider approves.  Rest with your legs elevated if you have leg cramps or low back pain.  If you develop varicose veins in your legs, wear support hose. Elevate your feet for 15 minutes, 3-4 times a day. Limit salt in your diet. Prenatal care  Schedule your prenatal visits by the twelfth week of pregnancy. They are usually scheduled monthly at first, then more often in the last 2 months before delivery.  Write down your questions. Take them to your prenatal visits.  Keep all your prenatal visits as told by your health care provider. This is important. Safety  Wear your seat belt at all times when driving.  Make a  list of emergency phone numbers, including numbers for family, friends, the hospital, and police and fire departments. General instructions  Ask your health care provider for a referral to a local prenatal education class. Begin classes no later than the beginning of month 6 of your pregnancy.  Ask for help if you have counseling or nutritional needs during pregnancy. Your health care provider can offer advice or refer you to specialists for help with various needs.  Do not use hot tubs, steam rooms, or saunas.  Do not douche or use tampons or scented sanitary pads.  Do not cross your legs for long periods of time.  Avoid cat litter boxes and soil used by cats. These carry germs that can cause birth defects in the baby and possibly loss of the fetus by miscarriage or stillbirth.  Avoid all smoking, herbs, alcohol, and medicines not prescribed by your health care provider. Chemicals in these products affect the formation and growth of the baby.  Do not use any products that contain nicotine or tobacco, such as cigarettes and e-cigarettes. If you need help quitting, ask your health care provider. You may receive counseling support and other resources to help you quit.  Schedule a dentist appointment. At home, brush your teeth with a soft toothbrush and be gentle when you floss. Contact a health care provider if:  You have dizziness.  You have mild pelvic cramps, pelvic pressure, or nagging pain in the abdominal area.  You have persistent nausea, vomiting, or diarrhea.  You have a bad smelling vaginal discharge.  You have pain when you urinate.  You notice increased swelling in your face, hands, legs, or ankles.  You are exposed to fifth disease or chickenpox.  You are exposed to [place] measles (rubella) and have never had it. Get help right away if:  You have a fever.  You are leaking fluid from your vagina.  You have spotting or bleeding from your vagina.  You have severe  abdominal cramping or pain.  You have rapid weight gain or loss.  You vomit blood or material that looks like coffee grounds.  You develop a severe headache.  You have shortness of breath.  You have any kind of trauma, such as from a fall or a car accident. Summary  The first trimester of pregnancy is from week 1 until the end of week 13 (months 1 through 3).  Your body goes through many changes during pregnancy. The changes vary  from woman to woman.  You will have routine prenatal visits. During those visits, your health care provider will examine you, discuss any test results you may have, and talk with you about how you are feeling. This information is not intended to replace advice given to you by your health care provider. Make sure you discuss any questions you have with your health care provider. Document Released: 09/18/2001 Document Revised: 09/05/2016 Document Reviewed: 09/05/2016 Elsevier Interactive Patient Education  2017 [employer].

## 2018-05-15 LAB — URINE CULTURE

## 2018-05-19 ENCOUNTER — Other Ambulatory Visit: Payer: Self-pay

## 2018-05-19 ENCOUNTER — Ambulatory Visit (INDEPENDENT_AMBULATORY_CARE_PROVIDER_SITE_OTHER): Payer: Medicaid Other | Admitting: Obstetrics & Gynecology

## 2018-05-19 ENCOUNTER — Ambulatory Visit (INDEPENDENT_AMBULATORY_CARE_PROVIDER_SITE_OTHER): Payer: Medicaid Other

## 2018-05-19 ENCOUNTER — Encounter: Payer: Self-pay | Admitting: Obstetrics & Gynecology

## 2018-05-19 VITALS — BP 127/82 | HR 89 | Wt 222.0 lb

## 2018-05-19 DIAGNOSIS — E039 Hypothyroidism, unspecified: Secondary | ICD-10-CM

## 2018-05-19 DIAGNOSIS — O0990 Supervision of high risk pregnancy, unspecified, unspecified trimester: Secondary | ICD-10-CM

## 2018-05-19 DIAGNOSIS — Z3682 Encounter for antenatal screening for nuchal translucency: Secondary | ICD-10-CM

## 2018-05-19 DIAGNOSIS — E118 Type 2 diabetes mellitus with unspecified complications: Secondary | ICD-10-CM

## 2018-05-19 DIAGNOSIS — Z1389 Encounter for screening for other disorder: Secondary | ICD-10-CM

## 2018-05-19 DIAGNOSIS — O99281 Endocrine, nutritional and metabolic diseases complicating pregnancy, first trimester: Secondary | ICD-10-CM

## 2018-05-19 DIAGNOSIS — O0991 Supervision of high risk pregnancy, unspecified, first trimester: Secondary | ICD-10-CM

## 2018-05-19 DIAGNOSIS — O24111 Pre-existing diabetes mellitus, type 2, in pregnancy, first trimester: Secondary | ICD-10-CM

## 2018-05-19 DIAGNOSIS — Z3A12 12 weeks gestation of pregnancy: Secondary | ICD-10-CM

## 2018-05-19 DIAGNOSIS — Z331 Pregnant state, incidental: Secondary | ICD-10-CM

## 2018-05-19 NOTE — Progress Notes (Signed)
   HIGH-RISK PREGNANCY VISIT Patient name: Miranda [name] [id number]  Date of birth: May 06, 1981 Chief Complaint:   High Risk Gestation (NT today)  History of Present Illness:   Miranda [name] is a 37 y.o. 92P1001 female at 9715w6d with an Estimated Date of Delivery: 11/25/18 being seen today for ongoing management of a high-risk pregnancy complicated by class  B DM.  Today she reports CBG are better, not perfect.  . Vag. Bleeding: None.   . denies leaking of fluid.  Review of Systems:   Pertinent items are noted in HPI Denies abnormal vaginal discharge w/ itching/odor/irritation, headaches, visual changes, shortness of breath, chest pain, abdominal pain, severe nausea/vomiting, or problems with urination or bowel movements unless otherwise stated above. Pertinent History Reviewed:  Reviewed past medical,surgical, social, obstetrical and family history.  Reviewed problem list, medications and allergies. Physical Assessment:   Vitals:   05/19/18 0954  BP: 127/82  Pulse: 89  Weight: 222 lb (100.7 kg)  Body mass index is 33.75 kg/m.           Physical Examination:   General appearance: alert, well appearing, and in no distress  Mental status: alert, oriented to person, place, and time  Skin: warm & dry   Extremities: Edema: None    Cardiovascular: normal heart rate noted  Respiratory: normal respiratory effort, no distress  Abdomen: gravid, soft, non-tender  Pelvic: Cervical exam deferred         Fetal Status:          Fetal Surveillance Testing today: NT normal   No results found for this or any previous visit (from the past 24 hour([name])).  Assessment & Plan:  1) High-risk pregnancy G2P1001 at 8115w6d with an Estimated Date of Delivery: 11/25/18   2) Class B DM, unstable, continue metformin 1000 mg BID, increase lantus to 26 units at bedtime  3) Hypothyroidism, stable, recheck TSH at next visit  Meds: No orders of the defined types were placed in this  encounter.   Labs/procedures today: sonogram NT  Treatment Plan:  IT today, increase lantus to 26 units, follow up 4 weeks  Reviewed: Term labor symptoms and general obstetric precautions including but not limited to vaginal bleeding, contractions, leaking of fluid and fetal movement were reviewed in detail with the patient.  All questions were answered.  Follow-up: Return in about 4 weeks (around 06/16/2018) for 2nd IT, HROB.  Orders Placed This Encounter  Procedures  . Integrated 1  . POC Urinalysis Dipstick OB   Lazaro ArmsLuther H Haig Gerardo  05/19/2018 10:13 AM

## 2018-05-19 NOTE — Progress Notes (Signed)
US 12+6 wks,measurement c/w dates,fhr 159 bpm,crl 66.31 mm,posterior pl gr 0,normal ovaries bilat,NB present,NT 1.8 mm

## 2018-05-20 ENCOUNTER — Other Ambulatory Visit: Payer: Medicaid Other

## 2018-05-21 LAB — INTEGRATED 1
Crown Rump Length: 66.3 mm
Gest. Age on Collection Date: 12.7 weeks
Maternal Age at EDD: 38.5 yr
NUCHAL TRANSLUCENCY (NT): 1.8 mm
Number of Fetuses: 1
PAPP-A VALUE: 775.9 ng/mL
Weight: 222 [lb_av]

## 2018-05-26 ENCOUNTER — Telehealth: Payer: Self-pay | Admitting: Women's Health

## 2018-05-26 NOTE — Telephone Encounter (Signed)
Patient called stating that Dr. Emelda FearFerguson has changed her medication from 2 to 4 tabs, pt states that she has run out and now the pharmacy will not send over a refill request because it is to early. Please contact pt

## 2018-05-27 ENCOUNTER — Telehealth: Payer: Self-pay | Admitting: Obstetrics & Gynecology

## 2018-05-27 MED ORDER — METFORMIN HCL 1000 MG PO TABS: 1000.0000 mg | ORAL_TABLET | Freq: Two times a day (BID) | ORAL | 5 refills | Status: DC

## 2018-05-27 NOTE — Telephone Encounter (Signed)
[facility]

## 2018-05-27 NOTE — Telephone Encounter (Signed)
done

## 2018-05-28 NOTE — Telephone Encounter (Signed)
Meds ordered this encounter  Medications  . metFORMIN (GLUCOPHAGE) 1000 MG tablet    Sig: Take 1 tablet (1,000 mg total) by mouth 2 (two) times daily with a meal.    Dispense:  60 tablet    Refill:  5

## 2018-06-04 ENCOUNTER — Encounter: Payer: Self-pay | Admitting: *Deleted

## 2018-06-06 ENCOUNTER — Ambulatory Visit (INDEPENDENT_AMBULATORY_CARE_PROVIDER_SITE_OTHER): Payer: Medicaid Other | Admitting: Women's Health

## 2018-06-06 ENCOUNTER — Encounter: Payer: Self-pay | Admitting: Women's Health

## 2018-06-06 VITALS — BP 120/79 | HR 89 | Wt 221.0 lb

## 2018-06-06 DIAGNOSIS — O0992 Supervision of high risk pregnancy, unspecified, second trimester: Secondary | ICD-10-CM | POA: Diagnosis not present

## 2018-06-06 DIAGNOSIS — N898 Other specified noninflammatory disorders of vagina: Secondary | ICD-10-CM | POA: Diagnosis not present

## 2018-06-06 DIAGNOSIS — O0990 Supervision of high risk pregnancy, unspecified, unspecified trimester: Secondary | ICD-10-CM

## 2018-06-06 DIAGNOSIS — Z3A15 15 weeks gestation of pregnancy: Secondary | ICD-10-CM

## 2018-06-06 DIAGNOSIS — Z1379 Encounter for other screening for genetic and chromosomal anomalies: Secondary | ICD-10-CM | POA: Diagnosis not present

## 2018-06-06 DIAGNOSIS — O24112 Pre-existing diabetes mellitus, type 2, in pregnancy, second trimester: Secondary | ICD-10-CM

## 2018-06-06 DIAGNOSIS — O24119 Pre-existing diabetes mellitus, type 2, in pregnancy, unspecified trimester: Secondary | ICD-10-CM

## 2018-06-06 DIAGNOSIS — Z331 Pregnant state, incidental: Secondary | ICD-10-CM

## 2018-06-06 DIAGNOSIS — E039 Hypothyroidism, unspecified: Secondary | ICD-10-CM | POA: Diagnosis not present

## 2018-06-06 DIAGNOSIS — O26892 Other specified pregnancy related conditions, second trimester: Secondary | ICD-10-CM | POA: Diagnosis not present

## 2018-06-06 DIAGNOSIS — Z1389 Encounter for screening for other disorder: Secondary | ICD-10-CM

## 2018-06-06 LAB — POCT URINALYSIS DIPSTICK OB
Blood, UA: NEGATIVE
GLUCOSE, UA: NEGATIVE
Ketones, UA: NEGATIVE
LEUKOCYTES UA: NEGATIVE
Nitrite, UA: NEGATIVE
POC,PROTEIN,UA: NEGATIVE

## 2018-06-06 LAB — POCT WET PREP (WET MOUNT)
Clue Cells Wet Prep Whiff POC: NEGATIVE
Trichomonas Wet Prep HPF POC: ABSENT

## 2018-06-06 MED ORDER — INSULIN ASPART 100 UNIT/ML ~~LOC~~ SOLN: 4.0000 [IU] | Freq: Three times a day (TID) | SUBCUTANEOUS | 12 refills | Status: DC

## 2018-06-06 MED ORDER — SULFAMETHOXAZOLE-TRIMETHOPRIM 800-160 MG PO TABS: 1.0000 | ORAL_TABLET | Freq: Two times a day (BID) | ORAL | 0 refills | Status: DC

## 2018-06-06 NOTE — Patient Instructions (Addendum)
Miranda [name], I greatly value your feedback.  If you receive a survey following your visit with us today, we appreciate you taking the time to fill it out.  Thanks, Joellyn HaffKim Tangala Wiegert, CNM, WHNP-BC  Keep taking metformin 1,000mg  twice daily, Lantus 26 units at night Add Novolog 4 units right before each meal (breakfast/lunch/supper)  Check blood sugars 4 times a day: in the morning before eating/drinking anything (<95) and 2 hours after eating breakfast, lunch, and supper (<120).     Tips to Help You Sleep Better:   Get into a bedtime routine, try to do the same thing every night before going to bed to try to help your body wind down  Warm baths  Avoid caffeine for at least 3 hours before going to sleep   Keep your room at a slightly cooler temperature, can try running a fan  Turn off TV, lights, phone, electronics  Lots of pillows if needed to help you get comfortable  Lavender scented items can help you sleep. You can place lavender essential oil on a cotton ball and place under your pillowcase, or place in a diffuser. [employer] has a lavender scented sleep line (plug-ins, sprays, etc). Look in the pillow aisle for lavender scented pillows.   If none of the above things help, you can try 1/2 to 1 tablet of benadryl, unisom, or tylenol pm. Do not take this every night, only when you really need it.    Second Trimester of Pregnancy The second trimester is from week 14 through week 27 (months 4 through 6). The second trimester is often a time when you feel your best. Your body has adjusted to being pregnant, and you begin to feel better physically. Usually, morning sickness has lessened or quit completely, you may have more energy, and you may have an increase in appetite. The second trimester is also a time when the fetus is growing rapidly. At the end of the sixth month, the fetus is about 9 inches long and weighs about 1 pounds. You will likely begin to feel the baby move (quickening) between  16 and 20 weeks of pregnancy. Body changes during your second trimester Your body continues to go through many changes during your second trimester. The changes vary from woman to woman.  Your weight will continue to increase. You will notice your lower abdomen bulging out.  You may begin to get stretch marks on your hips, abdomen, and breasts.  You may develop headaches that can be relieved by medicines. The medicines should be approved by your health care provider.  You may urinate more often because the fetus is pressing on your bladder.  You may develop or continue to have heartburn as a result of your pregnancy.  You may develop constipation because certain hormones are causing the muscles that push waste through your intestines to slow down.  You may develop hemorrhoids or swollen, bulging veins (varicose veins).  You may have back pain. This is caused by: ? Weight gain. ? Pregnancy hormones that are relaxing the joints in your pelvis. ? A shift in weight and the muscles that support your balance.  Your breasts will continue to grow and they will continue to become tender.  Your gums may bleed and may be sensitive to brushing and flossing.  Dark spots or blotches (chloasma, mask of pregnancy) may develop on your face. This will likely fade after the baby is born.  A dark line from your belly button to the pubic area (  linea nigra) may appear. This will likely fade after the baby is born.  You may have changes in your hair. These can include thickening of your hair, rapid growth, and changes in texture. Some women also have hair loss during or after pregnancy, or hair that feels dry or thin. Your hair will most likely return to normal after your baby is born.  What to expect at prenatal visits During a routine prenatal visit:  You will be weighed to make sure you and the fetus are growing normally.  Your blood pressure will be taken.  Your abdomen will be measured to track  your baby's growth.  The fetal heartbeat will be listened to.  Any test results from the previous visit will be discussed.  Your health care provider may ask you:  How you are feeling.  If you are feeling the baby move.  If you have had any abnormal symptoms, such as leaking fluid, bleeding, severe headaches, or abdominal cramping.  If you are using any tobacco products, including cigarettes, chewing tobacco, and electronic cigarettes.  If you have any questions.  Other tests that may be performed during your second trimester include:  Blood tests that check for: ? Low iron levels (anemia). ? High blood sugar that affects pregnant women (gestational diabetes) between 59 and 28 weeks. ? Rh antibodies. This is to check for a protein on red blood cells (Rh factor).  Urine tests to check for infections, diabetes, or protein in the urine.  An ultrasound to confirm the proper growth and development of the baby.  An amniocentesis to check for possible genetic problems.  Fetal screens for spina bifida and Down syndrome.  HIV (human immunodeficiency virus) testing. Routine prenatal testing includes screening for HIV, unless you choose not to have this test.  Follow these instructions at home: Medicines  Follow your health care provider's instructions regarding medicine use. Specific medicines may be either safe or unsafe to take during pregnancy.  Take a prenatal vitamin that contains at least 600 micrograms (mcg) of folic acid.  If you develop constipation, try taking a stool softener if your health care provider approves. Eating and drinking  Eat a balanced diet that includes fresh fruits and vegetables, whole grains, good sources of protein such as meat, eggs, or tofu, and low-fat dairy. Your health care provider will help you determine the amount of weight gain that is right for you.  Avoid raw meat and uncooked cheese. These carry germs that can cause birth defects in the  baby.  If you have low calcium intake from food, talk to your health care provider about whether you should take a daily calcium supplement.   Limit foods that are high in fat and processed sugars, such as fried and sweet foods.  To prevent constipation: ? Drink enough fluid to keep your urine clear or pale yellow. ? Eat foods that are high in fiber, such as fresh fruits and vegetables, whole grains, and beans. Activity  Exercise only as directed by your health care provider. Most women can continue their usual exercise routine during pregnancy. Try to exercise for 30 minutes at least 5 days a week. Stop exercising if you experience uterine contractions.  Avoid heavy lifting, wear low heel shoes, and practice good posture.  A sexual relationship may be continued unless your health care provider directs you otherwise. Relieving pain and discomfort  Wear a good support bra to prevent discomfort from breast tenderness.  Take warm sitz baths to soothe  any pain or discomfort caused by hemorrhoids. Use hemorrhoid cream if your health care provider approves.  Rest with your legs elevated if you have leg cramps or low back pain.  If you develop varicose veins, wear support hose. Elevate your feet for 15 minutes, 3-4 times a day. Limit salt in your diet. Prenatal Care  Write down your questions. Take them to your prenatal visits.  Keep all your prenatal visits as told by your health care provider. This is important. Safety  Wear your seat belt at all times when driving.  Make a list of emergency phone numbers, including numbers for family, friends, the hospital, and police and fire departments. General instructions  Ask your health care provider for a referral to a local prenatal education class. Begin classes no later than the beginning of month 6 of your pregnancy.  Ask for help if you have counseling or nutritional needs during pregnancy. Your health care provider can offer advice or  refer you to specialists for help with various needs.  Do not use hot tubs, steam rooms, or saunas.  Do not douche or use tampons or scented sanitary pads.  Do not cross your legs for long periods of time.  Avoid cat litter boxes and soil used by cats. These carry germs that can cause birth defects in the baby and possibly loss of the fetus by miscarriage or stillbirth.  Avoid all smoking, herbs, alcohol, and unprescribed drugs. Chemicals in these products can affect the formation and growth of the baby.  Do not use any products that contain nicotine or tobacco, such as cigarettes and e-cigarettes. If you need help quitting, ask your health care provider.  Visit your dentist if you have not gone yet during your pregnancy. Use a soft toothbrush to brush your teeth and be gentle when you floss. Contact a health care provider if:  You have dizziness.  You have mild pelvic cramps, pelvic pressure, or nagging pain in the abdominal area.  You have persistent nausea, vomiting, or diarrhea.  You have a bad smelling vaginal discharge.  You have pain when you urinate. Get help right away if:  You have a fever.  You are leaking fluid from your vagina.  You have spotting or bleeding from your vagina.  You have severe abdominal cramping or pain.  You have rapid weight gain or weight loss.  You have shortness of breath with chest pain.  You notice sudden or extreme swelling of your face, hands, ankles, feet, or legs.  You have not felt your baby move in over an hour.  You have severe headaches that do not go away when you take medicine.  You have vision changes. Summary  The second trimester is from week 14 through week 27 (months 4 through 6). It is also a time when the fetus is growing rapidly.  Your body goes through many changes during pregnancy. The changes vary from woman to woman.  Avoid all smoking, herbs, alcohol, and unprescribed drugs. These chemicals affect the  formation and growth your baby.  Do not use any tobacco products, such as cigarettes, chewing tobacco, and e-cigarettes. If you need help quitting, ask your health care provider.  Contact your health care provider if you have any questions. Keep all prenatal visits as told by your health care provider. This is important. This information is not intended to replace advice given to you by your health care provider. Make sure you discuss any questions you have with your health care  provider. Document Released: 09/18/2001 Document Revised: 03/01/2016 Document Reviewed: 11/25/2012 Elsevier Interactive Patient Education  2017 [employer].

## 2018-06-06 NOTE — Progress Notes (Signed)
Clear vaginal discharge with no odor.  

## 2018-06-06 NOTE — Progress Notes (Signed)
HIGH-RISK PREGNANCY VISIT Patient name: Miranda [name] MRN [id number]  Date of birth: 1980-12-30 Chief Complaint:   High Risk Gestation (2nd IT; body breaking out; clear vaginal discharge)  History of Present Illness:   Miranda [name] is a 37 y.o. 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  Today she reports fbs 79-115 (5>120), 2hr pp 98-232 (13 >120). Appt w/ dietician 10/10. Eating things she shouldn't. Not checking sugars consistently QID 'forgets' some. Bumps on body. Clear discharge, no itching/odor/irritation.  Contractions: Not present. Vag. Bleeding: None.  Movement: Absent. denies leaking of fluid.  Review of Systems:   Pertinent items are noted in HPI Denies abnormal vaginal discharge w/ itching/odor/irritation, headaches, visual changes, shortness of breath, chest pain, abdominal pain, severe nausea/vomiting, or problems with urination or bowel movements unless otherwise stated above. Pertinent History Reviewed:  Reviewed past medical,surgical, social, obstetrical and family history.  Reviewed problem list, medications and allergies. Physical Assessment:   Vitals:   06/06/18 1107  BP: 120/79  Pulse: 89  Weight: 221 lb (100.2 kg)  Body mass index is 33.6 kg/m.           Physical Examination:   General appearance: alert, well appearing, and in no distress  Mental status: alert, oriented to person, place, and time  Skin: warm & dry   Extremities: Edema: None    Cardiovascular: normal heart rate noted  Respiratory: normal respiratory effort, no distress  Abdomen: gravid, soft, non-tender  Pelvic: spec exam cx visually closed, small amt white nonodorous d/c, wet prep neg       Boil Rt inner thigh  Fetal Status: Fetal Heart Rate (bpm):  150   Movement: Absent    Fetal Surveillance Testing today: doppler   Results for orders placed or performed in visit on 06/06/18 (from the past 24 hour(s))  POC Urinalysis Dipstick OB   Collection Time: 06/06/18 11:10 AM  Result Value Ref Range   Color, UA     Clarity, UA     Glucose, UA Negative Negative   Bilirubin, UA     Ketones, UA neg    Spec Grav, UA     Blood, UA neg    pH, UA     POC Protein UA Negative Negative, Trace   Urobilinogen, UA     Nitrite, UA neg    Leukocytes, UA Negative Negative   Appearance     Odor    POCT Wet Prep Mellody Drown([place])   Collection Time: 06/06/18 11:51 AM  Result Value Ref Range   Source Wet Prep POC vaginal    WBC, Wet Prep HPF POC no    Bacteria Wet Prep HPF POC Few Few   BACTERIA WET PREP MORPHOLOGY POC     Clue Cells Wet Prep HPF POC None None   Clue Cells Wet Prep Whiff POC Negative Whiff    Yeast Wet Prep HPF POC None None   KOH Wet Prep POC     Trichomonas Wet Prep HPF POC Absent Absent    Assessment & Plan:  1) High-risk pregnancy G2P1001 at 1947w3d with an Estimated Date of Delivery: 11/25/18   2) ClassBDM, unstable, discussed w/ JVF, continue metformin 1,000mg  BID, Lantus 26u qhs, add Novolog 4u w/ each meal. Keep appt w/ dietician 10/10.  Discussed importance of compliance w/ diet.   3) Hypothyroidism, check TSH today, on synthroid daily  4) Dep/anx- no meds  5) Boil Rt thigh> rx bactrim ds bid x 7d  6) AMA  Meds:  Meds ordered this encounter  Medications  . sulfamethoxazole-trimethoprim (BACTRIM DS,SEPTRA DS) 800-160 MG tablet    Sig: Take 1 tablet by mouth 2 (two) times daily. X 7 days    Dispense:  14 tablet    Refill:  0    Order Specific Question:   Supervising Provider    Answer:   Despina Hidden, LUTHER H [[id number]]  . insulin aspart (NOVOLOG) 100 UNIT/ML injection    Sig: Inject 4 Units into the skin 3 (three) times daily before meals.    Dispense:  10 mL    Refill:  12    Order Specific Question:   Supervising  Provider    Answer:   Duane Lope H [[id number]]    Labs/procedures today: spec exam, wet prep  Treatment Plan:  Growth u/s @ 20, 24, 27, 30, 33, 36, 38wks     Fetal echo 24-28wks:____   BPP weekly @ 28wks then 2x/wk testing @ 32wks or weekly BPP     Deliver PRN:_____   Reviewed: Preterm labor symptoms and general obstetric precautions including but not limited to vaginal bleeding, contractions, leaking of fluid and fetal movement were reviewed in detail with the patient.  All questions were answered.  Follow-up: Return for As scheduled.  Orders Placed This Encounter  Procedures  . INTEGRATED 2  . TSH  . POC Urinalysis Dipstick OB  . POCT Wet Prep [facility])   Cheral Marker CNM, [facility] 06/06/2018 11:55 AM

## 2018-06-07 LAB — TSH: TSH: 0.805 u[IU]/mL (ref 0.450–4.500)

## 2018-06-11 LAB — INTEGRATED 2
AFP MARKER: 21.4 ng/mL
AFP MoM: 1.31
CROWN RUMP LENGTH: 66.3 mm
DIA MOM: 1.05
DIA VALUE: 153.4 pg/mL
Estriol, Unconjugated: 0.65 ng/mL
GESTATIONAL AGE: 15.3 wk
Gest. Age on Collection Date: 12.7 weeks
Maternal Age at EDD: 38.5 yr
NUCHAL TRANSLUCENCY (NT): 1.8 mm
NUCHAL TRANSLUCENCY MOM: 1.21
Number of Fetuses: 1
PAPP-A MOM: 1.25
PAPP-A VALUE: 775.9 ng/mL
TEST RESULTS: NEGATIVE
Weight: 221 [lb_av]
Weight: 222 [lb_av]
hCG MoM: 0.79
hCG Value: 28.1 IU/mL
uE3 MoM: 1.04

## 2018-06-16 ENCOUNTER — Encounter: Payer: Medicaid Other | Admitting: Women's Health

## 2018-06-17 ENCOUNTER — Ambulatory Visit (INDEPENDENT_AMBULATORY_CARE_PROVIDER_SITE_OTHER): Payer: Medicaid Other | Admitting: Obstetrics & Gynecology

## 2018-06-17 VITALS — BP 135/76 | HR 84 | Wt 226.0 lb

## 2018-06-17 DIAGNOSIS — O24319 Unspecified pre-existing diabetes mellitus in pregnancy, unspecified trimester: Secondary | ICD-10-CM

## 2018-06-17 DIAGNOSIS — O0992 Supervision of high risk pregnancy, unspecified, second trimester: Secondary | ICD-10-CM

## 2018-06-17 DIAGNOSIS — O24312 Unspecified pre-existing diabetes mellitus in pregnancy, second trimester: Secondary | ICD-10-CM

## 2018-06-17 DIAGNOSIS — Z3A17 17 weeks gestation of pregnancy: Secondary | ICD-10-CM

## 2018-06-17 MED ORDER — INSULIN ASPART 100 UNIT/ML ~~LOC~~ SOLN: 10.0000 [IU] | Freq: Two times a day (BID) | SUBCUTANEOUS | 12 refills | Status: DC

## 2018-06-17 MED ORDER — INSULIN NPH (HUMAN) (ISOPHANE) 100 UNIT/ML ~~LOC~~ SUSP: 20.0000 [IU] | Freq: Every day | SUBCUTANEOUS | 3 refills | Status: DC

## 2018-06-17 NOTE — Progress Notes (Signed)
Patient ID: Miranda [name], female   DOB: 1981-03-08, 37 y.o.   MRN: [id number]   HIGH-RISK PREGNANCY VISIT Patient name: Miranda [name] MRN [id number]  Date of birth: 10/01/1981 Chief Complaint:   Routine Prenatal Visit  History of Present Illness:   Miranda [name] is a 37 y.o. G32P1001 female at [redacted]w[redacted]d with an Estimated Date of Delivery: 11/25/18 being seen today for ongoing management of a high-risk pregnancy complicated by class  B DM.  Today she reports CBG suboptimal control. Contractions: Not present. Vag. Bleeding: None.  Movement: Absent. denies leaking of fluid.  Review of Systems:   Pertinent items are noted in HPI Denies abnormal vaginal discharge w/ itching/odor/irritation, headaches, visual changes, shortness of breath, chest pain, abdominal pain, severe nausea/vomiting, or problems with urination or bowel movements unless otherwise stated above. Pertinent History Reviewed:  Reviewed past medical,surgical, social, obstetrical and family history.  Reviewed problem list, medications and allergies. Physical Assessment:   Vitals:   06/17/18 1119  BP: 135/76  Pulse: 84  Weight: 226 lb (102.5 kg)  Body mass index is 34.36 kg/m.           Physical Examination:   General appearance: alert, well appearing, and in no distress  Mental status: alert, oriented to person, place, and time  Skin: warm & dry   Extremities: Edema: None    Cardiovascular: normal heart rate noted  Respiratory: normal respiratory effort, no distress  Abdomen: gravid, soft, non-tender  Pelvic: Cervical exam deferred         Fetal Status:     Movement: Absent    Fetal Surveillance Testing today: FHR  150   No results found for this or any previous visit (from the past 24 hour(s)).  Assessment & Plan:  1) High-risk pregnancy G2P1001 at [redacted]w[redacted]d with an Estimated Date of Delivery: 11/25/18   2) Class B DM, suboptimal control, switch from lantus + 3 doses novolog to NPH/Novolog split dosing, continue  metformin    Meds:  Meds ordered this encounter  Medications  . insulin aspart (NOVOLOG) 100 UNIT/ML injection    Sig: Inject 10 Units into the skin 2 (two) times daily before a meal. Before breakfast and supper    Dispense:  10 mL    Refill:  12  . insulin NPH Human (HUMULIN N,NOVOLIN N) 100 UNIT/ML injection    Sig: Inject 0.2 mLs (20 Units total) into the skin daily before breakfast. And 10 units or 0.5mls prior to supper    Dispense:  10 mL    Refill:  3    Labs/procedures today:   Treatment Plan:  As above  Reviewed: Preterm labor symptoms and general obstetric precautions including but not limited to vaginal bleeding, contractions, leaking of fluid and fetal movement were reviewed in detail with the patient.  All questions were answered.  Follow-up: Return in about 1 week (around 06/24/2018) for Follow up, with Dr Despina Hidden.  No orders of the defined types were placed in this encounter.  Miranda [name] Miranda [name]  06/17/2018 12:00 PM

## 2018-06-23 ENCOUNTER — Ambulatory Visit (INDEPENDENT_AMBULATORY_CARE_PROVIDER_SITE_OTHER): Payer: Medicaid Other | Admitting: Obstetrics & Gynecology

## 2018-06-23 VITALS — BP 131/77 | HR 91 | Wt 222.0 lb

## 2018-06-23 DIAGNOSIS — Z3A17 17 weeks gestation of pregnancy: Secondary | ICD-10-CM

## 2018-06-23 DIAGNOSIS — O24312 Unspecified pre-existing diabetes mellitus in pregnancy, second trimester: Secondary | ICD-10-CM

## 2018-06-23 DIAGNOSIS — O0992 Supervision of high risk pregnancy, unspecified, second trimester: Secondary | ICD-10-CM

## 2018-06-23 DIAGNOSIS — O24319 Unspecified pre-existing diabetes mellitus in pregnancy, unspecified trimester: Secondary | ICD-10-CM

## 2018-06-23 DIAGNOSIS — Z1389 Encounter for screening for other disorder: Secondary | ICD-10-CM

## 2018-06-23 DIAGNOSIS — Z331 Pregnant state, incidental: Secondary | ICD-10-CM

## 2018-06-23 LAB — POCT URINALYSIS DIPSTICK OB
Blood, UA: NEGATIVE
GLUCOSE, UA: NEGATIVE
Ketones, UA: NEGATIVE
LEUKOCYTES UA: NEGATIVE
Nitrite, UA: NEGATIVE
POC,PROTEIN,UA: NEGATIVE

## 2018-06-23 NOTE — Progress Notes (Signed)
   HIGH-RISK PREGNANCY VISIT Patient name: Miranda [name] MRN [id number]  Date of birth: 1981-05-11 Chief Complaint:   Routine Prenatal Visit  History of Present Illness:   Miranda [name] is a 37 y.o. 482P1001 female at 5288w6d with an Estimated Date of Delivery: 11/25/18 being seen today for ongoing management of a high-risk pregnancy complicated by class  B DM.  Today she reports no complaints. Contractions: Not present. Vag. Bleeding: None.  Movement: Present. denies leaking of fluid.  Review of Systems:   Pertinent items are noted in HPI Denies abnormal vaginal discharge w/ itching/odor/irritation, headaches, visual changes, shortness of breath, chest pain, abdominal pain, severe nausea/vomiting, or problems with urination or bowel movements unless otherwise stated above. Pertinent History Reviewed:  Reviewed past medical,surgical, social, obstetrical and family history.  Reviewed problem list, medications and allergies. Physical Assessment:   Vitals:   06/23/18 1026  BP: 131/77  Pulse: 91  Weight: 222 lb (100.7 kg)  Body mass index is 33.75 kg/m.           Physical Examination:   General appearance: alert, well appearing, and in no distress  Mental status: alert, oriented to person, place, and time  Skin: warm & dry   Extremities: Edema: None    Cardiovascular: normal heart rate noted  Respiratory: normal respiratory effort, no distress  Abdomen: gravid, soft, non-tender  Pelvic: Cervical exam deferred         Fetal Status: Fetal Heart Rate (bpm): 163   Movement: Present    Fetal Surveillance Testing today: CBG are much better   Results for orders placed or performed in visit on 06/23/18 (from the past 24 hour(s))  POC Urinalysis Dipstick OB   Collection Time: 06/23/18 10:29 AM  Result Value Ref Range   Color, UA     Clarity, UA     Glucose, UA Negative Negative   Bilirubin, UA     Ketones, UA neg    Spec Grav, UA     Blood, UA neg    pH, UA     POC Protein UA Negative  Negative, Trace   Urobilinogen, UA     Nitrite, UA neg    Leukocytes, UA Negative Negative   Appearance     Odor      Assessment & Plan:  1) High-risk pregnancy G2P1001 at 2688w6d with an Estimated Date of Delivery: 11/25/18   2) Class B DM, stable, adjusting split dose insulin, AM 26N/14R  PM 16N/14R    Meds: No orders of the defined types were placed in this encounter.   Labs/procedures today:   Treatment Plan:  Anatomy scan 2 weeks   Reviewed: Preterm labor symptoms and general obstetric precautions including but not limited to vaginal bleeding, contractions, leaking of fluid and fetal movement were reviewed in detail with the patient.  All questions were answered.  Follow-up: Return in about 2 weeks (around 07/07/2018) for 20 week sono, HROB, with Dr Despina HiddenEure.  Orders Placed This Encounter  Procedures  . US OB Comp + 14 Wk  . POC Urinalysis Dipstick OB   Lazaro ArmsLuther H Eure  06/23/2018 10:56 AM

## 2018-06-30 ENCOUNTER — Telehealth: Payer: Self-pay | Admitting: Obstetrics & Gynecology

## 2018-06-30 NOTE — Telephone Encounter (Signed)
Patient called stating that she has a cold and would like something for the provider to call in. Pt states she uses [facility] [place].Please contact pt

## 2018-06-30 NOTE — Telephone Encounter (Signed)
Patient called with c/o dry cough since last Tuesday.  She has tried taking Benadryl, cough drops, claritin, with no relief.  Thought she was getting better yesterday, but cough returned last night.  Advised patient to push fluids and try mucinex to help loosen mucous.  If not better by Wednesday, to call our office.  Verbalized understanding.

## 2018-07-03 ENCOUNTER — Telehealth: Payer: Self-pay | Admitting: Obstetrics & Gynecology

## 2018-07-03 NOTE — Telephone Encounter (Signed)
Pt called requesting a prescription for insulin syringes/needles. Advised pt that I would send the Rx to her pharmacy and she could check with them by the end of the day. Pt verbalized understanding.

## 2018-07-09 ENCOUNTER — Encounter (HOSPITAL_COMMUNITY): Payer: Self-pay

## 2018-07-09 ENCOUNTER — Inpatient Hospital Stay (HOSPITAL_COMMUNITY)
Admission: AD | Admit: 2018-07-09 | Discharge: 2018-07-09 | Disposition: A | Discharge status: Home / Self Care | Payer: Medicaid Other | Source: Ambulatory Visit | Attending: Family Medicine | Admitting: Family Medicine

## 2018-07-09 DIAGNOSIS — R0602 Shortness of breath: Secondary | ICD-10-CM

## 2018-07-09 DIAGNOSIS — R002 Palpitations: Secondary | ICD-10-CM | POA: Diagnosis not present

## 2018-07-09 DIAGNOSIS — F17210 Nicotine dependence, cigarettes, uncomplicated: Secondary | ICD-10-CM | POA: Diagnosis not present

## 2018-07-09 DIAGNOSIS — I493 Ventricular premature depolarization: Secondary | ICD-10-CM | POA: Insufficient documentation

## 2018-07-09 DIAGNOSIS — Z3A20 20 weeks gestation of pregnancy: Secondary | ICD-10-CM | POA: Insufficient documentation

## 2018-07-09 DIAGNOSIS — O99412 Diseases of the circulatory system complicating pregnancy, second trimester: Secondary | ICD-10-CM | POA: Diagnosis not present

## 2018-07-09 DIAGNOSIS — Z7982 Long term (current) use of aspirin: Secondary | ICD-10-CM | POA: Diagnosis not present

## 2018-07-09 DIAGNOSIS — O99332 Smoking (tobacco) complicating pregnancy, second trimester: Secondary | ICD-10-CM | POA: Diagnosis not present

## 2018-07-09 HISTORY — DX: Hypothyroidism, unspecified: E03.9

## 2018-07-09 LAB — COMPREHENSIVE METABOLIC PANEL
ALBUMIN: 3.3 g/dL — AB (ref 3.5–5.0)
ALT: 27 U/L (ref 0–44)
ANION GAP: 9 (ref 5–15)
AST: 22 U/L (ref 15–41)
Alkaline Phosphatase: 50 U/L (ref 38–126)
BUN: 9 mg/dL (ref 6–20)
CHLORIDE: 109 mmol/L (ref 98–111)
CO2: 20 mmol/L — AB (ref 22–32)
Calcium: 9.1 mg/dL (ref 8.9–10.3)
Creatinine, Ser: 0.57 mg/dL (ref 0.44–1.00)
GFR calc non Af Amer: >60 mL/min (ref >60)
Glucose, Bld: 63 mg/dL — ABNORMAL LOW (ref 70–99)
Potassium: 3.8 mmol/L (ref 3.5–5.1)
Sodium: 138 mmol/L (ref 135–145)
Total Bilirubin: 0.4 mg/dL (ref 0.3–1.2)
Total Protein: 6.9 g/dL (ref 6.5–8.1)

## 2018-07-09 LAB — URINALYSIS, ROUTINE W REFLEX MICROSCOPIC
Bilirubin Urine: NEGATIVE
Glucose, UA: NEGATIVE mg/dL
HGB URINE DIPSTICK: NEGATIVE
Ketones, ur: NEGATIVE mg/dL
Nitrite: NEGATIVE
Protein, ur: NEGATIVE mg/dL
SPECIFIC GRAVITY, URINE: 1.008 (ref 1.005–1.030)
pH: 6 (ref 5.0–8.0)

## 2018-07-09 LAB — CBC
HEMATOCRIT: 32.8 % — AB (ref 36.0–46.0)
HEMOGLOBIN: 11.5 g/dL — AB (ref 12.0–15.0)
MCH: 31.3 pg (ref 26.0–34.0)
MCHC: 35.1 g/dL (ref 30.0–36.0)
MCV: 89.1 fL (ref 78.0–100.0)
Platelets: 287 10*3/uL (ref 150–400)
RBC: 3.68 MIL/uL — ABNORMAL LOW (ref 3.87–5.11)
RDW: 13 % (ref 11.5–15.5)
WBC: 13.5 10*3/uL — ABNORMAL HIGH (ref 4.0–10.5)

## 2018-07-09 LAB — TSH: TSH: 1.395 u[IU]/mL (ref 0.350–4.500)

## 2018-07-09 NOTE — MAU Provider Note (Signed)
History     CSN: [id number]  Arrival date and time: 07/09/18 1237   First Provider Initiated Contact with Patient 07/09/18 1307      Chief Complaint  Patient presents with  . Tachycardia  . Shortness of Breath   HPI   [name] is a 37 y.o. female G2P1001 @ 88w1dwith a history of palpitations and  hypothyroidism, here in MAU with complaints of SOB, and palpations. She had this problem back in 2014 and was told she had a valve problem, she was then told that her valve was normal and there was a problem reading her Echo. She has not seen a cardiologist since then.  She was told that she has a heart murmer. She says she has had the palpitations her whole pregnancy, however in the last few days they have gotten more frequent. She feels the palpations several times per day. Says the palpitations make her feel like she cannot breath. She has to lay down frequently due to the palpitations.  No chest pain. No syncope. No radiation of pain or shoulder pain.   OB History    Gravida  2   Para  1   Term  1   Preterm      AB      Living  1     SAB      TAB      Ectopic      Multiple      Live Births  1           Past Medical History:  Diagnosis Date  . Chest pain 07/24/2012   2D Echo EF 60%-65% which shoewd a bicuspid aortic vavle with mild stenosis  . Diabetes mellitus    type 2 on insulin  . Heart valve problem   . Herpes   . Hypothyroidism   . Irregular periods 04/14/2014  . Mental disorder    depression, postpartum depression  . Palpitation   . Pregnant   . PVC (premature ventricular contraction) 07/24/2012   stress test which was normal with good excercise  . Thyroid disease   . Vaginal discharge 04/14/2014  . Yeast infection 05/27/2015    Past Surgical History:  Procedure Laterality Date  . WISDOM TOOTH EXTRACTION      Family History  Problem Relation Age of Onset  . Hypertension Mother   . Diabetes Brother   . Diabetes Maternal Grandmother    . Congestive Heart Failure Maternal Grandmother   . Hearing loss Daughter     Social History   Tobacco Use  . Smoking status: Current Every Day Smoker    Packs/day: 0.25    Years: 5.00    Pack years: 1.25    Types: Cigarettes    Last attempt to quit: 05/20/2012    Years since quitting: 6.1  . Smokeless tobacco: Never Used  . Tobacco comment: 5 cigs per day  Substance Use Topics  . Alcohol use: No    Alcohol/week: 0.0 standard drinks  . Drug use: No    Allergies:  Allergies  Allergen Reactions  . Keflex [Cephalexin] Nausea And Vomiting    Facility-Administered Medications Prior to Admission  Medication Dose Route Frequency Provider Last Rate Last Dose  . insulin starter kit- syringes (English) 1 kit  1 kit Other Once FJonnie Kind MD       Medications Prior to Admission  Medication Sig Dispense Refill Last Dose  . acyclovir (ZOVIRAX) 400 MG tablet Take 1 tablet (400  mg total) by mouth 3 (three) times daily. 90 tablet 11 07/09/2018 at Unknown time  . aspirin 81 MG chewable tablet Chew by mouth daily.   07/09/2018 at Unknown time  . Doxylamine-Pyridoxine (DICLEGIS) 10-10 MG TBEC 2 tabs q hs, if sx persist add 1 tab q am on day 3, if sx persist add 1 tab q afternoon on day 4 (Patient taking differently: Take 2 tablets by mouth at bedtime as needed. ) 100 tablet 6 07/08/2018 at Unknown time  . insulin aspart (NOVOLOG) 100 UNIT/ML injection Inject 10 Units into the skin 2 (two) times daily before a meal. Before breakfast and supper (Patient taking differently: Inject 14 Units into the skin 2 (two) times daily before a meal. Before breakfast and supper) 10 mL 12 07/09/2018 at Unknown time  . insulin NPH Human (HUMULIN N,NOVOLIN N) 100 UNIT/ML injection Inject 0.2 mLs (20 Units total) into the skin daily before breakfast. And 10 units or 0.46ms prior to supper (Patient taking differently: Inject 16-26 Units into the skin 2 (two) times daily before a meal. And 10 units or 0.188m prior  to supper) 10 mL 3 07/09/2018 at Unknown time  . levothyroxine (SYNTHROID, LEVOTHROID) 150 MCG tablet Take 1 tablet (150 mcg total) by mouth daily before breakfast. 30 tablet 6 07/09/2018 at Unknown time  . metFORMIN (GLUCOPHAGE) 1000 MG tablet Take 1 tablet (1,000 mg total) by mouth 2 (two) times daily with a meal. 60 tablet 5 07/09/2018 at Unknown time  . sulfamethoxazole-trimethoprim (BACTRIM DS,SEPTRA DS) 800-160 MG tablet Take 1 tablet by mouth 2 (two) times daily. X 7 days (Patient not taking: Reported on 06/17/2018) 14 tablet 0 Not Taking at Unknown time   Results for orders placed or performed during the hospital encounter of 07/09/18 (from the past 48 hour(s))  TSH     Status: None   Collection Time: 07/09/18  1:23 PM  Result Value Ref Range   TSH 1.395 0.350 - 4.500 uIU/mL    Comment: Performed by a 3rd Generation assay with a functional sensitivity of <=0.01 uIU/mL. Performed at [facility] [place] [id number] CBC     Status: Abnormal   Collection Time: 07/09/18  1:23 PM  Result Value Ref Range   WBC 13.5 (H) 4.0 - 10.5 K/uL   RBC 3.68 (L) 3.87 - 5.11 MIL/uL   Hemoglobin 11.5 (L) 12.0 - 15.0 g/dL   HCT 32.8 (L) 36.0 - 46.0 %   MCV 89.1 78.0 - 100.0 fL   MCH 31.3 26.0 - 34.0 pg   MCHC 35.1 30.0 - 36.0 g/dL   RDW 13.0 11.5 - 15.5 %   Platelets 287 150 - 400 K/uL    Comment: Performed at [facility] [place] [id number] metabolic panel     Status: Abnormal   Collection Time: 07/09/18  1:23 PM  Result Value Ref Range   Sodium 138 135 - 145 mmol/L   Potassium 3.8 3.5 - 5.1 mmol/L   Chloride 109 98 - 111 mmol/L   CO2 20 (L) 22 - 32 mmol/L   Glucose, Bld 63 (L) 70 - 99 mg/dL   BUN 9 6 - 20 mg/dL   Creatinine, Ser 0.57 0.44 - 1.00 mg/dL   Calcium 9.1 8.9 - 10.3 mg/dL   Total Protein 6.9 6.5 - 8.1 g/dL   Albumin 3.3 (L) 3.5 - 5.0 g/dL   AST 22 15 - 41 U/L   ALT 27  0 - 44 U/L   Alkaline Phosphatase 50 38 -  126 U/L   Total Bilirubin 0.4 0.3 - 1.2 mg/dL   GFR calc non Af Amer >60 >60 mL/min   GFR calc Af Amer >60 >60 mL/min    Comment: (NOTE) The eGFR has been calculated using the CKD EPI equation. This calculation has not been validated in all clinical situations. eGFR's persistently <60 mL/min signify possible Chronic Kidney Disease.    Anion gap 9 5 - 15    Comment: Performed at [facility], [address], [place], [place] [address]  Urinalysis, Routine w reflex microscopic     Status: Abnormal   Collection Time: 07/09/18  1:25 PM  Result Value Ref Range   Color, Urine STRAW (A) YELLOW   APPearance CLEAR CLEAR   Specific Gravity, Urine 1.008 1.005 - 1.030   pH 6.0 5.0 - 8.0   Glucose, UA NEGATIVE NEGATIVE mg/dL   Hgb urine dipstick NEGATIVE NEGATIVE   Bilirubin Urine NEGATIVE NEGATIVE   Ketones, ur NEGATIVE NEGATIVE mg/dL   Protein, ur NEGATIVE NEGATIVE mg/dL   Nitrite NEGATIVE NEGATIVE   Leukocytes, UA TRACE (A) NEGATIVE   RBC / HPF 0-5 0 - 5 RBC/hpf   WBC, UA 0-5 0 - 5 WBC/hpf   Bacteria, UA FEW (A) NONE SEEN   Squamous Epithelial / LPF 0-5 0 - 5    Comment: Performed at [facility], [address] [place],  [address]   Review of Systems  Respiratory: Positive for shortness of breath.   Cardiovascular: Negative for chest pain.  Gastrointestinal: Negative for abdominal pain.  Musculoskeletal: Negative for neck pain.   Physical Exam   Blood pressure 133/85, pulse 94, temperature 98.9 F (37.2 C), temperature source Oral, resp. rate 18, weight 101.5 kg, SpO2 99 %.  Physical Exam  Constitutional: She is oriented to person, place, and time. She appears well-developed and well-nourished. No distress.  Eyes: Pupils are equal, round, and reactive to light.  Cardiovascular: A regularly irregular rhythm present.  Occasional extrasystoles are present.  Musculoskeletal: Normal range of motion.  Neurological: She is alert and oriented to person, place, and time.   Skin: Skin is warm. She is not diaphoretic.  Psychiatric: Her behavior is normal.    MAU Course  Procedures  None  MDM  + fetal heart tones via doppler  CBC & CMP 100% on RA. EKG read and discussed with Dr. Irish Lack with cardiology. He will notify the office to call the patient and schedule her for an outpatient Echo  Assessment and Plan   A:  1. PVC's (premature ventricular contractions)   2. Shortness of breath   3. Palpitations     P:  Discharge home in stable condition If symptoms worsen go to [facility] with cardiology, the office will call you to schedule  Limited caffeine Increase oral fluid intake.    Lezlie Lye, NP 07/09/2018 6:57 PM

## 2018-07-09 NOTE — MAU Note (Signed)
Pt states she has been feeling like her heart is racing the last couple of weeks and starting last night was feeling SOB. No pain or bleeding. Called MD and was told to come to MAU.

## 2018-07-09 NOTE — Discharge Instructions (Signed)
Cardiac Event Monitoring °A cardiac event monitor is a small recording device that is used to detect abnormal heart rhythms (arrhythmias). The monitor is used to record your heart rhythm when you have symptoms, such as: °· Fast heartbeats (palpitations), such as heart racing or fluttering. °· Dizziness. °· Fainting or light-headedness. °· Unexplained weakness. ° °Some monitors are wired to electrodes placed on your chest. Electrodes are flat, sticky disks that attach to your skin. Other monitors may be hand-held or worn on the wrist. The monitor can be worn for up to 30 days. °If the monitor is attached to your chest, a technician will prepare your chest for the electrode placement and show you how to work the monitor. Take time to practice using the monitor before you leave the office. Make sure you understand how to send the information from the monitor to your health care provider. In some cases, you may need to use a landline telephone instead of a cell phone. °What are the risks? °Generally, this device is safe to use, but it possible that the skin under the electrodes will become irritated. °How to use your cardiac event monitor °· Wear your monitor at all times, except when you are in water: °? Do not let the monitor get wet. °? Take the monitor off when you bathe. Do not swim or use a hot tub with it on. °· Keep your skin clean. Do not put body lotion or moisturizer on your chest. °· Change the electrodes as told by your health care provider or any time they stop sticking to your skin. You may need to use medical tape to keep them on. °· Try to put the electrodes in slightly different places on your chest to help prevent skin irritation. They must remain in the area under your left breast and in the upper right section of your chest. °· Make sure the monitor is safely clipped to your clothing or in a location close to your body that your health care provider recommends. °· Press the button to record as soon  as you feel heart-related symptoms, such as: °? Dizziness. °? Weakness. °? Light-headedness. °? Palpitations. °? Thumping or pounding in your chest. °? Shortness of breath. °? Unexplained weakness. °· Keep a diary of your activities, such as walking, doing chores, and taking medicine. It is very important to note what you were doing when you pushed the button to record your symptoms. This will help your health care provider determine what might be contributing to your symptoms. °· Send the recorded information as recommended by your health care provider. It may take some time for your health care provider to process the results. °· Change the batteries as told by your health care provider. °· Keep electronic devices away from your monitor. This includes: °? Tablets. °? MP3 players. °? Cell phones. °· While wearing your monitor you should avoid: °? Electric blankets. °? Electric razors. °? Electric toothbrushes. °? Microwave ovens. °? Magnets. °? Metal detectors. °Get help right away if: °· You have chest pain. °· You have extreme difficulty breathing or shortness of breath. °· You develop a very fast heartbeat that persists. °· You develop dizziness that does not go away. °· You faint or constantly feel like you are about to faint. °Summary °· A cardiac event monitor is a small recording device that is used to help detect abnormal heart rhythms (arrhythmias). °· The monitor is used to record your heart rhythm when you have heart-related symptoms. °· Make   sure you understand how to send the information from the monitor to your health care provider. °· It is important to press the button on the monitor when you have any heart-related symptoms. °· Keep a diary of your activities, such as walking, doing chores, and taking medicine. It is very important to note what you were doing when you pushed the button to record your symptoms. This will help your health care provider learn what might be causing your symptoms. °This  information is not intended to replace advice given to you by your health care provider. Make sure you discuss any questions you have with your health care provider. °Document Released: 07/03/2008 Document Revised: 09/08/2016 Document Reviewed: 09/08/2016 °Elsevier Interactive Patient Education © 2017 [employer] ° °

## 2018-07-11 ENCOUNTER — Encounter: Payer: Self-pay | Admitting: Obstetrics & Gynecology

## 2018-07-11 ENCOUNTER — Other Ambulatory Visit: Payer: Self-pay

## 2018-07-11 ENCOUNTER — Ambulatory Visit (INDEPENDENT_AMBULATORY_CARE_PROVIDER_SITE_OTHER): Payer: Medicaid Other | Admitting: Obstetrics & Gynecology

## 2018-07-11 ENCOUNTER — Ambulatory Visit (INDEPENDENT_AMBULATORY_CARE_PROVIDER_SITE_OTHER): Payer: Medicaid Other

## 2018-07-11 VITALS — BP 108/74 | HR 83 | Wt 224.0 lb

## 2018-07-11 DIAGNOSIS — O24319 Unspecified pre-existing diabetes mellitus in pregnancy, unspecified trimester: Secondary | ICD-10-CM

## 2018-07-11 DIAGNOSIS — Z331 Pregnant state, incidental: Secondary | ICD-10-CM

## 2018-07-11 DIAGNOSIS — Z1389 Encounter for screening for other disorder: Secondary | ICD-10-CM

## 2018-07-11 DIAGNOSIS — O24312 Unspecified pre-existing diabetes mellitus in pregnancy, second trimester: Secondary | ICD-10-CM

## 2018-07-11 DIAGNOSIS — Z3A20 20 weeks gestation of pregnancy: Secondary | ICD-10-CM

## 2018-07-11 DIAGNOSIS — Z23 Encounter for immunization: Secondary | ICD-10-CM | POA: Diagnosis not present

## 2018-07-11 DIAGNOSIS — O0992 Supervision of high risk pregnancy, unspecified, second trimester: Secondary | ICD-10-CM

## 2018-07-11 DIAGNOSIS — Z363 Encounter for antenatal screening for malformations: Secondary | ICD-10-CM

## 2018-07-11 DIAGNOSIS — O0990 Supervision of high risk pregnancy, unspecified, unspecified trimester: Secondary | ICD-10-CM

## 2018-07-11 LAB — POCT URINALYSIS DIPSTICK OB
Ketones, UA: NEGATIVE
Leukocytes, UA: NEGATIVE
Nitrite, UA: NEGATIVE
POC,PROTEIN,UA: NEGATIVE
RBC UA: NEGATIVE

## 2018-07-11 NOTE — Progress Notes (Signed)
   HIGH-RISK PREGNANCY VISIT Patient name: Miranda [name] MRN [id number]  Date of birth: April 14, 1981 Chief Complaint:   High Risk Gestation (u/s today)  History of Present Illness:   Miranda [name] is a 37 y.o. G24P1001 female at [redacted]w[redacted]d with an Estimated Date of Delivery: 11/25/18 being seen today for ongoing management of a high-risk pregnancy complicated by class  B DM.  Today she reports no complaints. Contractions: Not present. Vag. Bleeding: None.  Movement: Present. denies leaking of fluid.  Review of Systems:   Pertinent items are noted in HPI Denies abnormal vaginal discharge w/ itching/odor/irritation, headaches, visual changes, shortness of breath, chest pain, abdominal pain, severe nausea/vomiting, or problems with urination or bowel movements unless otherwise stated above. Pertinent History Reviewed:  Reviewed past medical,surgical, social, obstetrical and family history.  Reviewed problem list, medications and allergies. Physical Assessment:   Vitals:   07/11/18 1218  BP: 108/74  Pulse: 83  Weight: 224 lb (101.6 kg)  Body mass index is 34.06 kg/m.           Physical Examination:   General appearance: alert, well appearing, and in no distress  Mental status: alert, oriented to person, place, and time  Skin: warm & dry   Extremities: Edema: None    Cardiovascular: normal heart rate noted  Respiratory: normal respiratory effort, no distress  Abdomen: gravid, soft, non-tender  Pelvic: Cervical exam deferred         Fetal Status:     Movement: Present    Fetal Surveillance Testing today: sonogram   Results for orders placed or performed in visit on 07/11/18 (from the past 24 hour(s))  POC Urinalysis Dipstick OB   Collection Time: 07/11/18 12:18 PM  Result Value Ref Range   Color, UA     Clarity, UA     Glucose, UA Trace (A) Negative   Bilirubin, UA     Ketones, UA neg    Spec Grav, UA     Blood, UA neg    pH, UA     POC Protein UA Negative Negative, Trace   Urobilinogen, UA     Nitrite, UA neg    Leukocytes, UA Negative Negative   Appearance     Odor      Assessment & Plan:  1) High-risk pregnancy G2P1001 at [redacted]w[redacted]d with an Estimated Date of Delivery: 11/25/18   2) Class B DM, unstable, still increasing insulin for improved control Increase am to 30NPH/18Reg  Pm 20N/16R in pm CBG better not yet in acceptable range    Meds: No orders of the defined types were placed in this encounter.   Labs/procedures today: sonogram  Treatment Plan:  Close surveillance due to insulin adjustment,   Reviewed: Preterm labor symptoms and general obstetric precautions including but not limited to vaginal bleeding, contractions, leaking of fluid and fetal movement were reviewed in detail with the patient.  All questions were answered.  Follow-up: Return in about 2 weeks (around 07/25/2018) for HROB, with Dr Despina Hidden.  Orders Placed This Encounter  Procedures  . Flu Vaccine QUAD 36+ mos IM  . POC Urinalysis Dipstick OB   Luther H Eure  07/11/2018 1:00 PM

## 2018-07-11 NOTE — Progress Notes (Signed)
[place] 20+3 wks,breech,posterior pl gr 0,cx 4.2 cm,svp of fluid 5.4 cm,normal ovaries bilat,fhr 152 bpm,EFW 410 g 85%,anatomy complete,no obvious abnormalities

## 2018-07-17 ENCOUNTER — Encounter: Payer: Medicaid Other | Attending: Obstetrics and Gynecology | Admitting: Nutrition

## 2018-07-17 VITALS — Ht 68.0 in | Wt 226.0 lb

## 2018-07-17 DIAGNOSIS — O24112 Pre-existing diabetes mellitus, type 2, in pregnancy, second trimester: Secondary | ICD-10-CM

## 2018-07-17 NOTE — Patient Instructions (Addendum)
Goals Follow Gestational Guidelines as discussed. Eat 30 grams of CHO with meals and 15 g for snacks along with other foods. Call MD if BS less than 60 mg/d 2-3 times in a row.  Keep FBS less than 90 and 2 hour after meals less than 120 mg/dl. Drink water only Walk 30-60 minutes a day. Increase fresh fruit and vegetables.

## 2018-07-17 NOTE — Progress Notes (Signed)
Medical Nutrition Therapy:  Appt start time: 1330 end time:  1430.  Assessment:  Primary concerns today: Diabets Type 2 at [redacted] weeks pregnant. Sees [facility]. Lives with her daughter(38 yrs old).  Eats most meals at home and at her moms. Currently not working. Eats 3 meals per day. Testing 4 times per day.  P2 G 1.  FBS 90-120's and 2 hrs after B: 58 -119 L) 109-120's AD 79-152  NPH 30 with breakfast and 20 units with dinner Novolog 18 units  with breakfast and 16 units with dinner.  Meformin 1000 mg BID TDD Insulin is 84 units a day. 2/3 of insulin needs before breakfast(NPH + Novolog) and 1/3 of insulin(NPH + Novolog) needs before supper. ADjustments being made according to her blood sugars.  She notes she is stressed during this pregnancy. Stays hungry and then has low blood sugars. She reports testing and taking insulin as prescribed. Walks for exercise. Eat 3 meals per day but hasn't been eating snacks. Only drinks water  Engaged to make changes with diet and improve blood sugars and keep tight control during pregnancy.  Preferred Learning Style:   No preference indicated   Learning Readiness:   Ready  Change in progress   MEDICATIONS:    DIETARY INTAKE:   24-hr recall:  B ( AM): Special K or honeynut cherrios or sauage or egg sandwich or oatmeal; water  Snk ( AM):  L ( PM): Tuna sandwich on wheat bread, PB crackers 6 pk,  Whole banana, water Snk ( PM): D ( PM): Rice soup  1 1/2 c-2 cups, water Snk ( PM): 2 pack of nabs.  Beverages: water  Usual physical activity: Walk 3 times a week 30 minutes   Estimated energy needs: 2000  calories 25 g carbohydrates 150  g protein 56 g fat  Progress Towards Goal(s):  In progress.   Nutritional Diagnosis:  NB-1.1 Food and nutrition-related knowledge deficit As related to Diabetes Type 2 at [redacted] weeks gestation.  As evidenced by A1C 7.2%.    Intervention: Nutrition and Gestational DM education provided on My Plate, CHO  counting, meal planning, portion sizes, timing of meals, avoiding snacks between meals unless having a low blood sugar, target ranges for A1C and blood sugars, signs/symptoms and treatment of hyper/hypoglycemia, monitoring blood sugars, taking medications as prescribed, benefits of exercising 30 minutes per day and prevention of complications of DM.   Patient was seen on 07-17-18 for Gestational Diabetes self-management class at [facility]. The following learning objectives were met by the patient during this course:   States the definition of Gestational Diabetes  States why dietary management is important in controlling blood glucose  Describes the effects each nutrient has on blood glucose levels  Demonstrates ability to create a balanced meal plan  Demonstrates carbohydrate counting   States when to check blood glucose levels  Demonstrates proper blood glucose monitoring techniques  States the effect of stress and exercise on blood glucose levels  States the importance of limiting caffeine and abstaining from alcohol and smoking  Patient instructed to monitor glucose levels: FBS: 60 - <90 1 hour: <140 2 hour: <120   Goals Follow Gestational Guidelines as discussed. Eat 30 grams of CHO with meals and 15 g for snacks along with other foods. Call MD if BS less than 60 mg/d 2-3 times in a row.  Keep FBS less than 90 and 2 hour after meals less than 120 mg/dl. Drink water   only Walk 30-60 minutes a day. Increase fresh fruit and vegetables.   *Patient received handouts:  Nutrition Diabetes and Pregnancy  Carbohydrate Counting List  Patient will be seen for follow-up as needed.   Demonstrated degree of understanding via:  Teach Back   Monitoring/Evaluation:  Dietary intake, exercise, meal planning, and body weight in 1 week(s).  

## 2018-07-21 ENCOUNTER — Encounter: Payer: Self-pay | Admitting: Nutrition

## 2018-07-24 ENCOUNTER — Ambulatory Visit: Payer: Medicaid Other | Admitting: Nutrition

## 2018-07-25 ENCOUNTER — Other Ambulatory Visit: Payer: Self-pay

## 2018-07-25 ENCOUNTER — Encounter: Payer: Self-pay | Admitting: Obstetrics & Gynecology

## 2018-07-25 ENCOUNTER — Ambulatory Visit (INDEPENDENT_AMBULATORY_CARE_PROVIDER_SITE_OTHER): Payer: Medicaid Other | Admitting: Obstetrics & Gynecology

## 2018-07-25 VITALS — BP 128/67 | HR 89 | Wt 230.0 lb

## 2018-07-25 DIAGNOSIS — Z1389 Encounter for screening for other disorder: Secondary | ICD-10-CM

## 2018-07-25 DIAGNOSIS — O0992 Supervision of high risk pregnancy, unspecified, second trimester: Secondary | ICD-10-CM

## 2018-07-25 DIAGNOSIS — O24319 Unspecified pre-existing diabetes mellitus in pregnancy, unspecified trimester: Secondary | ICD-10-CM

## 2018-07-25 DIAGNOSIS — O0990 Supervision of high risk pregnancy, unspecified, unspecified trimester: Secondary | ICD-10-CM

## 2018-07-25 DIAGNOSIS — Z331 Pregnant state, incidental: Secondary | ICD-10-CM

## 2018-07-25 DIAGNOSIS — O24312 Unspecified pre-existing diabetes mellitus in pregnancy, second trimester: Secondary | ICD-10-CM

## 2018-07-25 DIAGNOSIS — Z3A22 22 weeks gestation of pregnancy: Secondary | ICD-10-CM

## 2018-07-25 LAB — POCT URINALYSIS DIPSTICK OB
Glucose, UA: NEGATIVE
Leukocytes, UA: NEGATIVE
Nitrite, UA: NEGATIVE
POC,PROTEIN,UA: NEGATIVE
RBC UA: NEGATIVE

## 2018-07-25 NOTE — Progress Notes (Signed)
   HIGH-RISK PREGNANCY VISIT Patient name: ABBEE [name] MRN [id number]  Date of birth: 07-22-1981 Chief Complaint:   High Risk Gestation  History of Present Illness:   Miranda [name] is a 37 y.o. G97P1001 female at [redacted]w[redacted]d with an Estimated Date of Delivery: 11/25/18 being seen today for ongoing management of a high-risk pregnancy complicated by class  B DM.  Today she reports no complaints.  . Vag. Bleeding: None.  Movement: Present. denies leaking of fluid.  Review of Systems:   Pertinent items are noted in HPI Denies abnormal vaginal discharge w/ itching/odor/irritation, headaches, visual changes, shortness of breath, chest pain, abdominal pain, severe nausea/vomiting, or problems with urination or bowel movements unless otherwise stated above. Pertinent History Reviewed:  Reviewed past medical,surgical, social, obstetrical and family history.  Reviewed problem list, medications and allergies. Physical Assessment:   Vitals:   07/25/18 0852  BP: 128/67  Pulse: 89  Weight: 230 lb (104.3 kg)  Body mass index is 34.97 kg/m.           Physical Examination:   General appearance: alert, well appearing, and in no distress  Mental status: alert, oriented to person, place, and time  Skin: warm & dry   Extremities: Edema: Trace    Cardiovascular: normal heart rate noted  Respiratory: normal respiratory effort, no distress  Abdomen: gravid, soft, non-tender  Pelvic: Cervical exam deferred         Fetal Status: Fetal Heart Rate (bpm): 155 Fundal Height: 26 cm Movement: Present    Fetal Surveillance Testing today: FHR 160   Results for orders placed or performed in visit on 07/25/18 (from the past 24 hour(s))  POC Urinalysis Dipstick OB   Collection Time: 07/25/18  8:51 AM  Result Value Ref Range   Color, UA     Clarity, UA     Glucose, UA Negative Negative   Bilirubin, UA     Ketones, UA small    Spec Grav, UA     Blood, UA neg    pH, UA     POC Protein UA Negative Negative, Trace     Urobilinogen, UA     Nitrite, UA neg    Leukocytes, UA Negative Negative   Appearance     Odor      Assessment & Plan:  1) High-risk pregnancy G2P1001 at [redacted]w[redacted]d with an Estimated Date of Delivery: 11/25/18   2) Class B DM, stable, split dose NPH and Regular is doing a great jobe increase just very slightly  3) AMA, stable  Meds: No orders of the defined types were placed in this encounter.   Labs/procedures today: none  Treatment Plan:  Follow up 2 weeks with Dr Despina Hidden  Reviewed: Preterm labor symptoms and general obstetric precautions including but not limited to vaginal bleeding, contractions, leaking of fluid and fetal movement were reviewed in detail with the patient.  All questions were answered.  Follow-up: Return in about 2 weeks (around 08/08/2018) for HROB, with Dr Despina Hidden.  Orders Placed This Encounter  Procedures  . POC Urinalysis Dipstick OB   [name]  07/25/2018 9:22 AM

## 2018-08-07 ENCOUNTER — Ambulatory Visit (INDEPENDENT_AMBULATORY_CARE_PROVIDER_SITE_OTHER): Payer: Medicaid Other | Admitting: Obstetrics & Gynecology

## 2018-08-07 ENCOUNTER — Encounter: Payer: Self-pay | Admitting: Obstetrics & Gynecology

## 2018-08-07 VITALS — BP 107/68 | HR 85 | Wt 229.0 lb

## 2018-08-07 DIAGNOSIS — Z331 Pregnant state, incidental: Secondary | ICD-10-CM

## 2018-08-07 DIAGNOSIS — Z1389 Encounter for screening for other disorder: Secondary | ICD-10-CM

## 2018-08-07 DIAGNOSIS — O09522 Supervision of elderly multigravida, second trimester: Secondary | ICD-10-CM

## 2018-08-07 DIAGNOSIS — O24319 Unspecified pre-existing diabetes mellitus in pregnancy, unspecified trimester: Secondary | ICD-10-CM

## 2018-08-07 DIAGNOSIS — O0990 Supervision of high risk pregnancy, unspecified, unspecified trimester: Secondary | ICD-10-CM

## 2018-08-07 DIAGNOSIS — Z3A24 24 weeks gestation of pregnancy: Secondary | ICD-10-CM

## 2018-08-07 LAB — POCT URINALYSIS DIPSTICK OB
Glucose, UA: NEGATIVE
Leukocytes, UA: NEGATIVE
Nitrite, UA: NEGATIVE
POC,PROTEIN,UA: NEGATIVE
RBC UA: NEGATIVE

## 2018-08-07 NOTE — Progress Notes (Signed)
   HIGH-RISK PREGNANCY VISIT Patient name: Miranda [name] MRN [id number]  Date of birth: [birth year] Chief Complaint:   High Risk Gestation  History of Present Illness:   Miranda [name] is a 37 y.o. G57P1001 female at [redacted]w[redacted]d with an Estimated Date of Delivery: 11/25/18 being seen today for ongoing management of a high-risk pregnancy complicated by Class B DM, AMA.  Today she reports no complaints. Contractions: Not present. Vag. Bleeding: None.  Movement: Present. denies leaking of fluid.  Review of Systems:   Pertinent items are noted in HPI Denies abnormal vaginal discharge w/ itching/odor/irritation, headaches, visual changes, shortness of breath, chest pain, abdominal pain, severe nausea/vomiting, or problems with urination or bowel movements unless otherwise stated above. Pertinent History Reviewed:  Reviewed past medical,surgical, social, obstetrical and family history.  Reviewed problem list, medications and allergies. Physical Assessment:   Vitals:   08/07/18 0858  BP: 107/68  Pulse: 85  Weight: 229 lb (103.9 kg)  Body mass index is 34.82 kg/m.           Physical Examination:   General appearance: alert, well appearing, and in no distress  Mental status: alert, oriented to person, place, and time  Skin: warm & dry   Extremities: Edema: None    Cardiovascular: normal heart rate noted  Respiratory: normal respiratory effort, no distress  Abdomen: gravid, soft, non-tender  Pelvic: Cervical exam deferred         Fetal Status: Fetal Heart Rate (bpm): 171 Fundal Height: 28 cm Movement: Present    Fetal Surveillance Testing today:    Results for orders placed or performed in visit on 08/07/18 (from the past 24 hour(s))  POC Urinalysis Dipstick OB   Collection Time: 08/07/18  8:58 AM  Result Value Ref Range   Color, UA     Clarity, UA     Glucose, UA Negative Negative   Bilirubin, UA     Ketones, UA 2+    Spec Grav, UA     Blood, UA neg    pH, UA     POC Protein UA Negative  Negative, Trace   Urobilinogen, UA     Nitrite, UA neg    Leukocytes, UA Negative Negative   Appearance     Odor      Assessment & Plan:  1) High-risk pregnancy G2P1001 at [redacted]w[redacted]d with an Estimated Date of Delivery: 11/25/18   2) Class B DM, difficult to control, but improving, keep on sam dosing  3) AMA, stable  Meds: No orders of the defined types were placed in this encounter.   Labs/procedures today:   Treatment Plan:  Per routine care for A2 DM  Reviewed: Preterm labor symptoms and general obstetric precautions including but not limited to vaginal bleeding, contractions, leaking of fluid and fetal movement were reviewed in detail with the patient.  All questions were answered.  Follow-up: Return in about 2 weeks (around 08/21/2018) for HROB.  Orders Placed This Encounter  Procedures  . POC Urinalysis Dipstick OB   [name]  08/07/2018 9:20 AM

## 2018-08-21 ENCOUNTER — Ambulatory Visit (INDEPENDENT_AMBULATORY_CARE_PROVIDER_SITE_OTHER): Payer: Medicaid Other | Admitting: Women's Health

## 2018-08-21 ENCOUNTER — Encounter: Payer: Self-pay | Admitting: Women's Health

## 2018-08-21 VITALS — BP 113/68 | HR 97 | Wt 230.4 lb

## 2018-08-21 DIAGNOSIS — E039 Hypothyroidism, unspecified: Secondary | ICD-10-CM

## 2018-08-21 DIAGNOSIS — Z1389 Encounter for screening for other disorder: Secondary | ICD-10-CM

## 2018-08-21 DIAGNOSIS — O0992 Supervision of high risk pregnancy, unspecified, second trimester: Secondary | ICD-10-CM

## 2018-08-21 DIAGNOSIS — Z331 Pregnant state, incidental: Secondary | ICD-10-CM

## 2018-08-21 DIAGNOSIS — O24112 Pre-existing diabetes mellitus, type 2, in pregnancy, second trimester: Secondary | ICD-10-CM

## 2018-08-21 DIAGNOSIS — O99282 Endocrine, nutritional and metabolic diseases complicating pregnancy, second trimester: Secondary | ICD-10-CM

## 2018-08-21 DIAGNOSIS — Z3A26 26 weeks gestation of pregnancy: Secondary | ICD-10-CM

## 2018-08-21 LAB — POCT URINALYSIS DIPSTICK OB
Glucose, UA: NEGATIVE
Leukocytes, UA: NEGATIVE
Nitrite, UA: NEGATIVE
POC,PROTEIN,UA: NEGATIVE
RBC UA: NEGATIVE

## 2018-08-21 MED ORDER — INSULIN NPH (HUMAN) (ISOPHANE) 100 UNIT/ML ~~LOC~~ SUSP: SUBCUTANEOUS | 6 refills | Status: DC

## 2018-08-21 MED ORDER — INSULIN ASPART 100 UNIT/ML ~~LOC~~ SOLN: SUBCUTANEOUS | 6 refills | Status: DC

## 2018-08-21 NOTE — Patient Instructions (Signed)
Miranda MaxwellMary S Guilfoil, I greatly value your feedback.  If you receive a survey following your visit with us today, we appreciate you taking the time to fill it out.  Thanks, Joellyn HaffKim Lamont Glasscock, CNM, WHNP-BC   Call the office [phone]) or go to [facility] if:  You begin to have strong, frequent contractions  Your water breaks.  Sometimes it is a big gush of fluid, sometimes it is just a trickle that keeps getting your panties wet or running down your legs  You have vaginal bleeding.  It is normal to have a small amount of spotting if your cervix was checked.   You don't feel your baby moving like normal.  If you don't, get you something to eat and drink and lay down and focus on feeling your baby move.  You should feel at least 10 movements in 2 hours.  If you don't, you should call the office or go to [facility].    Tdap Vaccine  It is recommended that you get the Tdap vaccine during the third trimester of EACH pregnancy to help protect your baby from getting pertussis (whooping cough)  27-36 weeks is the BEST time to do this so that you can pass the protection on to your baby. During pregnancy is better than after pregnancy, but if you are unable to get it during pregnancy it will be offered at the hospital.   You can get this vaccine with us, at the health department, your family doctor, or some local pharmacies  Everyone who will be around your baby should also be up-to-date on their vaccines before the baby comes. Adults (who are not pregnant) only need 1 dose of Tdap during adulthood.   Third Trimester of Pregnancy The third trimester is from week 29 through week 42, months 7 through 9. The third trimester is a time when the fetus is growing rapidly. At the end of the ninth month, the fetus is about 20 inches in length and weighs 6-10 pounds.  BODY CHANGES Your body goes through many changes during pregnancy. The changes vary from woman to woman.   Your weight will continue to  increase. You can expect to gain 25-35 pounds (11-16 kg) by the end of the pregnancy.  You may begin to get stretch marks on your hips, abdomen, and breasts.  You may urinate more often because the fetus is moving lower into your pelvis and pressing on your bladder.  You may develop or continue to have heartburn as a result of your pregnancy.  You may develop constipation because certain hormones are causing the muscles that push waste through your intestines to slow down.  You may develop hemorrhoids or swollen, bulging veins (varicose veins).  You may have pelvic pain because of the weight gain and pregnancy hormones relaxing your joints between the bones in your pelvis. Backaches may result from overexertion of the muscles supporting your posture.  You may have changes in your hair. These can include thickening of your hair, rapid growth, and changes in texture. Some women also have hair loss during or after pregnancy, or hair that feels dry or thin. Your hair will most likely return to normal after your baby is born.  Your breasts will continue to grow and be tender. A yellow discharge may leak from your breasts called colostrum.  Your belly button may stick out.  You may feel short of breath because of your expanding uterus.  You may notice the fetus "dropping," or moving lower  in your abdomen.  You may have a bloody mucus discharge. This usually occurs a few days to a week before labor begins.  Your cervix becomes thin and soft (effaced) near your due date. WHAT TO EXPECT AT YOUR PRENATAL EXAMS  You will have prenatal exams every 2 weeks until week 36. Then, you will have weekly prenatal exams. During a routine prenatal visit:  You will be weighed to make sure you and the fetus are growing normally.  Your blood pressure is taken.  Your abdomen will be measured to track your baby's growth.  The fetal heartbeat will be listened to.  Any test results from the previous visit  will be discussed.  You may have a cervical check near your due date to see if you have effaced. At around 36 weeks, your caregiver will check your cervix. At the same time, your caregiver will also perform a test on the secretions of the vaginal tissue. This test is to determine if a type of bacteria, Group B streptococcus, is present. Your caregiver will explain this further. Your caregiver may ask you:  What your birth plan is.  How you are feeling.  If you are feeling the baby move.  If you have had any abnormal symptoms, such as leaking fluid, bleeding, severe headaches, or abdominal cramping.  If you have any questions. Other tests or screenings that may be performed during your third trimester include:  Blood tests that check for low iron levels (anemia).  Fetal testing to check the health, activity level, and growth of the fetus. Testing is done if you have certain medical conditions or if there are problems during the pregnancy. FALSE LABOR You may feel small, irregular contractions that eventually go away. These are called Braxton Hicks contractions, or false labor. Contractions may last for hours, days, or even weeks before true labor sets in. If contractions come at regular intervals, intensify, or become painful, it is best to be seen by your caregiver.  SIGNS OF LABOR   Menstrual-like cramps.  Contractions that are 5 minutes apart or less.  Contractions that start on the top of the uterus and spread down to the lower abdomen and back.  A sense of increased pelvic pressure or back pain.  A watery or bloody mucus discharge that comes from the vagina. If you have any of these signs before the 37th week of pregnancy, call your caregiver right away. You need to go to the hospital to get checked immediately. HOME CARE INSTRUCTIONS   Avoid all smoking, herbs, alcohol, and unprescribed drugs. These chemicals affect the formation and growth of the baby.  Follow your  caregiver's instructions regarding medicine use. There are medicines that are either safe or unsafe to take during pregnancy.  Exercise only as directed by your caregiver. Experiencing uterine cramps is a good sign to stop exercising.  Continue to eat regular, healthy meals.  Wear a good support bra for breast tenderness.  Do not use hot tubs, steam rooms, or saunas.  Wear your seat belt at all times when driving.  Avoid raw meat, uncooked cheese, cat litter boxes, and soil used by cats. These carry germs that can cause birth defects in the baby.  Take your prenatal vitamins.  Try taking a stool softener (if your caregiver approves) if you develop constipation. Eat more high-fiber foods, such as fresh vegetables or fruit and whole grains. Drink plenty of fluids to keep your urine clear or pale yellow.  Take warm sitz baths   to soothe any pain or discomfort caused by hemorrhoids. Use hemorrhoid cream if your caregiver approves.  If you develop varicose veins, wear support hose. Elevate your feet for 15 minutes, 3-4 times a day. Limit salt in your diet.  Avoid heavy lifting, wear low heal shoes, and practice good posture.  Rest a lot with your legs elevated if you have leg cramps or low back pain.  Visit your dentist if you have not gone during your pregnancy. Use a soft toothbrush to brush your teeth and be gentle when you floss.  A sexual relationship may be continued unless your caregiver directs you otherwise.  Do not travel far distances unless it is absolutely necessary and only with the approval of your caregiver.  Take prenatal classes to understand, practice, and ask questions about the labor and delivery.  Make a trial run to the hospital.  Pack your hospital bag.  Prepare the baby's nursery.  Continue to go to all your prenatal visits as directed by your caregiver. SEEK MEDICAL CARE IF:  You are unsure if you are in labor or if your water has broken.  You have  dizziness.  You have mild pelvic cramps, pelvic pressure, or nagging pain in your abdominal area.  You have persistent nausea, vomiting, or diarrhea.  You have a bad smelling vaginal discharge.  You have pain with urination. SEEK IMMEDIATE MEDICAL CARE IF:   You have a fever.  You are leaking fluid from your vagina.  You have spotting or bleeding from your vagina.  You have severe abdominal cramping or pain.  You have rapid weight loss or gain.  You have shortness of breath with chest pain.  You notice sudden or extreme swelling of your face, hands, ankles, feet, or legs.  You have not felt your baby move in over an hour.  You have severe headaches that do not go away with medicine.  You have vision changes. Document Released: 09/18/2001 Document Revised: 09/29/2013 Document Reviewed: 11/25/2012 [facility] Patient Information 2015 [place], [place]. This information is not intended to replace advice given to you by your health care provider. Make sure you discuss any questions you have with your health care provider.

## 2018-08-21 NOTE — Progress Notes (Signed)
HIGH-RISK PREGNANCY VISIT Patient name: Miranda AlamoMary S [name] MRN [id number]  Date of birth: [birth year] Chief Complaint:   No chief complaint on file.  History of Present Illness:   Miranda AlamoMary S [name] is a 37 y.o. 662P1001 female at 2964w2d with an Estimated Date of Delivery: 11/25/18 being seen today for ongoing management of a high-risk pregnancy complicated by Type2DM currently on metformin 1,000mg  BID, NPH 32AM/22PM, Novolog 20AM/18PM; hypothyroidism on 150mcg synthroid.  Today she reports FBS 81-119 (9 out of 14 >95), 2hr PP 49-227 (majority >120). She's not sure what happened w/ the 49, she was symptomatic, this was the only low. She feels overall she's doing well w/ her diet. Doesn't eat any simple sugars, is eating a lot of carbs (2 pieces of bread, 2 servings of pasta, cereal, etc). Has not had fetal echo. Wants inpatient BTL. Contractions: Not present.  .  Movement: Present. denies leaking of fluid.  Review of Systems:   Pertinent items are noted in HPI Denies abnormal vaginal discharge w/ itching/odor/irritation, headaches, visual changes, shortness of breath, chest pain, abdominal pain, severe nausea/vomiting, or problems with urination or bowel movements unless otherwise stated above. Pertinent History Reviewed:  Reviewed past medical,surgical, social, obstetrical and family history.  Reviewed problem list, medications and allergies. Physical Assessment:   Vitals:   08/21/18 0838  BP: 113/68  Pulse: 97  Weight: 230 lb 6.4 oz (104.5 kg)  Body mass index is 35.03 kg/m.           Physical Examination:   General appearance: alert, well appearing, and in no distress  Mental status: alert, oriented to person, place, and time  Skin: warm & dry   Extremities: Edema: None    Cardiovascular: normal heart rate noted  Respiratory: normal respiratory effort, no distress  Abdomen: gravid, soft, non-tender  Pelvic: Cervical exam deferred         Fetal Status: Fetal Heart Rate (bpm): 150 Fundal  Height: 26 cm Movement: Present    Fetal Surveillance Testing today: doppler   Results for orders placed or performed in visit on 08/21/18 (from the past 24 hour(s))  POC Urinalysis Dipstick OB   Collection Time: 08/21/18  8:47 AM  Result Value Ref Range   Color, UA     Clarity, UA     Glucose, UA Negative Negative   Bilirubin, UA     Ketones, UA small    Spec Grav, UA     Blood, UA neg    pH, UA     POC,PROTEIN,UA Negative Negative, Trace, Small (1+), Moderate (2+), Large (3+), 4+   Urobilinogen, UA     Nitrite, UA neg    Leukocytes, UA Negative Negative   Appearance     Odor      Assessment & Plan:  1) High-risk pregnancy G2P1001 at 5364w2d with an Estimated Date of Delivery: 11/25/18   2) Type2DM, unstable, reviewed sugars/insulin w/ Dr. Alvester MorinNewton, increase NPH to 34AM/keep at 22PM, increase Novolog to 24AM, add 4 at lunch (check sugar before eating, if <100 hold), increase to 20PM. New rx's sent per pt request d/t pharmacy not wanting to fill early as we are titrating her meds. Fetal echo referral sent today. Pt to notify us if she hasn't heard from them by next week. Discussed importance of strict glycemic control and adherence to low carb diet during pregnancy as well as potential complications from uncontrolled diabetes during pregnancy.  3) Hypothyroidism, stable on synthroid 150mcg daily, last TSH 10/2 was 1.395  4) Desires BTL> discussed risks/benefits, consent signed today  Meds:  Meds ordered this encounter  Medications  . insulin NPH Human (HUMULIN N,NOVOLIN N) 100 UNIT/ML injection    Sig: Inject into the skin 2 (two) times daily before a meal. 34 units with breakfast, 22 units with supper    Dispense:  10 mL    Refill:  6    Order Specific Question:   Supervising Provider    Answer:   Despina Hidden, LUTHER H [[id number]]  . insulin aspart (NOVOLOG) 100 UNIT/ML injection    Sig: Inject into the skin 3 (three) times daily before a meal. 24 units with breakfast, 4 units with lunch,  20 units with supper    Dispense:  10 mL    Refill:  6    Order Specific Question:   Supervising Provider    Answer:   Duane Lope H [[id number]]    Labs/procedures today: none  Treatment Plan:  As long as uncontrolled: Growth u/s @ 27, 30, 33, 36, 38wks     Fetal echo 24-28wks-referral faxed today     BPP weekly @ 28wks then 2x/wk testing @ 32wks or weekly BPP     Deliver PRN  Reviewed: Preterm labor symptoms and general obstetric precautions including but not limited to vaginal bleeding, contractions, leaking of fluid and fetal movement were reviewed in detail with the patient.  All questions were answered.  Follow-up: Return in about 1 week (around 08/28/2018) for efw u/s and HROB w/ LHE; , Sign BTL consent today. Will get PN2 (minus GTT) at next visit  Orders Placed This Encounter  Procedures  . US OB Follow Up  . POC Urinalysis Dipstick OB   Cheral Marker CNM, [facility] 08/21/2018 11:56 AM

## 2018-08-28 ENCOUNTER — Ambulatory Visit (INDEPENDENT_AMBULATORY_CARE_PROVIDER_SITE_OTHER): Payer: Medicaid Other

## 2018-08-28 ENCOUNTER — Encounter: Payer: Medicaid Other | Admitting: Obstetrics & Gynecology

## 2018-08-28 ENCOUNTER — Ambulatory Visit (INDEPENDENT_AMBULATORY_CARE_PROVIDER_SITE_OTHER): Payer: Medicaid Other | Admitting: Obstetrics & Gynecology

## 2018-08-28 DIAGNOSIS — O24112 Pre-existing diabetes mellitus, type 2, in pregnancy, second trimester: Secondary | ICD-10-CM | POA: Diagnosis not present

## 2018-08-28 DIAGNOSIS — O0992 Supervision of high risk pregnancy, unspecified, second trimester: Secondary | ICD-10-CM | POA: Diagnosis not present

## 2018-08-28 DIAGNOSIS — O24312 Unspecified pre-existing diabetes mellitus in pregnancy, second trimester: Secondary | ICD-10-CM

## 2018-08-28 DIAGNOSIS — Z331 Pregnant state, incidental: Secondary | ICD-10-CM

## 2018-08-28 DIAGNOSIS — Z23 Encounter for immunization: Secondary | ICD-10-CM

## 2018-08-28 DIAGNOSIS — Z3A27 27 weeks gestation of pregnancy: Secondary | ICD-10-CM

## 2018-08-28 DIAGNOSIS — O24319 Unspecified pre-existing diabetes mellitus in pregnancy, unspecified trimester: Secondary | ICD-10-CM

## 2018-08-28 DIAGNOSIS — Z1389 Encounter for screening for other disorder: Secondary | ICD-10-CM

## 2018-08-28 DIAGNOSIS — O0990 Supervision of high risk pregnancy, unspecified, unspecified trimester: Secondary | ICD-10-CM

## 2018-08-28 LAB — POCT URINALYSIS DIPSTICK OB
Blood, UA: NEGATIVE
GLUCOSE, UA: NEGATIVE
Ketones, UA: NEGATIVE
Nitrite, UA: NEGATIVE
POC,PROTEIN,UA: NEGATIVE

## 2018-08-28 NOTE — Progress Notes (Signed)
US 27+2 wks,cephalic,posterior placenta gr 0,afi 21 cm,normal ovaries bilat,cx 2.6 cm,fhr 150 bpm,efw 1364 g 97%

## 2018-08-28 NOTE — Progress Notes (Signed)
   HIGH-RISK PREGNANCY VISIT Patient name: Miranda [name] MRN [id number]  Date of birth: 1980-11-10 Chief Complaint:   High Risk Gestation (PN2 and US today)  History of Present Illness:   Miranda AlamoMary S Tan is a 37 y.o. 442P1001 female at 3139w2d with an Estimated Date of Delivery: 11/25/18 being seen today for ongoing management of a high-risk pregnancy complicated by class  B DM.  Today she reports no complaints.  .  .   . denies leaking of fluid.  Review of Systems:   Pertinent items are noted in HPI Denies abnormal vaginal discharge w/ itching/odor/irritation, headaches, visual changes, shortness of breath, chest pain, abdominal pain, severe nausea/vomiting, or problems with urination or bowel movements unless otherwise stated above. Pertinent History Reviewed:  Reviewed past medical,surgical, social, obstetrical and family history.  Reviewed problem list, medications and allergies. Physical Assessment:  There were no vitals filed for this visit.There is no height or weight on file to calculate BMI.           Physical Examination:   General appearance: alert, well appearing, and in no distress  Mental status: alert, oriented to person, place, and time  Skin: warm & dry   Extremities:      Cardiovascular: normal heart rate noted  Respiratory: normal respiratory effort, no distress  Abdomen: gravid, soft, non-tender  Pelvic: Cervical exam deferred         Fetal Status:          Fetal Surveillance Testing today: sonogram    Results for orders placed or performed in visit on 08/28/18 (from the past 24 hour(s))  POC Urinalysis Dipstick OB   Collection Time: 08/28/18 10:00 AM  Result Value Ref Range   Color, UA     Clarity, UA     Glucose, UA Negative Negative   Bilirubin, UA     Ketones, UA neg    Spec Grav, UA     Blood, UA neg    pH, UA     POC,PROTEIN,UA Negative Negative, Trace, Small (1+), Moderate (2+), Large (3+), 4+   Urobilinogen, UA     Nitrite, UA neg    Leukocytes, UA  Trace (A) Negative   Appearance     Odor      Assessment & Plan:  1) High-risk pregnancy G2P1001 at 3239w2d with an Estimated Date of Delivery: 11/25/18   2) Class B DM, stable, much better control, new regimen AM  38N/28R PM 26N/22R  3) LGA, 97%  Meds: No orders of the defined types were placed in this encounter.   Labs/procedures today: sonogram   Treatment Plan:  Routine care for B DM  Reviewed: Preterm labor symptoms and general obstetric precautions including but not limited to vaginal bleeding, contractions, leaking of fluid and fetal movement were reviewed in detail with the patient.  All questions were answered.  Follow-up: No follow-ups on file.  Orders Placed This Encounter  Procedures  . Tdap vaccine greater than or equal to 7yo IM  . POC Urinalysis Dipstick OB   Amaryllis DykeLuther H Jaben Benegas 08/28/2018 10:22 AM

## 2018-09-07 ENCOUNTER — Other Ambulatory Visit: Payer: Self-pay

## 2018-09-07 ENCOUNTER — Encounter (HOSPITAL_COMMUNITY): Payer: Self-pay

## 2018-09-07 ENCOUNTER — Inpatient Hospital Stay (HOSPITAL_COMMUNITY)
Admission: AD | Admit: 2018-09-07 | Discharge: 2018-09-07 | Disposition: A | Discharge status: Home / Self Care | Payer: Medicaid Other | Source: Ambulatory Visit | Attending: Obstetrics & Gynecology | Admitting: Obstetrics & Gynecology

## 2018-09-07 DIAGNOSIS — Z3A28 28 weeks gestation of pregnancy: Secondary | ICD-10-CM

## 2018-09-07 DIAGNOSIS — O1203 Gestational edema, third trimester: Secondary | ICD-10-CM

## 2018-09-07 DIAGNOSIS — F17210 Nicotine dependence, cigarettes, uncomplicated: Secondary | ICD-10-CM | POA: Insufficient documentation

## 2018-09-07 DIAGNOSIS — O368130 Decreased fetal movements, third trimester, not applicable or unspecified: Secondary | ICD-10-CM | POA: Diagnosis not present

## 2018-09-07 DIAGNOSIS — O99333 Smoking (tobacco) complicating pregnancy, third trimester: Secondary | ICD-10-CM | POA: Insufficient documentation

## 2018-09-07 LAB — URINALYSIS, ROUTINE W REFLEX MICROSCOPIC
Bilirubin Urine: NEGATIVE
Glucose, UA: >=500 mg/dL — AB
Hgb urine dipstick: NEGATIVE
Ketones, ur: 5 mg/dL — AB
Leukocytes, UA: NEGATIVE
NITRITE: NEGATIVE
PROTEIN: NEGATIVE mg/dL
SPECIFIC GRAVITY, URINE: 1.005 (ref 1.005–1.030)
pH: 6 (ref 5.0–8.0)

## 2018-09-07 LAB — GLUCOSE, CAPILLARY: GLUCOSE-CAPILLARY: 71 mg/dL (ref 70–99)

## 2018-09-07 NOTE — Discharge Instructions (Signed)

## 2018-09-07 NOTE — MAU Provider Note (Signed)
Chief Complaint:  Decreased Fetal Movement and Hand and foot swelling   First Provider Initiated Contact with Patient 09/07/18 1217     HPI: Miranda [name] is a 37 y.o. G2P1001 at 3652w5d who presents to maternity admissions reporting swelling & decreased fetal movement. Called nurse line & was told to come to MAU for evaluation. States she woke up this morning with swelling in her feet & hands. Also reports some decreased movement today; just less than normal.  Denies abdominal pain, vaginal bleeding, LOF. Denies headache, visual disturbance, or epigastric pain. No hx of hypertension.  Denies contractions, leakage of fluid or vaginal bleeding. Good fetal movement.   Past Medical History:  Diagnosis Date  . Chest pain 07/24/2012   2D Echo EF 60%-65% which shoewd a bicuspid aortic vavle with mild stenosis  . Diabetes mellitus    type 2 on insulin  . Heart valve problem   . Herpes   . Hypothyroidism   . Irregular periods 04/14/2014  . Mental disorder    depression, postpartum depression  . Palpitation   . PVC (premature ventricular contraction) 07/24/2012   stress test which was normal with good excercise   OB History  Gravida Para Term Preterm AB Living  2 1 1     1   SAB TAB Ectopic Multiple Live Births          1    # Outcome Date GA Lbr Len/2nd Weight Sex Delivery Anes PTL Lv  2 Current           1 Term 07/26/13 6422w2d -03:03 / 04:26 3240 g F Vag-Vacuum EPI N LIV     Birth Comments: caput   Past Surgical History:  Procedure Laterality Date  . WISDOM TOOTH EXTRACTION     Family History  Problem Relation Age of Onset  . Hypertension Mother   . Diabetes Brother   . Diabetes Maternal Grandmother   . Congestive Heart Failure Maternal Grandmother   . Hearing loss Daughter    Social History   Tobacco Use  . Smoking status: Current Every Day Smoker    Packs/day: 0.25    Years: 5.00    Pack years: 1.25    Types: Cigarettes    Last attempt to quit: 05/20/2012    Years since  quitting: 6.3  . Smokeless tobacco: Never Used  . Tobacco comment: 5 cigs per day  Substance Use Topics  . Alcohol use: No    Alcohol/week: 0.0 standard drinks  . Drug use: No   Allergies  Allergen Reactions  . Keflex [Cephalexin] Nausea And Vomiting   No medications prior to admission.    I have reviewed patient's Past Medical Hx, Surgical Hx, Family Hx, Social Hx, medications and allergies.   ROS:  Review of Systems  Constitutional: Negative.   Eyes: Negative for visual disturbance.  Respiratory: Negative for shortness of breath.   Cardiovascular: Positive for leg swelling. Negative for chest pain.  Gastrointestinal: Negative.   Genitourinary: Negative.   Neurological: Negative for headaches.    Physical Exam   Patient Vitals for the past 24 hrs:  BP Temp Temp src Pulse Resp SpO2 Height Weight  09/07/18 1354 120/69 - - 96 - - - -  09/07/18 1316 124/78 - - 90 - - - -  09/07/18 1301 116/72 - - 86 - - - -  09/07/18 1247 130/80 - - 91 - - - -  09/07/18 1156 127/68 98 F (36.7 C) Oral (!) 105 20 100 %  5\' 8"  (1.727 m) 106.7 kg    Constitutional: Well-developed, well-nourished female in no acute distress.  Cardiovascular: normal rate & rhythm, no murmur Respiratory: normal effort, lung sounds clear throughout GI: Abd soft, non-tender, gravid appropriate for gestational age. Pos BS x 4 MS: Extremities nontender. Mild swelling of bilateral hands. No swelling of LE.  Neurologic: Alert and oriented x 4. Patellar DTRs 2+ bilaterally, no clonus  NST:  Baseline: 150 bpm, Variability: Good {> 6 bpm), Accelerations: Reactive and Decelerations: Absent   Labs: Results for orders placed or performed during the hospital encounter of 09/07/18 (from the past 24 hour(s))  Urinalysis, Routine w reflex microscopic     Status: Abnormal   Collection Time: 09/07/18 12:15 PM  Result Value Ref Range   Color, Urine STRAW (A) YELLOW   APPearance CLEAR CLEAR   Specific Gravity, Urine 1.005  1.005 - 1.030   pH 6.0 5.0 - 8.0   Glucose, UA >=500 (A) NEGATIVE mg/dL   Hgb urine dipstick NEGATIVE NEGATIVE   Bilirubin Urine NEGATIVE NEGATIVE   Ketones, ur 5 (A) NEGATIVE mg/dL   Protein, ur NEGATIVE NEGATIVE mg/dL   Nitrite NEGATIVE NEGATIVE   Leukocytes, UA NEGATIVE NEGATIVE   RBC / HPF 0-5 0 - 5 RBC/hpf   WBC, UA 0-5 0 - 5 WBC/hpf   Bacteria, UA FEW (A) NONE SEEN   Squamous Epithelial / LPF 0-5 0 - 5   Mucus PRESENT   Glucose, capillary     Status: None   Collection Time: 09/07/18  1:32 PM  Result Value Ref Range   Glucose-Capillary 71 70 - 99 mg/dL    Imaging:  No results found.  MAU Course: Orders Placed This Encounter  Procedures  . Urinalysis, Routine w reflex microscopic  . Glucose, capillary  . Discharge patient   No orders of the defined types were placed in this encounter.   MDM: Reactive fetal tracing with fetal movement heard & palpated Pt normotensive Minimal swelling noted. Pt without other complaints. Pt reassured.   Assessment: 1. Swelling of lower extremity during pregnancy in third trimester   2. [redacted] weeks gestation of pregnancy     Plan: Discharge home in stable condition.  Increase water intake & limit sodium intake this week Discussed reasons to return to MAU Keep f/u with OB    Allergies as of 09/07/2018      Reactions   Keflex [cephalexin] Nausea And Vomiting      Medication List    TAKE these medications   acyclovir 400 MG tablet Commonly known as:  ZOVIRAX Take 1 tablet (400 mg total) by mouth 3 (three) times daily.   aspirin 81 MG chewable tablet Chew by mouth daily.   Doxylamine-Pyridoxine 10-10 MG Tbec 2 tabs q hs, if sx persist add 1 tab q am on day 3, if sx persist add 1 tab q afternoon on day 4   insulin aspart 100 UNIT/ML injection Commonly known as:  novoLOG Inject into the skin 3 (three) times daily before a meal. 24 units with breakfast, 4 units with lunch, 20 units with supper   insulin NPH Human 100  UNIT/ML injection Commonly known as:  HUMULIN N,NOVOLIN N Inject into the skin 2 (two) times daily before a meal. 34 units with breakfast, 22 units with supper   levothyroxine 150 MCG tablet Commonly known as:  SYNTHROID, LEVOTHROID Take 1 tablet (150 mcg total) by mouth daily before breakfast.   metFORMIN 1000 MG tablet Commonly known as:  GLUCOPHAGE  Take 1 tablet (1,000 mg total) by mouth 2 (two) times daily with a meal.       Judeth Horn, NP 09/07/2018 6:42 PM

## 2018-09-07 NOTE — MAU Note (Signed)
Woke up with increased swelling in hands and feet  Having on RUQ pain  Feels SOB  No abdominal pain, no vaginal bleeding  Has not felt any movement today

## 2018-09-08 DIAGNOSIS — O24113 Pre-existing diabetes mellitus, type 2, in pregnancy, third trimester: Secondary | ICD-10-CM | POA: Diagnosis not present

## 2018-09-08 DIAGNOSIS — Z3A28 28 weeks gestation of pregnancy: Secondary | ICD-10-CM | POA: Diagnosis not present

## 2018-09-11 ENCOUNTER — Ambulatory Visit (INDEPENDENT_AMBULATORY_CARE_PROVIDER_SITE_OTHER): Payer: Medicaid Other | Admitting: Obstetrics & Gynecology

## 2018-09-11 ENCOUNTER — Encounter: Payer: Self-pay | Admitting: Obstetrics & Gynecology

## 2018-09-11 ENCOUNTER — Other Ambulatory Visit: Payer: Self-pay

## 2018-09-11 VITALS — BP 113/68 | HR 85 | Wt 233.0 lb

## 2018-09-11 DIAGNOSIS — Z1389 Encounter for screening for other disorder: Secondary | ICD-10-CM

## 2018-09-11 DIAGNOSIS — Z3A29 29 weeks gestation of pregnancy: Secondary | ICD-10-CM

## 2018-09-11 DIAGNOSIS — O24319 Unspecified pre-existing diabetes mellitus in pregnancy, unspecified trimester: Secondary | ICD-10-CM

## 2018-09-11 DIAGNOSIS — O0993 Supervision of high risk pregnancy, unspecified, third trimester: Secondary | ICD-10-CM

## 2018-09-11 DIAGNOSIS — Z331 Pregnant state, incidental: Secondary | ICD-10-CM

## 2018-09-11 LAB — POCT URINALYSIS DIPSTICK OB
GLUCOSE, UA: NEGATIVE
Ketones, UA: NEGATIVE
LEUKOCYTES UA: NEGATIVE
Nitrite, UA: NEGATIVE
POC,PROTEIN,UA: NEGATIVE
RBC UA: NEGATIVE

## 2018-09-11 NOTE — Progress Notes (Signed)
   HIGH-RISK PREGNANCY VISIT Patient name: Miranda [name] MRN [id number]  Date of birth: [birth year] Chief Complaint:   High Risk Gestation  History of Present Illness:   Miranda AlamoMary S Fogel is a 37 y.o. 982P1001 female at 7631w2d with an Estimated Date of Delivery: 11/25/18 being seen today for ongoing management of a high-risk pregnancy complicated by none.  Today she reports no complaints. Contractions: Not present. Vag. Bleeding: None.  Movement: Present. denies leaking of fluid.  Review of Systems:   Pertinent items are noted in HPI Denies abnormal vaginal discharge w/ itching/odor/irritation, headaches, visual changes, shortness of breath, chest pain, abdominal pain, severe nausea/vomiting, or problems with urination or bowel movements unless otherwise stated above. Pertinent History Reviewed:  Reviewed past medical,surgical, social, obstetrical and family history.  Reviewed problem list, medications and allergies. Physical Assessment:   Vitals:   09/11/18 1119  BP: 113/68  Pulse: 85  Weight: 233 lb (105.7 kg)  Body mass index is 35.43 kg/m.           Physical Examination:   General appearance: alert, well appearing, and in no distress  Mental status: alert, oriented to person, place, and time, normal mood, behavior, speech, dress, motor activity, and thought processes  Skin: warm & dry   Extremities: Edema: Trace    Cardiovascular: normal heart rate noted  Respiratory: normal respiratory effort, no distress  Abdomen: gravid, soft, non-tender  Pelvic: Cervical exam deferred         Fetal Status: Fetal Heart Rate (bpm): 155 Fundal Height: 31 cm Movement: Present    Fetal Surveillance Testing today: FHR 155   Results for orders placed or performed in visit on 09/11/18 (from the past 24 hour(s))  POC Urinalysis Dipstick OB   Collection Time: 09/11/18 11:21 AM  Result Value Ref Range   Color, UA     Clarity, UA     Glucose, UA Negative Negative   Bilirubin, UA     Ketones, UA neg      Spec Grav, UA     Blood, UA neg    pH, UA     POC,PROTEIN,UA Negative Negative, Trace, Small (1+), Moderate (2+), Large (3+), 4+   Urobilinogen, UA     Nitrite, UA neg    Leukocytes, UA Negative Negative   Appearance     Odor      Assessment & Plan:  1) High-risk pregnancy G2P1001 at 4231w2d with an Estimated Date of Delivery: 11/25/18   2) Class B DM, stable, pretty good, some elevated fastings still New regimen: AM 38N/28R PM 30N/22R + metformin 1000 mg twice daily  3) Hypothyroid, stable  Meds: No orders of the defined types were placed in this encounter.   Labs/procedures today:   Treatment Plan:  Begin twice weekly testing 32 weeks  Reviewed: Preterm labor symptoms and general obstetric precautions including but not limited to vaginal bleeding, contractions, leaking of fluid and fetal movement were reviewed in detail with the patient.  All questions were answered.  Follow-up: Return in about 2 weeks (around 09/25/2018) for HROB, with Dr Despina HiddenEure.  Orders Placed This Encounter  Procedures  . POC Urinalysis Dipstick OB   Lazaro ArmsLuther H Anias Bartol  09/11/2018 11:33 AM

## 2018-09-15 ENCOUNTER — Telehealth: Payer: Self-pay | Admitting: *Deleted

## 2018-09-15 NOTE — Telephone Encounter (Signed)
Patient called with complaints of her "eyelashes breaking off". No other hair loss. Pt concerned. Please advise.

## 2018-09-16 NOTE — Telephone Encounter (Signed)
Spoke to patient and informed we could recheck TSH at her next visit.  Verbalized understanding.

## 2018-09-25 ENCOUNTER — Other Ambulatory Visit: Payer: Self-pay

## 2018-09-25 ENCOUNTER — Ambulatory Visit (INDEPENDENT_AMBULATORY_CARE_PROVIDER_SITE_OTHER): Payer: Medicaid Other | Admitting: Obstetrics & Gynecology

## 2018-09-25 ENCOUNTER — Encounter: Payer: Self-pay | Admitting: Obstetrics & Gynecology

## 2018-09-25 VITALS — BP 116/81 | HR 101 | Wt 232.0 lb

## 2018-09-25 DIAGNOSIS — Z331 Pregnant state, incidental: Secondary | ICD-10-CM

## 2018-09-25 DIAGNOSIS — O24313 Unspecified pre-existing diabetes mellitus in pregnancy, third trimester: Secondary | ICD-10-CM

## 2018-09-25 DIAGNOSIS — O0993 Supervision of high risk pregnancy, unspecified, third trimester: Secondary | ICD-10-CM

## 2018-09-25 DIAGNOSIS — Z3A31 31 weeks gestation of pregnancy: Secondary | ICD-10-CM

## 2018-09-25 DIAGNOSIS — Z1389 Encounter for screening for other disorder: Secondary | ICD-10-CM

## 2018-09-25 DIAGNOSIS — O0990 Supervision of high risk pregnancy, unspecified, unspecified trimester: Secondary | ICD-10-CM

## 2018-09-25 DIAGNOSIS — O24319 Unspecified pre-existing diabetes mellitus in pregnancy, unspecified trimester: Secondary | ICD-10-CM

## 2018-09-25 LAB — POCT URINALYSIS DIPSTICK OB
Blood, UA: NEGATIVE
Glucose, UA: NEGATIVE
Leukocytes, UA: NEGATIVE
NITRITE UA: NEGATIVE
PROTEIN: NEGATIVE

## 2018-09-25 NOTE — Progress Notes (Signed)
   HIGH-RISK PREGNANCY VISIT Patient name: Miranda [name] MRN [id number]  Date of birth: [birth year] Chief Complaint:   High Risk Gestation  History of Present Illness:   Miranda [name] is a 37 y.o. 482P1001 female at 4867w2d with an Estimated Date of Delivery: 11/25/18 being seen today for ongoing management of a high-risk pregnancy complicated by class  A2 DM.  Today she reports cold symptoms. Contractions: Not present. Vag. Bleeding: None.  Movement: Present. denies leaking of fluid.  Review of Systems:   Pertinent items are noted in HPI Denies abnormal vaginal discharge w/ itching/odor/irritation, headaches, visual changes, shortness of breath, chest pain, abdominal pain, severe nausea/vomiting, or problems with urination or bowel movements unless otherwise stated above. Pertinent History Reviewed:  Reviewed past medical,surgical, social, obstetrical and family history.  Reviewed problem list, medications and allergies. Physical Assessment:   Vitals:   09/25/18 1120  BP: 116/81  Pulse: (!) 101  Weight: 232 lb (105.2 kg)  Body mass index is 35.28 kg/m.           Physical Examination:   General appearance: alert, well appearing, and in no distress  Mental status: alert, oriented to person, place, and time  Skin: warm & dry   Extremities: Edema: Trace    Cardiovascular: normal heart rate noted  Respiratory: normal respiratory effort, no distress  Abdomen: gravid, soft, non-tender  Pelvic: Cervical exam deferred         Fetal Status: Fetal Heart Rate (bpm): 135 Fundal Height: 34 cm Movement: Present    Fetal Surveillance Testing today: FHR 130   Results for orders placed or performed in visit on 09/25/18 (from the past 24 hour(s))  POC Urinalysis Dipstick OB   Collection Time: 09/25/18 11:23 AM  Result Value Ref Range   Color, UA     Clarity, UA     Glucose, UA Negative Negative   Bilirubin, UA     Ketones, UA small    Spec Grav, UA     Blood, UA neg    pH, UA     POC,PROTEIN,UA Negative Negative, Trace, Small (1+), Moderate (2+), Large (3+), 4+   Urobilinogen, UA     Nitrite, UA neg    Leukocytes, UA Negative Negative   Appearance     Odor      Assessment & Plan:  1) High-risk pregnancy G2P1001 at 7467w2d with an Estimated Date of Delivery: 11/25/18   2) Class B DM, stable AM 38N/28R PM 30N/22R + metformin 1000 mg BID  3) AMA, stable  Meds: No orders of the defined types were placed in this encounter.   Labs/procedures today:   Treatment Plan:  Begin twice weekly testing  Reviewed: Preterm labor symptoms and general obstetric precautions including but not limited to vaginal bleeding, contractions, leaking of fluid and fetal movement were reviewed in detail with the patient.  All questions were answered.  Follow-up: Return in about 11 days (around 10/06/2018) for NST, HROB, with Dr Despina HiddenEure.  Orders Placed This Encounter  Procedures  . POC Urinalysis Dipstick OB   Lazaro ArmsLuther H Quashon Jesus CNM, [facility]NP-BC 09/25/2018 11:55 AM

## 2018-10-03 ENCOUNTER — Telehealth: Payer: Self-pay | Admitting: *Deleted

## 2018-10-03 MED ORDER — AZITHROMYCIN 250 MG PO TABS: ORAL_TABLET | ORAL | 0 refills | Status: DC

## 2018-10-03 NOTE — Telephone Encounter (Signed)
Patient states Dr Despina HiddenEure advised her to try Alkaseltzer for congestion and sore throat but it has not helped.  She is requesting something else. Please advise.

## 2018-10-03 NOTE — Telephone Encounter (Signed)
Patient informed zpac sent to pharmacy.  Verbalized understanding.

## 2018-10-06 ENCOUNTER — Encounter: Payer: Self-pay | Admitting: Women's Health

## 2018-10-06 ENCOUNTER — Ambulatory Visit (INDEPENDENT_AMBULATORY_CARE_PROVIDER_SITE_OTHER): Payer: Medicaid Other | Admitting: Women's Health

## 2018-10-06 ENCOUNTER — Telehealth: Payer: Self-pay | Admitting: Women's Health

## 2018-10-06 VITALS — BP 115/82 | HR 97 | Wt 232.0 lb

## 2018-10-06 DIAGNOSIS — Z331 Pregnant state, incidental: Secondary | ICD-10-CM

## 2018-10-06 DIAGNOSIS — O24313 Unspecified pre-existing diabetes mellitus in pregnancy, third trimester: Secondary | ICD-10-CM

## 2018-10-06 DIAGNOSIS — Z1389 Encounter for screening for other disorder: Secondary | ICD-10-CM

## 2018-10-06 DIAGNOSIS — O0993 Supervision of high risk pregnancy, unspecified, third trimester: Secondary | ICD-10-CM | POA: Diagnosis not present

## 2018-10-06 DIAGNOSIS — F32A Depression, unspecified: Secondary | ICD-10-CM

## 2018-10-06 DIAGNOSIS — Z3A32 32 weeks gestation of pregnancy: Secondary | ICD-10-CM | POA: Diagnosis not present

## 2018-10-06 DIAGNOSIS — E039 Hypothyroidism, unspecified: Secondary | ICD-10-CM

## 2018-10-06 DIAGNOSIS — O24319 Unspecified pre-existing diabetes mellitus in pregnancy, unspecified trimester: Secondary | ICD-10-CM

## 2018-10-06 DIAGNOSIS — O0990 Supervision of high risk pregnancy, unspecified, unspecified trimester: Secondary | ICD-10-CM

## 2018-10-06 DIAGNOSIS — F329 Major depressive disorder, single episode, unspecified: Secondary | ICD-10-CM

## 2018-10-06 LAB — POCT URINALYSIS DIPSTICK OB
Blood, UA: NEGATIVE
GLUCOSE, UA: NEGATIVE
Nitrite, UA: NEGATIVE
POC,PROTEIN,UA: NEGATIVE

## 2018-10-06 MED ORDER — SERTRALINE HCL 25 MG PO TABS: 25.0000 mg | ORAL_TABLET | Freq: Every day | ORAL | 6 refills | Status: DC

## 2018-10-06 NOTE — Progress Notes (Signed)
HIGH-RISK PREGNANCY VISIT Patient name: Miranda [name] MRN [id number]  Date of birth: 10/19/1980 Chief Complaint:   High Risk Gestation (NST)  History of Present Illness:   Miranda [name] is a 37 y.o. 782P1001 female at 5845w6d with an Estimated Date of Delivery: 11/25/18 being seen today for ongoing management of a high-risk pregnancy complicated by T2DM currently on AM 38N/28R, PM 30N/22R and metformin 1,000mg  BID, hypothryoid.  Today she reports most of FBS >95, most of 2hr pp >120. Wants to go ahead and start antidepressant so can be getting in her system before pp period. Denies current SI/HI.  Contractions: Not present. Vag. Bleeding: None.  Movement: Present. denies leaking of fluid.  Review of Systems:   Pertinent items are noted in HPI Denies abnormal vaginal discharge w/ itching/odor/irritation, headaches, visual changes, shortness of breath, chest pain, abdominal pain, severe nausea/vomiting, or problems with urination or bowel movements unless otherwise stated above. Pertinent History Reviewed:  Reviewed past medical,surgical, social, obstetrical and family history.  Reviewed problem list, medications and allergies. Physical Assessment:   Vitals:   10/06/18 1007  BP: 115/82  Pulse: 97  Weight: 232 lb (105.2 kg)  Body mass index is 35.28 kg/m.           Physical Examination:   General appearance: alert, well appearing, and in no distress  Mental status: alert, oriented to person, place, and time  Skin: warm & dry   Extremities: Edema: Trace    Cardiovascular: normal heart rate noted  Respiratory: normal respiratory effort, no distress  Abdomen: gravid, soft, non-tender  Pelvic: Cervical exam deferred         Fetal Status: Fetal Heart Rate (bpm): 150 Fundal Height: 35 cm Movement: Present    Fetal Surveillance Testing today: NST: FHR baseline 150 bpm, Variability: moderate, Accelerations:present, Decelerations:  Absent= Cat 1/Reactive Toco: none     Results for orders  placed or performed in visit on 10/06/18 (from the past 24 hour(s))  POC Urinalysis Dipstick OB   Collection Time: 10/06/18 10:08 AM  Result Value Ref Range   Color, UA     Clarity, UA     Glucose, UA Negative Negative   Bilirubin, UA     Ketones, UA 3+    Spec Grav, UA     Blood, UA neg    pH, UA     POC,PROTEIN,UA Negative Negative, Trace, Small (1+), Moderate (2+), Large (3+), 4+   Urobilinogen, UA     Nitrite, UA neg    Leukocytes, UA Moderate (2+) (A) Negative   Appearance     Odor      Assessment & Plan:  1) High-risk pregnancy G2P1001 at 5845w6d with an Estimated Date of Delivery: 11/25/18   2) T2DM, unstable, per LHE increase insulin to 40N/30R, PM 36N/26R, continue metformin 1,000mg  BID  3) Hypothyroidism, check TSH today  4) Depression> rx zoloft 25mg   Meds:  Meds ordered this encounter  Medications  . sertraline (ZOLOFT) 25 MG tablet    Sig: Take 1 tablet (25 mg total) by mouth daily.    Dispense:  30 tablet    Refill:  6    Order Specific Question:   Supervising Provider    Answer:   Lazaro ArmsEURE, LUTHER H [[id number]]    Labs/procedures today: nst  Treatment Plan:  Growth u/s @ 33, 36wks    2x/wk testing @ 32wks or weekly BPP     Deliver 39 or PRN  Reviewed: Preterm labor symptoms and general obstetric  precautions including but not limited to vaginal bleeding, contractions, leaking of fluid and fetal movement were reviewed in detail with the patient.  All questions were answered.  Follow-up: Return for Thurs for hrob, bpp/EFW u/s, then next Mon/Thur for hrob/nst.  Orders Placed This Encounter  Procedures  . US OB Follow Up  . US FETAL BPP WO NON STRESS  . TSH  . POC Urinalysis Dipstick OB   Cheral MarkerKimberly R  CNM, [facility]NP-BC 10/06/2018 1:33 PM

## 2018-10-06 NOTE — Telephone Encounter (Signed)
[facility] and advise pt thank  You ( for this THurs)

## 2018-10-06 NOTE — Patient Instructions (Signed)
Elnita MaxwellMary S Noone, I greatly value your feedback.  If you receive a survey following your visit with us today, we appreciate you taking the time to fill it out.  Thanks, Joellyn HaffKim Axel Meas, CNM, WHNP-BC  Increase morning insulin to 40units cloudy/NPH and 30units regular/clear Evening insulin to 36units cloudy/NPH and 26units regular/clear Continue metformin 1,000mg  twice daily   Call the office [phone]) or [facility] if:  You begin to have strong, frequent contractions  Your water breaks.  Sometimes it is a big gush of fluid, sometimes it is just a trickle that keeps getting your panties wet or running down your legs  You have vaginal bleeding.  It is normal to have a small amount of spotting if your cervix was checked.   You don't feel your baby moving like normal.  If you don't, get you something to eat and drink and lay down and focus on feeling your baby move.  You should feel at least 10 movements in 2 hours.  If you don't, you should call the office or [facility] to [facility].

## 2018-10-07 LAB — TSH: TSH: 0.973 u[IU]/mL (ref 0.450–4.500)

## 2018-10-09 ENCOUNTER — Other Ambulatory Visit: Payer: Medicaid Other

## 2018-10-10 ENCOUNTER — Ambulatory Visit (INDEPENDENT_AMBULATORY_CARE_PROVIDER_SITE_OTHER): Payer: Medicaid Other | Admitting: *Deleted

## 2018-10-10 VITALS — BP 141/87 | HR 103 | Wt 239.0 lb

## 2018-10-10 DIAGNOSIS — Z1389 Encounter for screening for other disorder: Secondary | ICD-10-CM

## 2018-10-10 DIAGNOSIS — O0990 Supervision of high risk pregnancy, unspecified, unspecified trimester: Secondary | ICD-10-CM

## 2018-10-10 DIAGNOSIS — Z331 Pregnant state, incidental: Secondary | ICD-10-CM

## 2018-10-10 LAB — POCT URINALYSIS DIPSTICK OB
Glucose, UA: NEGATIVE
Leukocytes, UA: NEGATIVE
NITRITE UA: NEGATIVE
RBC UA: NEGATIVE

## 2018-10-10 NOTE — Progress Notes (Signed)
NST strip reviewed by Dr Despina Hidden.

## 2018-10-13 ENCOUNTER — Encounter: Payer: Self-pay | Admitting: Obstetrics & Gynecology

## 2018-10-13 ENCOUNTER — Ambulatory Visit (INDEPENDENT_AMBULATORY_CARE_PROVIDER_SITE_OTHER): Payer: Medicaid Other | Admitting: Obstetrics & Gynecology

## 2018-10-13 VITALS — BP 147/84 | HR 97 | Wt 235.0 lb

## 2018-10-13 DIAGNOSIS — O0990 Supervision of high risk pregnancy, unspecified, unspecified trimester: Secondary | ICD-10-CM

## 2018-10-13 DIAGNOSIS — O24319 Unspecified pre-existing diabetes mellitus in pregnancy, unspecified trimester: Secondary | ICD-10-CM

## 2018-10-13 DIAGNOSIS — Z3A33 33 weeks gestation of pregnancy: Secondary | ICD-10-CM | POA: Diagnosis not present

## 2018-10-13 NOTE — Progress Notes (Signed)
   HIGH-RISK PREGNANCY VISIT Patient name: Miranda [name] MRN [id number]  Date of birth: 1981-07-16 Chief Complaint:   Routine Prenatal Visit (NST)  History of Present Illness:   Miranda [name] is a 38 y.o. G85P1001 female at [redacted]w[redacted]d with an Estimated Date of Delivery: 11/25/18 being seen today for ongoing management of a high-risk pregnancy complicated by class  B DM.  Today she reports no complaints. Contractions: Not present. Vag. Bleeding: None.  Movement: Present. denies leaking of fluid.  Review of Systems:   Pertinent items are noted in HPI Denies abnormal vaginal discharge w/ itching/odor/irritation, headaches, visual changes, shortness of breath, chest pain, abdominal pain, severe nausea/vomiting, or problems with urination or bowel movements unless otherwise stated above. Pertinent History Reviewed:  Reviewed past medical,surgical, social, obstetrical and family history.  Reviewed problem list, medications and allergies. Physical Assessment:   Vitals:   10/13/18 1108  BP: (!) 147/84  Pulse: 97  Weight: 235 lb (106.6 kg)  Body mass index is 35.73 kg/m.           Physical Examination:   General appearance: alert, well appearing, and in no distress  Mental status: alert, oriented to person, place, and time  Skin: warm & dry   Extremities: Edema: Trace    Cardiovascular: normal heart rate noted  Respiratory: normal respiratory effort, no distress  Abdomen: gravid, soft, non-tender  Pelvic: Cervical exam deferred         Fetal Status: Fetal Heart Rate (bpm): 140 Fundal Height: 38 cm Movement: Present    Fetal Surveillance Testing today: Reactive NST   No results found for this or any previous visit (from the past 24 hour(s)).  Assessment & Plan:  1) High-risk pregnancy G2P1001 at 108w6d with an Estimated Date of Delivery: 11/25/18   2) Class B DM, stable, am CBG continue to be suboptimal 42N/32R  Am 40N/28R  PM + metformin 1000 MG BID  3) AMA, stable  Meds: No orders of  the defined types were placed in this encounter.   Labs/procedures today: Reactive NST  Treatment Plan:  Twice weekly NST, EFW 34+37 weeks   Reviewed: Term labor symptoms and general obstetric precautions including but not limited to vaginal bleeding, contractions, leaking of fluid and fetal movement were reviewed in detail with the patient.  All questions were answered.  Follow-up: Return in about 3 days (around 10/16/2018) for sonogram HROB is scheduled.  No orders of the defined types were placed in this encounter.  Lazaro Arms  10/13/2018 12:04 PM

## 2018-10-16 ENCOUNTER — Ambulatory Visit (INDEPENDENT_AMBULATORY_CARE_PROVIDER_SITE_OTHER): Payer: Medicaid Other | Admitting: Obstetrics & Gynecology

## 2018-10-16 ENCOUNTER — Ambulatory Visit (INDEPENDENT_AMBULATORY_CARE_PROVIDER_SITE_OTHER): Payer: Medicaid Other

## 2018-10-16 ENCOUNTER — Encounter: Payer: Self-pay | Admitting: Obstetrics & Gynecology

## 2018-10-16 ENCOUNTER — Other Ambulatory Visit: Payer: Medicaid Other | Admitting: Women's Health

## 2018-10-16 ENCOUNTER — Other Ambulatory Visit: Payer: Self-pay

## 2018-10-16 VITALS — BP 136/81 | HR 104 | Wt 236.0 lb

## 2018-10-16 DIAGNOSIS — O24313 Unspecified pre-existing diabetes mellitus in pregnancy, third trimester: Secondary | ICD-10-CM

## 2018-10-16 DIAGNOSIS — O24319 Unspecified pre-existing diabetes mellitus in pregnancy, unspecified trimester: Secondary | ICD-10-CM

## 2018-10-16 DIAGNOSIS — O0993 Supervision of high risk pregnancy, unspecified, third trimester: Secondary | ICD-10-CM

## 2018-10-16 DIAGNOSIS — Z3A34 34 weeks gestation of pregnancy: Secondary | ICD-10-CM

## 2018-10-16 DIAGNOSIS — Z1389 Encounter for screening for other disorder: Secondary | ICD-10-CM

## 2018-10-16 DIAGNOSIS — O0990 Supervision of high risk pregnancy, unspecified, unspecified trimester: Secondary | ICD-10-CM

## 2018-10-16 DIAGNOSIS — Z331 Pregnant state, incidental: Secondary | ICD-10-CM

## 2018-10-16 LAB — POCT URINALYSIS DIPSTICK OB
Blood, UA: NEGATIVE
GLUCOSE, UA: NEGATIVE
Nitrite, UA: NEGATIVE
POC,PROTEIN,UA: NEGATIVE

## 2018-10-16 NOTE — Progress Notes (Signed)
   HIGH-RISK PREGNANCY VISIT Patient name: Miranda [name] MRN [id number]  Date of birth: 1981/09/25 Chief Complaint:   High Risk Gestation (u/s today)  History of Present Illness:   Miranda [name] is a 38 y.o. G51P1001 female at [redacted]w[redacted]d with an Estimated Date of Delivery: 11/25/18 being seen today for ongoing management of a high-risk pregnancy complicated by class  B DM.  Today she reports no complaints. Contractions: Not present. Vag. Bleeding: None.  Movement: Present. denies leaking of fluid.  Review of Systems:   Pertinent items are noted in HPI Denies abnormal vaginal discharge w/ itching/odor/irritation, headaches, visual changes, shortness of breath, chest pain, abdominal pain, severe nausea/vomiting, or problems with urination or bowel movements unless otherwise stated above. Pertinent History Reviewed:  Reviewed past medical,surgical, social, obstetrical and family history.  Reviewed problem list, medications and allergies. Physical Assessment:   Vitals:   10/16/18 1548  BP: 136/81  Pulse: (!) 104  Weight: 236 lb (107 kg)  Body mass index is 35.88 kg/m.           Physical Examination:   General appearance: alert, well appearing, and in no distress  Mental status: alert, oriented to person, place, and time  Skin: warm & dry   Extremities: Edema: Trace    Cardiovascular: normal heart rate noted  Respiratory: normal respiratory effort, no distress  Abdomen: gravid, soft, non-tender  Pelvic: Cervical exam deferred         Fetal Status: Fetal Heart Rate (bpm): 146   Movement: Present    Fetal Surveillance Testing today: BPP 8/8   Results for orders placed or performed in visit on 10/16/18 (from the past 24 hour(s))  POC Urinalysis Dipstick OB   Collection Time: 10/16/18  3:50 PM  Result Value Ref Range   Color, UA     Clarity, UA     Glucose, UA Negative Negative   Bilirubin, UA     Ketones, UA small    Spec Grav, UA     Blood, UA neg    pH, UA     POC,PROTEIN,UA  Negative Negative, Trace, Small (1+), Moderate (2+), Large (3+), 4+   Urobilinogen, UA     Nitrite, UA neg    Leukocytes, UA Small (1+) (A) Negative   Appearance     Odor      Assessment & Plan:  1) High-risk pregnancy G2P1001 at [redacted]w[redacted]d with an Estimated Date of Delivery: 11/25/18   2) Class B DM, stable AM 42N/32R PM 42N(^2 units)/28R  + metformin 1000 mg BID 3) LGA, unstable, EFW 99%  Meds: No orders of the defined types were placed in this encounter.   Labs/procedures today: sonogram  Treatment Plan:  Twice weekly surveillance with IOL 39 weeks or weekly BPP  Reviewed: Preterm labor symptoms and general obstetric precautions including but not limited to vaginal bleeding, contractions, leaking of fluid and fetal movement were reviewed in detail with the patient.  All questions were answered.  Follow-up: Return in about 1 week (around 10/23/2018) for NST, HROB.  Orders Placed This Encounter  Procedures  . POC Urinalysis Dipstick OB   Lazaro Arms  10/16/2018 4:13 PM

## 2018-10-16 NOTE — Progress Notes (Signed)
[place] 34+2 wks,cephalic,BPP 8/8,FHR 146 BPM,normal ovaries bilat,afi 16 cm,posterior placenta gr 3,EFW 3227 g 99%

## 2018-10-17 ENCOUNTER — Telehealth: Payer: Self-pay | Admitting: *Deleted

## 2018-10-17 NOTE — Telephone Encounter (Signed)
Patient called back stating she went to [facility], checked her BP 152/97, still has a headache.  Discussed with Dr Despina Hidden and advised patient to start checking BP 4 times daily before checking blood sugar and record them. If any greater than 150/100, to go to [facility] or if headaches still continues through the afternoon despite taking Tylenol, to go to [facility].  Verbalized understanding.

## 2018-10-17 NOTE — Telephone Encounter (Signed)
Patient called with c/o a headache unresolved by Tylenol.  She is not having any other symptoms like blurred vision or spots, floaters.  Advised to come to office for BP check but patient stated she had to pick her child up from preschool at [employer] [address].  She states she will go to [facility] and check it then call us back.  Advised to call with result.

## 2018-10-17 NOTE — Telephone Encounter (Signed)
LMVOM returning patient's call.  

## 2018-10-19 ENCOUNTER — Encounter (HOSPITAL_COMMUNITY): Payer: Self-pay | Admitting: *Deleted

## 2018-10-19 ENCOUNTER — Inpatient Hospital Stay (HOSPITAL_COMMUNITY)
Admission: AD | Admit: 2018-10-19 | Discharge: 2018-10-19 | Disposition: A | Discharge status: Home / Self Care | Payer: Medicaid Other | Source: Ambulatory Visit | Attending: Obstetrics & Gynecology | Admitting: Obstetrics & Gynecology

## 2018-10-19 DIAGNOSIS — O24414 Gestational diabetes mellitus in pregnancy, insulin controlled: Secondary | ICD-10-CM | POA: Diagnosis not present

## 2018-10-19 DIAGNOSIS — J029 Acute pharyngitis, unspecified: Secondary | ICD-10-CM | POA: Diagnosis not present

## 2018-10-19 DIAGNOSIS — O163 Unspecified maternal hypertension, third trimester: Secondary | ICD-10-CM | POA: Insufficient documentation

## 2018-10-19 DIAGNOSIS — Z3A34 34 weeks gestation of pregnancy: Secondary | ICD-10-CM | POA: Diagnosis not present

## 2018-10-19 DIAGNOSIS — O26893 Other specified pregnancy related conditions, third trimester: Secondary | ICD-10-CM | POA: Insufficient documentation

## 2018-10-19 DIAGNOSIS — Z3689 Encounter for other specified antenatal screening: Secondary | ICD-10-CM

## 2018-10-19 DIAGNOSIS — O0990 Supervision of high risk pregnancy, unspecified, unspecified trimester: Secondary | ICD-10-CM

## 2018-10-19 LAB — URINALYSIS, ROUTINE W REFLEX MICROSCOPIC
BILIRUBIN URINE: NEGATIVE
Glucose, UA: >=500 mg/dL — AB
Hgb urine dipstick: NEGATIVE
KETONES UR: 20 mg/dL — AB
Nitrite: NEGATIVE
PROTEIN: 30 mg/dL — AB
Specific Gravity, Urine: 1.025 (ref 1.005–1.030)
pH: 5 (ref 5.0–8.0)

## 2018-10-19 LAB — CBC
HCT: 29.4 % — ABNORMAL LOW (ref 36.0–46.0)
Hemoglobin: 9.7 g/dL — ABNORMAL LOW (ref 12.0–15.0)
MCH: 28 pg (ref 26.0–34.0)
MCHC: 33 g/dL (ref 30.0–36.0)
MCV: 85 fL (ref 80.0–100.0)
Platelets: 304 10*3/uL (ref 150–400)
RBC: 3.46 MIL/uL — ABNORMAL LOW (ref 3.87–5.11)
RDW: 13.6 % (ref 11.5–15.5)
WBC: 9.7 10*3/uL (ref 4.0–10.5)
nRBC: 0 % (ref 0.0–0.2)

## 2018-10-19 LAB — COMPREHENSIVE METABOLIC PANEL
ALT: 10 U/L (ref 0–44)
AST: 16 U/L (ref 15–41)
Albumin: 2.8 g/dL — ABNORMAL LOW (ref 3.5–5.0)
Alkaline Phosphatase: 102 U/L (ref 38–126)
Anion gap: 6 (ref 5–15)
BUN: 9 mg/dL (ref 6–20)
CO2: 18 mmol/L — ABNORMAL LOW (ref 22–32)
Calcium: 8.2 mg/dL — ABNORMAL LOW (ref 8.9–10.3)
Chloride: 112 mmol/L — ABNORMAL HIGH (ref 98–111)
Creatinine, Ser: 0.53 mg/dL (ref 0.44–1.00)
GFR calc Af Amer: >60 mL/min (ref >60)
GFR calc non Af Amer: >60 mL/min (ref >60)
Glucose, Bld: 118 mg/dL — ABNORMAL HIGH (ref 70–99)
Potassium: 3.5 mmol/L (ref 3.5–5.1)
Sodium: 136 mmol/L (ref 135–145)
Total Bilirubin: 0.3 mg/dL (ref 0.3–1.2)
Total Protein: 5.7 g/dL — ABNORMAL LOW (ref 6.5–8.1)

## 2018-10-19 LAB — PROTEIN / CREATININE RATIO, URINE
Creatinine, Urine: 175 mg/dL
Protein Creatinine Ratio: 0.14 mg/mg{Cre} (ref 0.00–0.15)
Total Protein, Urine: 24 mg/dL

## 2018-10-19 LAB — GROUP A STREP BY PCR: Group A Strep by PCR: NOT DETECTED

## 2018-10-19 NOTE — MAU Note (Signed)
Pt here with c/o headache, increased BP, sore throat and ears hurting. C/o swelling in lower extremities and hands.

## 2018-10-19 NOTE — MAU Provider Note (Signed)
History     CSN: [id number]  Arrival date and time: 10/19/18 2336   First Provider Initiated Contact with Patient 10/19/18 2022      Chief Complaint  Patient presents with  . Headache  . Hypertension   Miranda [name] is a 38 y.o. G2P1 at 24w5dwho presents to MAU with complaints of HTN, HA and sore throat. She reports pregnancy is complicated by DM which she is on insulin for and HTN which she take 4x day at home. She reports having a HA on and off from Wednesday to Friday, was told to come to hospital on Friday for PEC evaluation but did not because blood pressure regulated. She denies having a HA today. Rated HA 5/10- took tylenol with relief. She reports coming specifically to MAU today for her sore throat. Reports symptoms of sore throat started on Saturday, denies cough or congestion. Reports sore throat is associated with ear pain, specifically left ear pain. Has not taken any medication for sore throat, denies being around anyone that is sick that she knows of. +FM. Denies RUQ pain, vision changes, N/V, or contractions.    OB History    Gravida  2   Para  1   Term  1   Preterm      AB      Living  1     SAB      TAB      Ectopic      Multiple      Live Births  1           Past Medical History:  Diagnosis Date  . Chest pain 07/24/2012   2D Echo EF 60%-65% which shoewd a bicuspid aortic vavle with mild stenosis  . Diabetes mellitus    type 2 on insulin  . Heart valve problem   . Herpes   . Hypothyroidism   . Irregular periods 04/14/2014  . Mental disorder    depression, postpartum depression  . Palpitation   . PVC (premature ventricular contraction) 07/24/2012   stress test which was normal with good excercise    Past Surgical History:  Procedure Laterality Date  . WISDOM TOOTH EXTRACTION      Family History  Problem Relation Age of Onset  . Hypertension Mother   . Diabetes Brother   . Diabetes Maternal Grandmother   . Congestive Heart  Failure Maternal Grandmother   . Hearing loss Daughter     Social History   Tobacco Use  . Smoking status: Current Every Day Smoker    Packs/day: 0.25    Years: 5.00    Pack years: 1.25    Types: Cigarettes    Last attempt to quit: 05/20/2012    Years since quitting: 6.4  . Smokeless tobacco: Never Used  . Tobacco comment: 5 cigs per day  Substance Use Topics  . Alcohol use: No    Alcohol/week: 0.0 standard drinks  . Drug use: No    Allergies:  Allergies  Allergen Reactions  . Keflex [Cephalexin] Nausea And Vomiting    Facility-Administered Medications Prior to Admission  Medication Dose Route Frequency Provider Last Rate Last Dose  . insulin starter kit- syringes (English) 1 kit  1 kit Other Once FJonnie Kind MD       Medications Prior to Admission  Medication Sig Dispense Refill Last Dose  . acyclovir (ZOVIRAX) 400 MG tablet Take 1 tablet (400 mg total) by mouth 3 (three) times daily. 90 tablet 11 Taking  .  aspirin 81 MG chewable tablet Chew by mouth daily.   Taking  . insulin aspart (NOVOLOG) 100 UNIT/ML injection Inject into the skin 3 (three) times daily before a meal. 24 units with breakfast, 4 units with lunch, 20 units with supper (Patient taking differently: Inject into the skin 3 (three) times daily before a meal. 28 units with breakfast, 20 units with supper) 10 mL 6 Taking  . insulin NPH Human (HUMULIN N,NOVOLIN N) 100 UNIT/ML injection Inject into the skin 2 (two) times daily before a meal. 34 units with breakfast, 22 units with supper (Patient taking differently: Inject into the skin 2 (two) times daily before a meal. 38 units with breakfast, 30 units with supper) 10 mL 6 Taking  . levothyroxine (SYNTHROID, LEVOTHROID) 150 MCG tablet Take 1 tablet (150 mcg total) by mouth daily before breakfast. 30 tablet 6 Taking  . metFORMIN (GLUCOPHAGE) 1000 MG tablet Take 1 tablet (1,000 mg total) by mouth 2 (two) times daily with a meal. 60 tablet 5 Taking  . sertraline  (ZOLOFT) 25 MG tablet Take 1 tablet (25 mg total) by mouth daily. 30 tablet 6 Taking    Review of Systems  Constitutional:       Hypertension  HENT: Positive for ear pain and sore throat. Negative for congestion.   Respiratory: Negative.   Cardiovascular: Negative.   Gastrointestinal: Negative.   Genitourinary: Negative.    Physical Exam   Blood pressure 121/76, pulse 98, temperature 98.1 F (36.7 C), temperature source Oral, resp. rate 20, height _0  (1.727 m), weight 108.9 kg, SpO2 100 %.  Physical Exam  Nursing note and vitals reviewed. Constitutional: She is oriented to person, place, and time. She appears well-developed and well-nourished. No distress.  HENT:  Right Ear: Hearing, tympanic membrane, external ear and ear canal normal. No drainage. Tympanic membrane is not bulging.  Left Ear: Hearing, tympanic membrane, external ear and ear canal normal. No drainage. Tympanic membrane is not bulging.  Cardiovascular: Normal rate, regular rhythm and normal heart sounds.  Respiratory: Effort normal and breath sounds normal. No respiratory distress. She has no wheezes.  GI: Soft. There is no abdominal tenderness.  Gravid, appropriate for gestational age  Musculoskeletal: Normal range of motion.        General: No edema.  Neurological: She is alert and oriented to person, place, and time. She displays normal reflexes. She exhibits normal muscle tone.  Psychiatric: She has a normal mood and affect. Her behavior is normal. Thought content normal.   FHR: 140/ moderate/+accels/ no decelerations  Toco: occasional UC with UI   MAU Course  Procedures  MDM Orders Placed This Encounter  Procedures  . Group A Strep by PCR  . Urinalysis, Routine w reflex microscopic  . CBC  . Comprehensive metabolic panel  . Protein / creatinine ratio, urine  . Diet gestational carb mod Fluid consistency: Thin; Room service appropriate? Yes   Labs reviewed:  Results for orders placed or  performed during the hospital encounter of 10/19/18 (from the past 24 hour(s))  Protein / creatinine ratio, urine     Status: None   Collection Time: 10/19/18  8:07 PM  Result Value Ref Range   Creatinine, Urine 175.00 mg/dL   Total Protein, Urine 24 mg/dL   Protein Creatinine Ratio 0.14 0.00 - 0.15 mg/mg[Cre]  Urinalysis, Routine w reflex microscopic     Status: Abnormal   Collection Time: 10/19/18  8:08 PM  Result Value Ref Range   Color, Urine YELLOW YELLOW  APPearance CLOUDY (A) CLEAR   Specific Gravity, Urine 1.025 1.005 - 1.030   pH 5.0 5.0 - 8.0   Glucose, UA >=500 (A) NEGATIVE mg/dL   Hgb urine dipstick NEGATIVE NEGATIVE   Bilirubin Urine NEGATIVE NEGATIVE   Ketones, ur 20 (A) NEGATIVE mg/dL   Protein, ur 30 (A) NEGATIVE mg/dL   Nitrite NEGATIVE NEGATIVE   Leukocytes, UA TRACE (A) NEGATIVE   RBC / HPF 0-5 0 - 5 RBC/hpf   WBC, UA 6-10 0 - 5 WBC/hpf   Bacteria, UA FEW (A) NONE SEEN   Squamous Epithelial / LPF 6-10 0 - 5   Mucus PRESENT    Ca Oxalate Crys, UA PRESENT   CBC     Status: Abnormal   Collection Time: 10/19/18  8:16 PM  Result Value Ref Range   WBC 9.7 4.0 - 10.5 K/uL   RBC 3.46 (L) 3.87 - 5.11 MIL/uL   Hemoglobin 9.7 (L) 12.0 - 15.0 g/dL   HCT 29.4 (L) 36.0 - 46.0 %   MCV 85.0 80.0 - 100.0 fL   MCH 28.0 26.0 - 34.0 pg   MCHC 33.0 30.0 - 36.0 g/dL   RDW 13.6 11.5 - 15.5 %   Platelets 304 150 - 400 K/uL   nRBC 0.0 0.0 - 0.2 %  Comprehensive metabolic panel     Status: Abnormal   Collection Time: 10/19/18  8:16 PM  Result Value Ref Range   Sodium 136 135 - 145 mmol/L   Potassium 3.5 3.5 - 5.1 mmol/L   Chloride 112 (H) 98 - 111 mmol/L   CO2 18 (L) 22 - 32 mmol/L   Glucose, Bld 118 (H) 70 - 99 mg/dL   BUN 9 6 - 20 mg/dL   Creatinine, Ser 0.53 0.44 - 1.00 mg/dL   Calcium 8.2 (L) 8.9 - 10.3 mg/dL   Total Protein 5.7 (L) 6.5 - 8.1 g/dL   Albumin 2.8 (L) 3.5 - 5.0 g/dL   AST 16 15 - 41 U/L   ALT 10 0 - 44 U/L   Alkaline Phosphatase 102 38 - 126 U/L    Total Bilirubin 0.3 0.3 - 1.2 mg/dL   GFR calc non Af Amer >60 >60 mL/min   GFR calc Af Amer >60 >60 mL/min   Anion gap 6 5 - 15   PEC labs negative. Urine glucose elevated- carb modified diet ordered so that patient can eat and take insulin that was brought with her. Strep PCR pending, patient reports usually having strep when she has sore throat and that leading to a ear infection. Assessment of ears normal. Will manage accordingly based on results.   Patient denies feeling contractions or any abdominal cramping. Declines cervical examination. Discussed reasons to return to MAU and to continue monitoring BP discussed in office. Follow up as scheduled for prenatal appointments. Pt stable at time of discharge.   Assessment and Plan   1. Maternal hypertension in third trimester   2. Supervision of high risk pregnancy, antepartum   3. Insulin controlled gestational diabetes mellitus (GDM) in third trimester   4. NST (non-stress test) reactive   5. [redacted] weeks gestation of pregnancy   6. Acute sore throat    Discharge home  NST reactive  PEC labs negative  Strep PCR pending- will call patient with results and manage accordingly  Follow up as scheduled for prenatal appointments Return to MAU as needed for reasons discussed  Hydration and rest  Safe medications during pregnancy discussed- list given  Follow-up Information    [facility]. Go on 10/23/2018.   Specialty:  Obstetrics and Gynecology Contact information: [address] [place] [address] [phone]          Allergies as of 10/19/2018      Reactions   Keflex [cephalexin] Nausea And Vomiting      Medication List    TAKE these medications   acyclovir 400 MG tablet Commonly known as:  ZOVIRAX Take 1 tablet (400 mg total) by mouth 3 (three) times daily.   aspirin 81 MG chewable tablet Chew by mouth daily.   insulin aspart 100 UNIT/ML injection Commonly known as:  novoLOG Inject  into the skin 3 (three) times daily before a meal. 24 units with breakfast, 4 units with lunch, 20 units with supper What changed:  additional instructions   insulin NPH Human 100 UNIT/ML injection Commonly known as:  HUMULIN N,NOVOLIN N Inject into the skin 2 (two) times daily before a meal. 34 units with breakfast, 22 units with supper What changed:  additional instructions   levothyroxine 150 MCG tablet Commonly known as:  SYNTHROID, LEVOTHROID Take 1 tablet (150 mcg total) by mouth daily before breakfast.   metFORMIN 1000 MG tablet Commonly known as:  GLUCOPHAGE Take 1 tablet (1,000 mg total) by mouth 2 (two) times daily with a meal.   sertraline 25 MG tablet Commonly known as:  ZOLOFT Take 1 tablet (25 mg total) by mouth daily.      Lajean Manes CNM 10/19/2018, 11:18 PM

## 2018-10-23 ENCOUNTER — Ambulatory Visit (INDEPENDENT_AMBULATORY_CARE_PROVIDER_SITE_OTHER): Payer: Medicaid Other | Admitting: Family Medicine

## 2018-10-23 ENCOUNTER — Encounter: Payer: Self-pay | Admitting: Family Medicine

## 2018-10-23 VITALS — BP 129/78 | HR 101 | Wt 236.2 lb

## 2018-10-23 DIAGNOSIS — O24913 Unspecified diabetes mellitus in pregnancy, third trimester: Secondary | ICD-10-CM

## 2018-10-23 DIAGNOSIS — Z331 Pregnant state, incidental: Secondary | ICD-10-CM

## 2018-10-23 DIAGNOSIS — Z3A35 35 weeks gestation of pregnancy: Secondary | ICD-10-CM

## 2018-10-23 DIAGNOSIS — E039 Hypothyroidism, unspecified: Secondary | ICD-10-CM

## 2018-10-23 DIAGNOSIS — O0993 Supervision of high risk pregnancy, unspecified, third trimester: Secondary | ICD-10-CM

## 2018-10-23 DIAGNOSIS — Z1389 Encounter for screening for other disorder: Secondary | ICD-10-CM

## 2018-10-23 DIAGNOSIS — O0990 Supervision of high risk pregnancy, unspecified, unspecified trimester: Secondary | ICD-10-CM

## 2018-10-23 DIAGNOSIS — O24919 Unspecified diabetes mellitus in pregnancy, unspecified trimester: Secondary | ICD-10-CM

## 2018-10-23 LAB — POCT URINALYSIS DIPSTICK OB
Blood, UA: NEGATIVE
Glucose, UA: NEGATIVE
Ketones, UA: NEGATIVE
Leukocytes, UA: NEGATIVE
NITRITE UA: NEGATIVE
PROTEIN: NEGATIVE

## 2018-10-23 NOTE — Progress Notes (Signed)
   HIGH-RISK PREGNANCY VISIT Patient name: Miranda [name] MRN [id number]  Date of birth: Feb 15, 1981 Chief Complaint:   Routine Prenatal Visit (NST)  History of Present Illness:   Miranda [name] is a 38 y.o. 202P1001 female at 6335w2 with an Estimated Date of Delivery: 11/25/18 being seen today for ongoing management of a high-risk pregnancy complicated by class  B DM.  Today she reports no complaints. Contractions: Not present. Vag. Bleeding: None.  Movement: Present. denies leaking of fluid.  Review of Systems:   Pertinent items are noted in HPI Denies abnormal vaginal discharge w/ itching/odor/irritation, headaches, visual changes, shortness of breath, chest pain, abdominal pain, severe nausea/vomiting, or problems with urination or bowel movements unless otherwise stated above. Pertinent History Reviewed:  Reviewed past medical,surgical, social, obstetrical and family history.  Reviewed problem list, medications and allergies. Physical Assessment:   Vitals:   10/23/18 0905  BP: 129/78  Pulse: (!) 101  Weight: 236 lb 3.2 oz (107.1 kg)  Body mass index is 35.91 kg/m.           Physical Examination:   General appearance: alert, well appearing, and in no distress  Mental status: alert, oriented to person, place, and time  Skin: warm & dry   Extremities: Edema: Trace    Cardiovascular: normal heart rate noted  Respiratory: normal respiratory effort, no distress  Abdomen: gravid, soft, non-tender  Pelvic: Cervical exam deferred         Fetal Status:     Movement: Present    Fetal Surveillance Testing today: NST reactive  No results found for this or any previous visit (from the past 24 hour(s)).  Assessment & Plan:  1) High-risk pregnancy G2P1001 at 6762w2d with an Estimated Date of Delivery: 11/25/18   2) Class B DM, stable- no change in medications today  AM 42N/32R PM 42N/28R  + metformin 1000 mg BID 3) LGA, unstable, EFW 99%  Meds: No orders of the defined types were placed in  this encounter.   Labs/procedures today:NST  Treatment Plan:  Twice weekly surveillance with IOL 39 weeks or weekly BPP  Reviewed: Preterm labor symptoms and general obstetric precautions including but not limited to vaginal bleeding, contractions, leaking of fluid and fetal movement were reviewed in detail with the patient.  All questions were answered.  Follow-up: Return for Scheduled prenatal/NST.  Orders Placed This Encounter  Procedures  . POC Urinalysis Dipstick OB   Isa RankinKimberly Niles [facility]  10/26/2018 9:59 PM

## 2018-10-24 ENCOUNTER — Other Ambulatory Visit: Payer: Self-pay | Admitting: Family Medicine

## 2018-10-24 DIAGNOSIS — O09523 Supervision of elderly multigravida, third trimester: Secondary | ICD-10-CM

## 2018-10-24 DIAGNOSIS — O24410 Gestational diabetes mellitus in pregnancy, diet controlled: Secondary | ICD-10-CM

## 2018-10-27 ENCOUNTER — Ambulatory Visit (INDEPENDENT_AMBULATORY_CARE_PROVIDER_SITE_OTHER): Payer: Medicaid Other

## 2018-10-27 DIAGNOSIS — O0990 Supervision of high risk pregnancy, unspecified, unspecified trimester: Secondary | ICD-10-CM

## 2018-10-27 DIAGNOSIS — O24410 Gestational diabetes mellitus in pregnancy, diet controlled: Secondary | ICD-10-CM

## 2018-10-27 DIAGNOSIS — O09523 Supervision of elderly multigravida, third trimester: Secondary | ICD-10-CM | POA: Diagnosis not present

## 2018-10-27 DIAGNOSIS — O24919 Unspecified diabetes mellitus in pregnancy, unspecified trimester: Secondary | ICD-10-CM

## 2018-10-27 NOTE — Progress Notes (Signed)
[place] 35+6 wks,cephalic,fhr 152 bpm,posterior placenta gr 3,afi 17 cm,bilat adnexa's wnl,efw 3988 g 99.9%,BPP 8/8

## 2018-10-30 ENCOUNTER — Other Ambulatory Visit: Payer: Self-pay | Admitting: Obstetrics & Gynecology

## 2018-10-30 ENCOUNTER — Encounter: Payer: Self-pay | Admitting: Advanced Practice Midwife

## 2018-10-30 ENCOUNTER — Ambulatory Visit (INDEPENDENT_AMBULATORY_CARE_PROVIDER_SITE_OTHER): Payer: Medicaid Other | Admitting: Advanced Practice Midwife

## 2018-10-30 ENCOUNTER — Other Ambulatory Visit: Payer: Self-pay | Admitting: Advanced Practice Midwife

## 2018-10-30 VITALS — BP 137/73 | HR 106 | Wt 237.0 lb

## 2018-10-30 DIAGNOSIS — O24913 Unspecified diabetes mellitus in pregnancy, third trimester: Secondary | ICD-10-CM

## 2018-10-30 DIAGNOSIS — Z3A36 36 weeks gestation of pregnancy: Secondary | ICD-10-CM | POA: Diagnosis not present

## 2018-10-30 DIAGNOSIS — Z331 Pregnant state, incidental: Secondary | ICD-10-CM

## 2018-10-30 DIAGNOSIS — O0993 Supervision of high risk pregnancy, unspecified, third trimester: Secondary | ICD-10-CM

## 2018-10-30 DIAGNOSIS — Z1389 Encounter for screening for other disorder: Secondary | ICD-10-CM

## 2018-10-30 DIAGNOSIS — O24919 Unspecified diabetes mellitus in pregnancy, unspecified trimester: Secondary | ICD-10-CM

## 2018-10-30 LAB — POCT URINALYSIS DIPSTICK OB
Blood, UA: NEGATIVE
Ketones, UA: NEGATIVE
Nitrite, UA: NEGATIVE
POC,PROTEIN,UA: NEGATIVE

## 2018-10-30 NOTE — Patient Instructions (Signed)

## 2018-10-30 NOTE — Progress Notes (Signed)
HIGH-RISK PREGNANCY VISIT Patient name: Miranda [name] MRN [id number]  Date of birth: 11-09-1980 Chief Complaint:   High Risk Gestation (NST; gbs/gc)  History of Present Illness:   Miranda [name] is a 38 y.o. G4P1001 female at 68w2dwith an Estimated Date of Delivery: 11/25/18 being seen today for ongoing management of a high-risk pregnancy complicated by class  B DM. And LGA >99%Today she reports FBS all normal and 2hr PP only 2 "a little high". (forgot log).  Contractions: Not present. Vag. Bleeding: None.  Movement: Present. denies leaking of fluid.  Review of Systems:   Pertinent items are noted in HPI Denies abnormal vaginal discharge w/ itching/odor/irritation, headaches, visual changes, shortness of breath, chest pain, abdominal pain, severe nausea/vomiting, or problems with urination or bowel movements unless otherwise stated above.    Pertinent History Reviewed:  Medical & Surgical Hx:   Past Medical History:  Diagnosis Date  . Chest pain 07/24/2012   2D Echo EF 60%-65% which shoewd a bicuspid aortic vavle with mild stenosis  . Diabetes mellitus    type 2 on insulin  . Heart valve problem   . Herpes   . Hypothyroidism   . Irregular periods 04/14/2014  . Mental disorder    depression, postpartum depression  . Palpitation   . PVC (premature ventricular contraction) 07/24/2012   stress test which was normal with good excercise   Past Surgical History:  Procedure Laterality Date  . WISDOM TOOTH EXTRACTION     Family History  Problem Relation Age of Onset  . Hypertension Mother   . Diabetes Brother   . Diabetes Maternal Grandmother   . Congestive Heart Failure Maternal Grandmother   . Hearing loss Daughter     Current Outpatient Medications:  .  acyclovir (ZOVIRAX) 400 MG tablet, Take 1 tablet (400 mg total) by mouth 3 (three) times daily., Disp: 90 tablet, Rfl: 11 .  aspirin 81 MG chewable tablet, Chew by mouth daily., Disp: , Rfl:  .  insulin aspart (NOVOLOG) 100 UNIT/ML  injection, Inject into the skin 3 (three) times daily before a meal. 24 units with breakfast, 4 units with lunch, 20 units with supper (Patient taking differently: Inject into the skin 3 (three) times daily before a meal. 28 units with breakfast, 20 units with supper), Disp: 10 mL, Rfl: 6 .  insulin NPH Human (HUMULIN N,NOVOLIN N) 100 UNIT/ML injection, Inject into the skin 2 (two) times daily before a meal. 34 units with breakfast, 22 units with supper (Patient taking differently: Inject into the skin 2 (two) times daily before a meal. 38 units with breakfast, 30 units with supper), Disp: 10 mL, Rfl: 6 .  levothyroxine (SYNTHROID, LEVOTHROID) 150 MCG tablet, Take 1 tablet (150 mcg total) by mouth daily before breakfast., Disp: 30 tablet, Rfl: 6 .  metFORMIN (GLUCOPHAGE) 1000 MG tablet, Take 1 tablet (1,000 mg total) by mouth 2 (two) times daily with a meal., Disp: 60 tablet, Rfl: 5 .  sertraline (ZOLOFT) 25 MG tablet, Take 1 tablet (25 mg total) by mouth daily., Disp: 30 tablet, Rfl: 6  Current Facility-Administered Medications:  .  insulin starter kit- syringes (English) 1 kit, 1 kit, Other, Once, FJonnie Kind MD Social History: Reviewed -  reports that she has been smoking cigarettes. She has a 1.25 pack-year smoking history. She has never used smokeless tobacco.   Physical Assessment:   Vitals:   10/30/18 1520  BP: 137/73  Pulse: (!) 106  Weight: 107.5 kg  Body mass  index is 36.04 kg/m.           Physical Examination:   General appearance: alert, well appearing, and in no distress  Mental status: alert, oriented to person, place, and time  Skin: warm & dry   Extremities: Edema: Trace    Cardiovascular: normal heart rate noted  Respiratory: normal respiratory effort, no distress  Abdomen: gravid, soft, non-tender  Pelvic: Cervical exam deferred  Dilation: Closed Effacement (%): Thick Station: Ballotable  Presenting part not in pelvis  Fetal Status: Fetal Heart Rate (bpm): 140    Movement: Present Presentation: Vertex  Fetal Surveillance Testing today: NST: FHR baseline 140 bpm, Variability: moderate, Accelerations:present, Decelerations:  Absent= Cat 1/Reactive   Results for orders placed or performed in visit on 10/30/18 (from the past 24 hour(s))  POC Urinalysis Dipstick OB   Collection Time: 10/30/18  3:17 PM  Result Value Ref Range   Color, UA     Clarity, UA     Glucose, UA Trace (A) Negative   Bilirubin, UA     Ketones, UA neg    Spec Grav, UA     Blood, UA neg    pH, UA     POC,PROTEIN,UA Negative Negative, Trace, Small (1+), Moderate (2+), Large (3+), 4+   Urobilinogen, UA     Nitrite, UA neg    Leukocytes, UA Moderate (2+) (A) Negative   Appearance     Odor      Assessment & Plan:  1) High-risk pregnancy G2P1001 at 75w2dwith an Estimated Date of Delivery: 11/25/18   2) Class B DM, stable- no change in medications today  AM 42N/32R PM 42N/28R  + metformin 1000 mg BID 3) LGA, unstable, EFW 99%. Presenting part not in pelvis.  Check EFW 38 weeks  Labs/procedures today: NST   Follow-up: Return in about 1 week (around 11/06/2018) for weekly BPP.  Future Appointments  Date Time Provider [place]orunna 11/07/2018 12:45 PM [facility] [place] None  11/07/2018  1:15 PM FJonnie Kind MD [facility]  11/13/2018  8:30 AM [facility] [place] None  11/13/2018  9:15 AM Cresenzo-Dishmon, FJoaquim Lai CNM [facility]    Orders Placed This Encounter  Procedures  . GC/Chlamydia Probe Amp  . Culture, beta strep (group b only)  . [place] BPP WO NON STRESS  . POC Urinalysis Dipstick OB   FChristin FudgeCNM 10/30/2018 8:08 PM

## 2018-11-02 LAB — CULTURE, BETA STREP (GROUP B ONLY): Strep Gp B Culture: POSITIVE — AB

## 2018-11-04 ENCOUNTER — Other Ambulatory Visit: Payer: Self-pay | Admitting: Advanced Practice Midwife

## 2018-11-04 LAB — GC/CHLAMYDIA PROBE AMP
Chlamydia trachomatis, NAA: NEGATIVE
Neisseria gonorrhoeae by PCR: NEGATIVE

## 2018-11-04 MED ORDER — LEVOTHYROXINE SODIUM 150 MCG PO TABS: 150.0000 ug | ORAL_TABLET | Freq: Every day | ORAL | 6 refills | Status: DC

## 2018-11-06 ENCOUNTER — Telehealth: Payer: Self-pay | Admitting: *Deleted

## 2018-11-06 NOTE — Telephone Encounter (Signed)
Pt called asking if there is a medication she needs to take since she tested positive for GBS. Advised that she does not need to be treated currently. Advised that she would be treated with IV antibiotics during labor as a preventative measure for the baby. Pt verbalized understanding.

## 2018-11-07 ENCOUNTER — Ambulatory Visit (INDEPENDENT_AMBULATORY_CARE_PROVIDER_SITE_OTHER): Payer: Medicaid Other

## 2018-11-07 ENCOUNTER — Ambulatory Visit: Payer: Medicaid Other

## 2018-11-07 ENCOUNTER — Ambulatory Visit (INDEPENDENT_AMBULATORY_CARE_PROVIDER_SITE_OTHER): Payer: Medicaid Other | Admitting: Obstetrics and Gynecology

## 2018-11-07 ENCOUNTER — Encounter: Payer: Self-pay | Admitting: Obstetrics and Gynecology

## 2018-11-07 VITALS — BP 117/75 | HR 90 | Wt 237.8 lb

## 2018-11-07 DIAGNOSIS — O0993 Supervision of high risk pregnancy, unspecified, third trimester: Secondary | ICD-10-CM

## 2018-11-07 DIAGNOSIS — Z1389 Encounter for screening for other disorder: Secondary | ICD-10-CM

## 2018-11-07 DIAGNOSIS — O24913 Unspecified diabetes mellitus in pregnancy, third trimester: Secondary | ICD-10-CM

## 2018-11-07 DIAGNOSIS — Z331 Pregnant state, incidental: Secondary | ICD-10-CM

## 2018-11-07 DIAGNOSIS — Z3A37 37 weeks gestation of pregnancy: Secondary | ICD-10-CM

## 2018-11-07 DIAGNOSIS — O0990 Supervision of high risk pregnancy, unspecified, unspecified trimester: Secondary | ICD-10-CM

## 2018-11-07 DIAGNOSIS — O24919 Unspecified diabetes mellitus in pregnancy, unspecified trimester: Secondary | ICD-10-CM

## 2018-11-07 LAB — POCT URINALYSIS DIPSTICK OB
Glucose, UA: NEGATIVE
Nitrite, UA: NEGATIVE
POC,PROTEIN,UA: NEGATIVE
RBC UA: NEGATIVE

## 2018-11-07 NOTE — Progress Notes (Signed)
Patient ID: DENNYS FRISINGER, female   DOB: April 29, 1981, 38 y.o.   MRN: [id number]    HIGH-RISK PREGNANCY VISIT Patient name: Miranda [name] MRN [id number]  Date of birth: Apr 28, 1981 Chief Complaint:   High Risk Gestation ([place] today; cough, sorethroat)  History of Present Illness:   Miranda [name] is a 38 y.o. G70P1001 female at [redacted]w[redacted]d with an Estimated Date of Delivery: 11/25/18 being seen today for ongoing management of a high-risk pregnancy complicated by class  B DM.  FBS 87-110 only 1 above 100. In last 10 days 3 blood sugars above 120. Was not taking any insulin before pregnancy was only taking metformin. Has a sore throat and cough from cold. Has signed tubal papers for after delivery while in hospital. Last pregnancy had long induction of 2 days with difficult vaginal delivery with vaginal vacuum. Today she reports no complaints. Contractions: Not present. Vag. Bleeding: None.  Movement: Present. denies leaking of fluid.  Review of Systems:   Pertinent items are noted in HPI Denies abnormal vaginal discharge w/ itching/odor/irritation, headaches, visual changes, shortness of breath, chest pain, abdominal pain, severe nausea/vomiting, or problems with urination or bowel movements unless otherwise stated above. Pertinent History Reviewed:  Reviewed past medical,surgical, social, obstetrical and family history.  Reviewed problem list, medications and allergies. Physical Assessment:   Vitals:   11/07/18 1334  BP: 117/75  Pulse: 90  Weight: 237 lb 12.8 oz (107.9 kg)  Body mass index is 36.16 kg/m.           Physical Examination:   General appearance: alert, well appearing, and in no distress and oriented to person, place, and time  Mental status: alert, oriented to person, place, and time, normal mood, behavior, speech, dress, motor activity, and thought processes, affect appropriate to mood  Skin: warm & dry   Extremities: Edema: Trace    Cardiovascular: normal heart rate noted  Respiratory:  normal respiratory effort, no distress  Abdomen: gravid, soft, non-tender  Pelvic: Cervical exam deferred         Fetal Status:   Fundal Height: 39 cm Movement: Present    Fetal Surveillance Testing today: [place] 37+3 wks,cephalic,BPP 8/8,posterior/fundal placenta gr 3,fhr 126 bpm,afi 12 cm   Results for orders placed or performed in visit on 11/07/18 (from the past 24 hour(s))  POC Urinalysis Dipstick OB   Collection Time: 11/07/18  1:34 PM  Result Value Ref Range   Color, UA     Clarity, UA     Glucose, UA Negative Negative   Bilirubin, UA     Ketones, UA trace    Spec Grav, UA     Blood, UA neg    pH, UA     POC,PROTEIN,UA Negative Negative, Trace, Small (1+), Moderate (2+), Large (3+), 4+   Urobilinogen, UA     Nitrite, UA neg    Leukocytes, UA Trace (A) Negative   Appearance     Odor      Assessment & Plan:  1) High-risk pregnancy G2P1001 at [redacted]w[redacted]d with an Estimated Date of Delivery: 11/25/18   2) Class B DM, stable, continue insuline injection  3) Cold, Robitussin DM  Meds: No orders of the defined types were placed in this encounter.   Labs/procedures today: [place] 37+3 wks,cephalic,BPP 8/8,posterior/fundal placenta gr 3,fhr 126 bpm,afi 12 cm  Treatment Plan:  1. F/u in 1 week for BPP, u/s for fetal weight  Reviewed: Term labor symptoms and general obstetric precautions including but not limited to vaginal bleeding,  contractions, leaking of fluid and fetal movement were reviewed in detail with the patient.  All questions were answered.  Follow-up: Return in about 1 week (around 11/14/2018) for BPP.  And ultrasound with estimated fetal weight.  Will make decision after that ultrasound as to whether to proceed with primary cesarean and tubal ligation versus induction of labor at 39 weeks  Orders Placed This Encounter  Procedures  . POC Urinalysis Dipstick OB   By signing my name below, I, Arnette Norris, attest that this documentation has been prepared under the direction and  in the presence of Tilda Burrow, MD. Electronically Signed: Arnette Norris Medical Scribe. 11/07/18. 1:54 PM.  I personally performed the services described in this documentation, which was SCRIBED in my presence. The recorded information has been reviewed and considered accurate. It has been edited as necessary during review. Tilda Burrow, MD

## 2018-11-07 NOTE — Progress Notes (Signed)
[place] 37+3 wks,cephalic,BPP 8/8,posterior/fundal placenta gr 3,fhr 126 bpm,afi 12 cm

## 2018-11-10 ENCOUNTER — Other Ambulatory Visit: Payer: Self-pay | Admitting: Obstetrics & Gynecology

## 2018-11-12 ENCOUNTER — Other Ambulatory Visit: Payer: Self-pay | Admitting: Advanced Practice Midwife

## 2018-11-12 DIAGNOSIS — O0993 Supervision of high risk pregnancy, unspecified, third trimester: Secondary | ICD-10-CM

## 2018-11-12 DIAGNOSIS — O24919 Unspecified diabetes mellitus in pregnancy, unspecified trimester: Secondary | ICD-10-CM

## 2018-11-13 ENCOUNTER — Other Ambulatory Visit: Payer: Medicaid Other

## 2018-11-13 ENCOUNTER — Encounter: Payer: Medicaid Other | Admitting: Advanced Practice Midwife

## 2018-11-13 ENCOUNTER — Ambulatory Visit (INDEPENDENT_AMBULATORY_CARE_PROVIDER_SITE_OTHER): Payer: Medicaid Other

## 2018-11-13 ENCOUNTER — Ambulatory Visit (INDEPENDENT_AMBULATORY_CARE_PROVIDER_SITE_OTHER): Payer: Medicaid Other | Admitting: Obstetrics & Gynecology

## 2018-11-13 ENCOUNTER — Encounter: Payer: Self-pay | Admitting: Obstetrics & Gynecology

## 2018-11-13 ENCOUNTER — Other Ambulatory Visit: Payer: Self-pay

## 2018-11-13 VITALS — BP 132/76 | HR 101 | Wt 236.0 lb

## 2018-11-13 DIAGNOSIS — Z331 Pregnant state, incidental: Secondary | ICD-10-CM

## 2018-11-13 DIAGNOSIS — O0993 Supervision of high risk pregnancy, unspecified, third trimester: Secondary | ICD-10-CM

## 2018-11-13 DIAGNOSIS — O24919 Unspecified diabetes mellitus in pregnancy, unspecified trimester: Secondary | ICD-10-CM

## 2018-11-13 DIAGNOSIS — Z3A38 38 weeks gestation of pregnancy: Secondary | ICD-10-CM

## 2018-11-13 DIAGNOSIS — O0990 Supervision of high risk pregnancy, unspecified, unspecified trimester: Secondary | ICD-10-CM

## 2018-11-13 DIAGNOSIS — O3663X1 Maternal care for excessive fetal growth, third trimester, fetus 1: Secondary | ICD-10-CM

## 2018-11-13 DIAGNOSIS — O24913 Unspecified diabetes mellitus in pregnancy, third trimester: Secondary | ICD-10-CM

## 2018-11-13 DIAGNOSIS — Z1389 Encounter for screening for other disorder: Secondary | ICD-10-CM

## 2018-11-13 LAB — POCT URINALYSIS DIPSTICK OB
Blood, UA: NEGATIVE
Glucose, UA: NEGATIVE
KETONES UA: NEGATIVE
Leukocytes, UA: NEGATIVE
Nitrite, UA: NEGATIVE
POC,PROTEIN,UA: NEGATIVE

## 2018-11-13 NOTE — Progress Notes (Signed)
   HIGH-RISK PREGNANCY VISIT Patient name: Miranda [name] MRN [id number]  Date of birth: 1980/11/01 Chief Complaint:   High Risk Gestation (u/s today)  History of Present Illness:   Miranda [name] is a 38 y.o. G15P1001 female at [redacted]w[redacted]d with an Estimated Date of Delivery: 11/25/18 being seen today for ongoing management of a high-risk pregnancy complicated by class  B DM.  Today she reports no complaints. Contractions: Not present. Vag. Bleeding: None.  Movement: Present. denies leaking of fluid.  Review of Systems:   Pertinent items are noted in HPI Denies abnormal vaginal discharge w/ itching/odor/irritation, headaches, visual changes, shortness of breath, chest pain, abdominal pain, severe nausea/vomiting, or problems with urination or bowel movements unless otherwise stated above. Pertinent History Reviewed:  Reviewed past medical,surgical, social, obstetrical and family history.  Reviewed problem list, medications and allergies. Physical Assessment:   Vitals:   11/13/18 1145  BP: 132/76  Pulse: (!) 101  Weight: 236 lb (107 kg)  Body mass index is 35.88 kg/m.           Physical Examination:   General appearance: alert, well appearing, and in no distress  Mental status: alert, oriented to person, place, and time  Skin: warm & dry   Extremities: Edema: Trace    Cardiovascular: normal heart rate noted  Respiratory: normal respiratory effort, no distress  Abdomen: gravid, soft, non-tender  Pelvic: Cervical exam deferred         Fetal Status:     Movement: Present    Fetal Surveillance Testing today: BPP 8/8   Results for orders placed or performed in visit on 11/13/18 (from the past 24 hour(s))  POC Urinalysis Dipstick OB   Collection Time: 11/13/18 11:31 AM  Result Value Ref Range   Color, UA     Clarity, UA     Glucose, UA Negative Negative   Bilirubin, UA     Ketones, UA neg    Spec Grav, UA     Blood, UA neg    pH, UA     POC,PROTEIN,UA Negative Negative, Trace, Small  (1+), Moderate (2+), Large (3+), 4+   Urobilinogen, UA     Nitrite, UA neg    Leukocytes, UA Negative Negative   Appearance     Odor      Assessment & Plan:  1) High-risk pregnancy G2P1001 at [redacted]w[redacted]d with an Estimated Date of Delivery: 11/25/18   2) Class B DM, stable, metformin 1000 BID + split dose NPH/Reg  3) Fetal macrosomia, stable, EFW 99% with history of vacuum extraction with 71b 12 oz baby with her first delivery, this baby is still out of the pelvis, pt opts for primary Caesarean section with BTL  Meds: No orders of the defined types were placed in this encounter.   Labs/procedures today: sonogram  Treatment Plan:  Primary C section with VTL 11/19/2018  Reviewed: Term labor symptoms and general obstetric precautions including but not limited to vaginal bleeding, contractions, leaking of fluid and fetal movement were reviewed in detail with the patient.  All questions were answered.  Follow-up: Return in about 2 weeks (around 11/27/2018) for Post Op, with Dr Despina Hidden.  Orders Placed This Encounter  Procedures  . POC Urinalysis Dipstick OB   Lazaro Arms  11/13/2018 12:19 PM

## 2018-11-13 NOTE — Progress Notes (Signed)
[place] 38+2 wks,cephalic,fhr 157 bpm,posterior placenta gr 3,afi 17 cm,efw 4333 g 99%

## 2018-11-17 ENCOUNTER — Telehealth: Payer: Self-pay | Admitting: Obstetrics & Gynecology

## 2018-11-17 NOTE — Telephone Encounter (Signed)
Patient informed surgery scheduled for 11/19/2018 1000 am, they will call tomorrow with her pre op. Pt verbalized understanding.

## 2018-11-17 NOTE — Telephone Encounter (Signed)
Patient called, stated that she is supposed to be scheduled for a c-section this Wednesday and no one has called her.  [phone]

## 2018-11-17 NOTE — Telephone Encounter (Signed)
Gosh she disappeared from the schedule, I put her back on don't know what happened  11/19/2018 1000 am, they will call tomorrow with her pre op

## 2018-11-18 ENCOUNTER — Encounter (HOSPITAL_COMMUNITY)
Admission: RE | Admit: 2018-11-18 | Discharge: 2018-11-18 | Disposition: A | Discharge status: Home / Self Care | Payer: Medicaid Other | Source: Ambulatory Visit

## 2018-11-18 NOTE — Pre-Procedure Instructions (Signed)
Emailed instructions to patient at her request to be a lab same day.  Reviewed instructions with pt with verbalized understanding.  She confirmed email receipt.

## 2018-11-18 NOTE — Patient Instructions (Signed)
[name]  11/18/2018   Your procedure is scheduled on:  11/19/2018  Enter through the Maternity Admissions of [facility] at 0815 AM.  Check in at Desk and tell them you are here to be checked in for your CS  Call this number if you have problems the morning of surgery:[phone]  Remember:   Do not eat food:(After Midnight) Desps de medianoche.  Do not drink clear liquids: (After Midnight) Desps de medianoche.  Take these medicines the morning of surgery with A SIP OF WATER: DO NOT TAKE METFORMIN THE NIGHT BEFORE OR THE MORNING OF SURGERY.                 DO NOT TAKE ANY INSULIN THE MORNING OF SURGERY                  TAKE ZOLOFT AND LEVOTHYROXINE AND VALTREX AS PRESCRIBED                 TAKE 21 UNITS OF NPH TONIGHT .   Do not wear jewelry, make-up or nail polish.  Do not wear lotions, powders, or perfumes. Do not wear deodorant.  Do not shave 48 hours prior to surgery.  Do not bring valuables to the hospital.  [facility] is not   responsible for any belongings or valuables brought to the hospital.  Contacts, dentures or bridgework may not be worn into surgery.  Leave suitcase in the car. After surgery it may be brought to your room.  For patients admitted to the hospital, checkout time is 11:00 AM the day of              discharge.    N/A   Please read over the following fact sheets that you were given:   Surgical Site Infection Prevention

## 2018-11-19 ENCOUNTER — Encounter (HOSPITAL_COMMUNITY)
Admission: AD | Disposition: A | Discharge status: Home / Self Care | Payer: Self-pay | Source: Home / Self Care | Attending: Obstetrics & Gynecology

## 2018-11-19 ENCOUNTER — Inpatient Hospital Stay (HOSPITAL_COMMUNITY): Payer: Medicaid Other | Admitting: Anesthesiology

## 2018-11-19 ENCOUNTER — Inpatient Hospital Stay (HOSPITAL_COMMUNITY)
Admission: AD | Admit: 2018-11-19 | Discharge: 2018-11-21 | DRG: 783 | Disposition: A | Discharge status: Home / Self Care | Payer: Medicaid Other | Attending: Obstetrics & Gynecology | Admitting: Obstetrics & Gynecology

## 2018-11-19 ENCOUNTER — Encounter (HOSPITAL_COMMUNITY): Payer: Self-pay | Admitting: *Deleted

## 2018-11-19 DIAGNOSIS — Z3A39 39 weeks gestation of pregnancy: Secondary | ICD-10-CM | POA: Diagnosis not present

## 2018-11-19 DIAGNOSIS — O9081 Anemia of the puerperium: Secondary | ICD-10-CM | POA: Diagnosis present

## 2018-11-19 DIAGNOSIS — Z794 Long term (current) use of insulin: Secondary | ICD-10-CM | POA: Diagnosis not present

## 2018-11-19 DIAGNOSIS — E039 Hypothyroidism, unspecified: Secondary | ICD-10-CM | POA: Diagnosis present

## 2018-11-19 DIAGNOSIS — O9972 Diseases of the skin and subcutaneous tissue complicating childbirth: Secondary | ICD-10-CM | POA: Diagnosis present

## 2018-11-19 DIAGNOSIS — E119 Type 2 diabetes mellitus without complications: Secondary | ICD-10-CM | POA: Diagnosis present

## 2018-11-19 DIAGNOSIS — O34211 Maternal care for low transverse scar from previous cesarean delivery: Secondary | ICD-10-CM | POA: Diagnosis not present

## 2018-11-19 DIAGNOSIS — F17210 Nicotine dependence, cigarettes, uncomplicated: Secondary | ICD-10-CM | POA: Diagnosis present

## 2018-11-19 DIAGNOSIS — O2341 Unspecified infection of urinary tract in pregnancy, first trimester: Secondary | ICD-10-CM | POA: Diagnosis present

## 2018-11-19 DIAGNOSIS — F419 Anxiety disorder, unspecified: Secondary | ICD-10-CM | POA: Diagnosis present

## 2018-11-19 DIAGNOSIS — O3663X1 Maternal care for excessive fetal growth, third trimester, fetus 1: Secondary | ICD-10-CM | POA: Diagnosis not present

## 2018-11-19 DIAGNOSIS — Z302 Encounter for sterilization: Secondary | ICD-10-CM | POA: Diagnosis not present

## 2018-11-19 DIAGNOSIS — O99344 Other mental disorders complicating childbirth: Secondary | ICD-10-CM | POA: Diagnosis present

## 2018-11-19 DIAGNOSIS — O99214 Obesity complicating childbirth: Secondary | ICD-10-CM | POA: Diagnosis present

## 2018-11-19 DIAGNOSIS — O2412 Pre-existing diabetes mellitus, type 2, in childbirth: Secondary | ICD-10-CM | POA: Diagnosis not present

## 2018-11-19 DIAGNOSIS — O99284 Endocrine, nutritional and metabolic diseases complicating childbirth: Secondary | ICD-10-CM | POA: Diagnosis present

## 2018-11-19 DIAGNOSIS — F329 Major depressive disorder, single episode, unspecified: Secondary | ICD-10-CM | POA: Diagnosis present

## 2018-11-19 DIAGNOSIS — O3663X0 Maternal care for excessive fetal growth, third trimester, not applicable or unspecified: Principal | ICD-10-CM | POA: Diagnosis present

## 2018-11-19 DIAGNOSIS — F32A Depression, unspecified: Secondary | ICD-10-CM | POA: Diagnosis present

## 2018-11-19 DIAGNOSIS — L02211 Cutaneous abscess of abdominal wall: Secondary | ICD-10-CM | POA: Diagnosis present

## 2018-11-19 DIAGNOSIS — Z23 Encounter for immunization: Secondary | ICD-10-CM | POA: Diagnosis not present

## 2018-11-19 DIAGNOSIS — F17200 Nicotine dependence, unspecified, uncomplicated: Secondary | ICD-10-CM | POA: Diagnosis present

## 2018-11-19 DIAGNOSIS — L720 Epidermal cyst: Secondary | ICD-10-CM | POA: Diagnosis present

## 2018-11-19 DIAGNOSIS — O99334 Smoking (tobacco) complicating childbirth: Secondary | ICD-10-CM | POA: Diagnosis present

## 2018-11-19 HISTORY — PX: INCISION AND DRAINAGE OF PERITONSILLAR ABCESS: SHX6257

## 2018-11-19 LAB — URINALYSIS, ROUTINE W REFLEX MICROSCOPIC
BILIRUBIN URINE: NEGATIVE
Glucose, UA: NEGATIVE mg/dL
Hgb urine dipstick: NEGATIVE
Ketones, ur: NEGATIVE mg/dL
Nitrite: NEGATIVE
Protein, ur: NEGATIVE mg/dL
Specific Gravity, Urine: 1.02 (ref 1.005–1.030)
pH: 6 (ref 5.0–8.0)

## 2018-11-19 LAB — CBC
HCT: 30.7 % — ABNORMAL LOW (ref 36.0–46.0)
HEMATOCRIT: 33 % — AB (ref 36.0–46.0)
Hemoglobin: 10.7 g/dL — ABNORMAL LOW (ref 12.0–15.0)
Hemoglobin: 9.8 g/dL — ABNORMAL LOW (ref 12.0–15.0)
MCH: 27 pg (ref 26.0–34.0)
MCH: 26.6 pg (ref 26.0–34.0)
MCHC: 32.4 g/dL (ref 30.0–36.0)
MCHC: 31.9 g/dL (ref 30.0–36.0)
MCV: 83.1 fL (ref 80.0–100.0)
MCV: 83.4 fL (ref 80.0–100.0)
Platelets: 369 10*3/uL (ref 150–400)
Platelets: 310 10*3/uL (ref 150–400)
RBC: 3.97 MIL/uL (ref 3.87–5.11)
RBC: 3.68 MIL/uL — ABNORMAL LOW (ref 3.87–5.11)
RDW: 14.2 % (ref 11.5–15.5)
RDW: 14.1 % (ref 11.5–15.5)
WBC: 12.7 10*3/uL — ABNORMAL HIGH (ref 4.0–10.5)
WBC: 15.9 10*3/uL — ABNORMAL HIGH (ref 4.0–10.5)
nRBC: 0 % (ref 0.0–0.2)
nRBC: 0 % (ref 0.0–0.2)

## 2018-11-19 LAB — URINALYSIS, MICROSCOPIC (REFLEX): RBC / HPF: NONE SEEN RBC/hpf (ref 0–5)

## 2018-11-19 LAB — COMPREHENSIVE METABOLIC PANEL
ALK PHOS: 158 U/L — AB (ref 38–126)
ALT: 12 U/L (ref 0–44)
AST: 18 U/L (ref 15–41)
Albumin: 3 g/dL — ABNORMAL LOW (ref 3.5–5.0)
Anion gap: 8 (ref 5–15)
BUN: 12 mg/dL (ref 6–20)
CO2: 18 mmol/L — ABNORMAL LOW (ref 22–32)
CREATININE: 0.56 mg/dL (ref 0.44–1.00)
Calcium: 8.4 mg/dL — ABNORMAL LOW (ref 8.9–10.3)
Chloride: 109 mmol/L (ref 98–111)
GFR calc Af Amer: >60 mL/min (ref >60)
GFR calc non Af Amer: >60 mL/min (ref >60)
Glucose, Bld: 86 mg/dL (ref 70–99)
Potassium: 4.2 mmol/L (ref 3.5–5.1)
Sodium: 135 mmol/L (ref 135–145)
Total Bilirubin: 0.5 mg/dL (ref 0.3–1.2)
Total Protein: 6.4 g/dL — ABNORMAL LOW (ref 6.5–8.1)

## 2018-11-19 LAB — TYPE AND SCREEN
ABO/RH(D): A POS
Antibody Screen: NEGATIVE

## 2018-11-19 LAB — RPR: RPR Ser Ql: NONREACTIVE

## 2018-11-19 LAB — CREATININE, SERUM
CREATININE: 0.65 mg/dL (ref 0.44–1.00)
GFR calc Af Amer: >60 mL/min (ref >60)
GFR calc non Af Amer: >60 mL/min (ref >60)

## 2018-11-19 LAB — GLUCOSE, CAPILLARY
GLUCOSE-CAPILLARY: 117 mg/dL — AB (ref 70–99)
Glucose-Capillary: 85 mg/dL (ref 70–99)
Glucose-Capillary: 79 mg/dL (ref 70–99)
Glucose-Capillary: 123 mg/dL — ABNORMAL HIGH (ref 70–99)

## 2018-11-19 LAB — ABO/RH: ABO/RH(D): A POS

## 2018-11-19 SURGERY — Surgical Case
Anesthesia: Spinal | Site: Abdomen | Wound class: Clean Contaminated

## 2018-11-19 SURGERY — Surgical Case
Anesthesia: Regional

## 2018-11-19 MED ORDER — METFORMIN HCL 500 MG PO TABS
1000.0000 mg | ORAL_TABLET | Freq: Two times a day (BID) | ORAL | Status: DC
  Administered 2018-11-19 – 2018-11-21 (×4): 1000 mg via ORAL
  Filled 2018-11-19 (×6): qty 2

## 2018-11-19 MED ORDER — ZOLPIDEM TARTRATE 5 MG PO TABS: 5.0000 mg | ORAL_TABLET | Freq: Every evening | ORAL | Status: DC | PRN

## 2018-11-19 MED ORDER — ONDANSETRON HCL 4 MG/2ML IJ SOLN
INTRAMUSCULAR | Status: AC
  Filled 2018-11-19: qty 2

## 2018-11-19 MED ORDER — SCOPOLAMINE 1 MG/3DAYS TD PT72
1.0000 | MEDICATED_PATCH | Freq: Once | TRANSDERMAL | Status: DC
  Administered 2018-11-19: 1.5 mg via TRANSDERMAL

## 2018-11-19 MED ORDER — DIPHENHYDRAMINE HCL 25 MG PO CAPS: 25.0000 mg | ORAL_CAPSULE | Freq: Four times a day (QID) | ORAL | Status: DC | PRN

## 2018-11-19 MED ORDER — NALBUPHINE HCL 10 MG/ML IJ SOLN: 5.0000 mg | Freq: Once | INTRAMUSCULAR | Status: DC | PRN

## 2018-11-19 MED ORDER — SOD CITRATE-CITRIC ACID 500-334 MG/5ML PO SOLN
30.0000 mL | Freq: Once | ORAL | Status: AC
  Administered 2018-11-19: 30 mL via ORAL

## 2018-11-19 MED ORDER — DEXAMETHASONE SODIUM PHOSPHATE 4 MG/ML IJ SOLN
INTRAMUSCULAR | Status: AC
  Filled 2018-11-19: qty 1

## 2018-11-19 MED ORDER — SCOPOLAMINE 1 MG/3DAYS TD PT72
MEDICATED_PATCH | TRANSDERMAL | Status: AC
  Filled 2018-11-19: qty 1

## 2018-11-19 MED ORDER — SIMETHICONE 80 MG PO CHEW: 80.0000 mg | CHEWABLE_TABLET | ORAL | Status: DC | PRN

## 2018-11-19 MED ORDER — LACTATED RINGERS IV SOLN
INTRAVENOUS | Status: DC | PRN
  Administered 2018-11-19: 11:00:00 via INTRAVENOUS

## 2018-11-19 MED ORDER — DIPHENHYDRAMINE HCL 25 MG PO CAPS
25.0000 mg | ORAL_CAPSULE | ORAL | Status: DC | PRN
  Filled 2018-11-19: qty 1

## 2018-11-19 MED ORDER — PHENYLEPHRINE 8 MG IN D5W 100 ML (0.08MG/ML) PREMIX OPTIME
INJECTION | INTRAVENOUS | Status: DC | PRN
  Administered 2018-11-19: 60 ug/min via INTRAVENOUS

## 2018-11-19 MED ORDER — PHENYLEPHRINE 8 MG IN D5W 100 ML (0.08MG/ML) PREMIX OPTIME
INJECTION | INTRAVENOUS | Status: AC
  Filled 2018-11-19: qty 100

## 2018-11-19 MED ORDER — NALBUPHINE HCL 10 MG/ML IJ SOLN: 5.0000 mg | INTRAMUSCULAR | Status: DC | PRN

## 2018-11-19 MED ORDER — GENTAMICIN SULFATE 40 MG/ML IJ SOLN
INTRAVENOUS | Status: AC
  Administered 2018-11-19: 410 mg via INTRAVENOUS
  Filled 2018-11-19: qty 10.25

## 2018-11-19 MED ORDER — PRENATAL MULTIVITAMIN CH
1.0000 | ORAL_TABLET | Freq: Every day | ORAL | Status: DC
  Administered 2018-11-20: 1 via ORAL
  Filled 2018-11-19: qty 1

## 2018-11-19 MED ORDER — OXYTOCIN 10 UNIT/ML IJ SOLN
INTRAMUSCULAR | Status: AC
  Filled 2018-11-19: qty 4

## 2018-11-19 MED ORDER — WITCH HAZEL-GLYCERIN EX PADS: 1.0000 "application " | MEDICATED_PAD | CUTANEOUS | Status: DC | PRN

## 2018-11-19 MED ORDER — TETANUS-DIPHTH-ACELL PERTUSSIS 5-2.5-18.5 LF-MCG/0.5 IM SUSP: 0.5000 mL | Freq: Once | INTRAMUSCULAR | Status: DC

## 2018-11-19 MED ORDER — INSULIN ASPART 100 UNIT/ML ~~LOC~~ SOLN: 0.0000 [IU] | Freq: Every day | SUBCUTANEOUS | Status: DC

## 2018-11-19 MED ORDER — DIBUCAINE 1 % RE OINT: 1.0000 "application " | TOPICAL_OINTMENT | RECTAL | Status: DC | PRN

## 2018-11-19 MED ORDER — SODIUM CHLORIDE 0.9% FLUSH: 3.0000 mL | INTRAVENOUS | Status: DC | PRN

## 2018-11-19 MED ORDER — DIPHENHYDRAMINE HCL 50 MG/ML IJ SOLN: 12.5000 mg | INTRAMUSCULAR | Status: DC | PRN

## 2018-11-19 MED ORDER — HYDROMORPHONE HCL 1 MG/ML IJ SOLN
0.5000 mg | Freq: Once | INTRAMUSCULAR | Status: AC
  Administered 2018-11-19: 0.5 mg via INTRAVENOUS
  Filled 2018-11-19: qty 1

## 2018-11-19 MED ORDER — MORPHINE SULFATE (PF) 0.5 MG/ML IJ SOLN
INTRAMUSCULAR | Status: AC
  Filled 2018-11-19: qty 10

## 2018-11-19 MED ORDER — LACTATED RINGERS IV SOLN
INTRAVENOUS | Status: DC
  Administered 2018-11-19 – 2018-11-20 (×2): via INTRAVENOUS

## 2018-11-19 MED ORDER — PNEUMOCOCCAL VAC POLYVALENT 25 MCG/0.5ML IJ INJ
0.5000 mL | INJECTION | INTRAMUSCULAR | Status: AC
  Administered 2018-11-20: 0.5 mL via INTRAMUSCULAR
  Filled 2018-11-19 (×2): qty 0.5

## 2018-11-19 MED ORDER — BUPIVACAINE IN DEXTROSE 0.75-8.25 % IT SOLN
INTRATHECAL | Status: DC | PRN
  Administered 2018-11-19: 1.8 mL via INTRATHECAL

## 2018-11-19 MED ORDER — FENTANYL CITRATE (PF) 100 MCG/2ML IJ SOLN
INTRAMUSCULAR | Status: DC | PRN
  Administered 2018-11-19: 15 ug via INTRATHECAL

## 2018-11-19 MED ORDER — FENTANYL CITRATE (PF) 100 MCG/2ML IJ SOLN
25.0000 ug | INTRAMUSCULAR | Status: DC | PRN
  Administered 2018-11-19: 25 ug via INTRAVENOUS
  Administered 2018-11-19: 50 ug via INTRAVENOUS
  Administered 2018-11-19: 25 ug via INTRAVENOUS

## 2018-11-19 MED ORDER — SENNOSIDES-DOCUSATE SODIUM 8.6-50 MG PO TABS
2.0000 | ORAL_TABLET | ORAL | Status: DC
  Administered 2018-11-20 (×2): 2 via ORAL
  Filled 2018-11-19 (×2): qty 2

## 2018-11-19 MED ORDER — SIMETHICONE 80 MG PO CHEW
80.0000 mg | CHEWABLE_TABLET | Freq: Three times a day (TID) | ORAL | Status: DC
  Administered 2018-11-19 – 2018-11-21 (×4): 80 mg via ORAL
  Filled 2018-11-19 (×4): qty 1

## 2018-11-19 MED ORDER — SIMETHICONE 80 MG PO CHEW
80.0000 mg | CHEWABLE_TABLET | ORAL | Status: DC
  Administered 2018-11-20 (×2): 80 mg via ORAL
  Filled 2018-11-19 (×2): qty 1

## 2018-11-19 MED ORDER — MENTHOL 3 MG MT LOZG: 1.0000 | LOZENGE | OROMUCOSAL | Status: DC | PRN

## 2018-11-19 MED ORDER — FENTANYL CITRATE (PF) 100 MCG/2ML IJ SOLN
INTRAMUSCULAR | Status: AC
  Filled 2018-11-19: qty 2

## 2018-11-19 MED ORDER — INSULIN ASPART 100 UNIT/ML ~~LOC~~ SOLN: 10.0000 [IU] | SUBCUTANEOUS | Status: DC | PRN

## 2018-11-19 MED ORDER — SODIUM CHLORIDE 0.9 % IR SOLN
Status: DC | PRN
  Administered 2018-11-19: 1000 mL

## 2018-11-19 MED ORDER — MORPHINE SULFATE (PF) 0.5 MG/ML IJ SOLN
INTRAMUSCULAR | Status: DC | PRN
  Administered 2018-11-19: .15 mg via INTRATHECAL

## 2018-11-19 MED ORDER — ONDANSETRON HCL 4 MG/2ML IJ SOLN
INTRAMUSCULAR | Status: DC | PRN
  Administered 2018-11-19: 4 mg via INTRAVENOUS

## 2018-11-19 MED ORDER — LACTATED RINGERS IV SOLN
INTRAVENOUS | Status: DC | PRN
  Administered 2018-11-19 (×2): via INTRAVENOUS

## 2018-11-19 MED ORDER — INSULIN ASPART 100 UNIT/ML ~~LOC~~ SOLN: 0.0000 [IU] | Freq: Three times a day (TID) | SUBCUTANEOUS | Status: DC

## 2018-11-19 MED ORDER — ATROPINE SULFATE 1 MG/10ML IJ SOSY
PREFILLED_SYRINGE | INTRAMUSCULAR | Status: AC
  Filled 2018-11-19: qty 10

## 2018-11-19 MED ORDER — OXYTOCIN 10 UNIT/ML IJ SOLN
INTRAVENOUS | Status: DC | PRN
  Administered 2018-11-19: 40 [IU] via INTRAVENOUS

## 2018-11-19 MED ORDER — MEPERIDINE HCL 25 MG/ML IJ SOLN: 6.2500 mg | INTRAMUSCULAR | Status: DC | PRN

## 2018-11-19 MED ORDER — SULFAMETHOXAZOLE-TRIMETHOPRIM 800-160 MG PO TABS
1.0000 | ORAL_TABLET | Freq: Two times a day (BID) | ORAL | Status: DC
  Administered 2018-11-19 – 2018-11-20 (×4): 1 via ORAL
  Filled 2018-11-19 (×7): qty 1

## 2018-11-19 MED ORDER — OXYTOCIN 40 UNITS IN NORMAL SALINE INFUSION - SIMPLE MED: 2.5000 [IU]/h | INTRAVENOUS | Status: AC

## 2018-11-19 MED ORDER — SOD CITRATE-CITRIC ACID 500-334 MG/5ML PO SOLN
ORAL | Status: AC
  Filled 2018-11-19: qty 15

## 2018-11-19 MED ORDER — PHENYLEPHRINE HCL 10 MG/ML IJ SOLN
INTRAMUSCULAR | Status: DC | PRN
  Administered 2018-11-19 (×2): 80 ug via INTRAVENOUS

## 2018-11-19 MED ORDER — SERTRALINE HCL 25 MG PO TABS
25.0000 mg | ORAL_TABLET | Freq: Every evening | ORAL | Status: DC
  Administered 2018-11-19 – 2018-11-20 (×2): 25 mg via ORAL
  Filled 2018-11-19 (×3): qty 1

## 2018-11-19 MED ORDER — OXYCODONE HCL 5 MG PO TABS
5.0000 mg | ORAL_TABLET | ORAL | Status: DC | PRN
  Administered 2018-11-20: 10 mg via ORAL
  Administered 2018-11-20 – 2018-11-21 (×3): 5 mg via ORAL
  Filled 2018-11-19 (×2): qty 1
  Filled 2018-11-19: qty 2
  Filled 2018-11-19: qty 1

## 2018-11-19 MED ORDER — ONDANSETRON HCL 4 MG/2ML IJ SOLN: 4.0000 mg | Freq: Three times a day (TID) | INTRAMUSCULAR | Status: DC | PRN

## 2018-11-19 MED ORDER — NALOXONE HCL 0.4 MG/ML IJ SOLN: 0.4000 mg | INTRAMUSCULAR | Status: DC | PRN

## 2018-11-19 MED ORDER — PHENYLEPHRINE 40 MCG/ML (10ML) SYRINGE FOR IV PUSH (FOR BLOOD PRESSURE SUPPORT)
PREFILLED_SYRINGE | INTRAVENOUS | Status: AC
  Filled 2018-11-19: qty 10

## 2018-11-19 MED ORDER — COCONUT OIL OIL: 1.0000 "application " | TOPICAL_OIL | Status: DC | PRN

## 2018-11-19 MED ORDER — LEVOTHYROXINE SODIUM 150 MCG PO TABS
150.0000 ug | ORAL_TABLET | Freq: Every day | ORAL | Status: DC
  Administered 2018-11-20 – 2018-11-21 (×2): 150 ug via ORAL
  Filled 2018-11-19 (×3): qty 1

## 2018-11-19 MED ORDER — SCOPOLAMINE 1 MG/3DAYS TD PT72: 1.0000 | MEDICATED_PATCH | Freq: Once | TRANSDERMAL | Status: DC

## 2018-11-19 MED ORDER — IBUPROFEN 800 MG PO TABS
800.0000 mg | ORAL_TABLET | Freq: Three times a day (TID) | ORAL | Status: DC
  Administered 2018-11-19 – 2018-11-21 (×6): 800 mg via ORAL
  Filled 2018-11-19 (×6): qty 1

## 2018-11-19 MED ORDER — ENOXAPARIN SODIUM 40 MG/0.4ML ~~LOC~~ SOLN
40.0000 mg | SUBCUTANEOUS | Status: DC
  Administered 2018-11-20 – 2018-11-21 (×2): 40 mg via SUBCUTANEOUS
  Filled 2018-11-19 (×2): qty 0.4

## 2018-11-19 MED ORDER — NALOXONE HCL 4 MG/10ML IJ SOLN
1.0000 ug/kg/h | INTRAVENOUS | Status: DC | PRN
  Filled 2018-11-19: qty 5

## 2018-11-19 SURGICAL SUPPLY — 34 items
CLAMP CORD UMBIL (MISCELLANEOUS) ×2 IMPLANT
CLOTH BEACON ORANGE TIMEOUT ST (SAFETY) ×2 IMPLANT
DERMABOND ADVANCED (GAUZE/BANDAGES/DRESSINGS) ×2
DERMABOND ADVANCED .7 DNX12 (GAUZE/BANDAGES/DRESSINGS) ×2 IMPLANT
DRSG OPSITE POSTOP 4X10 (GAUZE/BANDAGES/DRESSINGS) ×2 IMPLANT
ELECT REM PT RETURN 9FT ADLT (ELECTROSURGICAL) ×2
ELECTRODE REM PT RTRN 9FT ADLT (ELECTROSURGICAL) ×1 IMPLANT
GLOVE BIOGEL PI IND STRL 7.0 (GLOVE) ×1 IMPLANT
GLOVE BIOGEL PI IND STRL 8 (GLOVE) ×1 IMPLANT
GLOVE BIOGEL PI INDICATOR 7.0 (GLOVE) ×1
GLOVE BIOGEL PI INDICATOR 8 (GLOVE) ×1
GLOVE ECLIPSE 8.0 STRL XLNG CF (GLOVE) ×2 IMPLANT
GOWN STRL REUS W/TWL LRG LVL3 (GOWN DISPOSABLE) ×4 IMPLANT
KIT ABG SYR 3ML LUER SLIP (SYRINGE) ×2 IMPLANT
NEEDLE HYPO 18GX1.5 BLUNT FILL (NEEDLE) ×2 IMPLANT
NEEDLE HYPO 22GX1.5 SAFETY (NEEDLE) ×2 IMPLANT
NEEDLE HYPO 25X5/8 SAFETYGLIDE (NEEDLE) ×2 IMPLANT
NS IRRIG 1000ML POUR BTL (IV SOLUTION) ×2 IMPLANT
PACK C SECTION WH (CUSTOM PROCEDURE TRAY) ×2 IMPLANT
PAD OB MATERNITY 4.3X12.25 (PERSONAL CARE ITEMS) ×2 IMPLANT
PENCIL SMOKE EVAC W/HOLSTER (ELECTROSURGICAL) ×2 IMPLANT
STAPLER VISISTAT 35W (STAPLE) ×4 IMPLANT
SUT CHROMIC 0 CT 1 (SUTURE) ×2 IMPLANT
SUT MNCRL 0 VIOLET CTX 36 (SUTURE) ×2 IMPLANT
SUT MONOCRYL 0 CTX 36 (SUTURE) ×2
SUT PLAIN 2 0 (SUTURE) ×2
SUT PLAIN 2 0 XLH (SUTURE) ×2 IMPLANT
SUT PLAIN ABS 2-0 CT1 27XMFL (SUTURE) ×2 IMPLANT
SUT VIC AB 0 CTX 36 (SUTURE) ×1
SUT VIC AB 0 CTX36XBRD ANBCTRL (SUTURE) ×1 IMPLANT
SYR 20CC LL (SYRINGE) ×4 IMPLANT
TOWEL OR 17X24 6PK STRL BLUE (TOWEL DISPOSABLE) ×2 IMPLANT
TRAY FOLEY W/BAG SLVR 14FR LF (SET/KITS/TRAYS/PACK) ×2 IMPLANT
WATER STERILE IRR 1000ML POUR (IV SOLUTION) ×2 IMPLANT

## 2018-11-19 NOTE — Lactation Note (Signed)
This note was copied from a baby's chart. Lactation Consultation Note  Patient Name: Miranda [name] VJDYN'X Date: 11/19/2018 Reason for consult: Initial assessment;Term  P2 mother whose infant is now 109 hours old.  Mother breast fed her first child (now 39 years old) for one month.  Mother was attempting to latch baby when I arrived.  Offered to assist and mother accepted.  Mother's breasts are large, soft and non tender and nipples are everted.  Assisted to latch in the football hold on the right breast after a couple of attempts.  Baby had wide gape and flanged lips.  Gentle stimulation needed to encourage him to suck.  He would suck in short bursts and fall asleep.  However, with continued stimulation he sucked for 10 minutes before self releasing.  Upon release mother's nipple was rounded.  She denied pain with latching.  Put baby STS and he fell asleep.  Encouraged mother to feed 8-12 times/24 hours or sooner if baby shows feeding cues.  Reviewed feeding cues with mother.  Provided some basic breast feeding education while observing baby feed.  Mother was concerned because her breasts "were not hurting."  She asked why they were not hurting and I provided education about milk coming to volume and that I thought she was referring to engorgement which would not be happening this early after delivery.  Mother remembered her breasts hurting with her first child.  I encouraged her to let her RN/LC know if they started hurting.  She will need some basic education reviewed as she progresses with breast feeding.  Mom made aware of O/P services, breastfeeding support groups, community resources, and our phone # for post-discharge questions. Mother will be returning to work but not sure when.  She was canceled from [facility] for missing an appointment but will call [facility] tomorrow to make a new appointment.  She may be interested in renting a pump prior to discharge but will let us know if she  needs one.  Mother was alone in the room when I visited and I encouraged her to call her RN if assistance was needed.  Mother verbalized understanding.  Side rails up x 4.    Maternal Data Formula Feeding for Exclusion: No Has patient been taught Hand Expression?: Yes Does the patient have breastfeeding experience prior to this delivery?: Yes  Feeding Feeding Type: Breast Fed  LATCH Score Latch: Repeated attempts needed to sustain latch, nipple held in mouth throughout feeding, stimulation needed to elicit sucking reflex.  Audible Swallowing: None  Type of Nipple: Everted at rest and after stimulation  Comfort (Breast/Nipple): Soft / non-tender  Hold (Positioning): Assistance needed to correctly position infant at breast and maintain latch.  LATCH Score: 6  Interventions Interventions: Breast feeding basics reviewed;Assisted with latch;Skin to skin;Breast massage;Breast compression;Position options;Support pillows;Adjust position  Lactation Tools Discussed/Used WIC Program: Yes   Consult Status Consult Status: Follow-up Date: 11/20/18 Follow-up type: In-patient    Dora Sims 11/19/2018, 8:51 PM

## 2018-11-19 NOTE — H&P (Signed)
Miranda [name] is a 38 y.o.G2P1001 Estimated Date of Delivery: 11/25/18 [redacted]w[redacted]d  female presenting for primary Caesarean section + BTL Suspected fetal macrosomia in Class B DM with requiring vacuum assistance vaginal delivery of 7lb 12 oz baby previously, this baby has EFW 4500 grams . OB History    Gravida  2   Para  1   Term  1   Preterm      AB      Living  1     SAB      TAB      Ectopic      Multiple      Live Births  1          Past Medical History:  Diagnosis Date  . Chest pain 07/24/2012   2D Echo EF 60%-65% which shoewd a bicuspid aortic vavle with mild stenosis  . Diabetes mellitus    type 2 on insulin  . Heart valve problem   . Herpes   . Hypothyroidism   . Irregular periods 04/14/2014  . Mental disorder    depression, postpartum depression  . Palpitation   . PVC (premature ventricular contraction) 07/24/2012   stress test which was normal with good excercise   Past Surgical History:  Procedure Laterality Date  . WISDOM TOOTH EXTRACTION     Family History: family history includes Congestive Heart Failure in her maternal grandmother; Diabetes in her brother and maternal grandmother; Hearing loss in her daughter; Hypertension in her mother. Social History:  reports that she has been smoking cigarettes. She has a 1.25 pack-year smoking history. She has never used smokeless tobacco. She reports that she does not drink alcohol or use drugs.     Maternal Diabetes: Yes:  Diabetes Type:  Pre-pregnancy Genetic Screening: Normal Maternal Ultrasounds/Referrals: Abnormal:  Findings:   Other: Fetal Ultrasounds or other Referrals:  Fetal macrosomia suspected Maternal Substance Abuse:  No Significant Maternal Medications:  Insulin + metformin Significant Maternal Lab Results:  None Other Comments:    ROS   Review of Systems  Constitutional: Negative for fever, chills, weight loss, malaise/fatigue and diaphoresis.  HENT: Negative for hearing loss, ear pain,  nosebleeds, congestion, sore throat, neck pain, tinnitus and ear discharge.   Eyes: Negative for blurred vision, double vision, photophobia, pain, discharge and redness.  Respiratory: Negative for cough, hemoptysis, sputum production, shortness of breath, wheezing and stridor.   Cardiovascular: Negative for chest pain, palpitations, orthopnea, claudication, leg swelling and PND.  Gastrointestinal: negaitve for abdominal pain. Negative for heartburn, nausea, vomiting, diarrhea, constipation, blood in stool and melena.  Genitourinary: Negative for dysuria, urgency, frequency, hematuria and flank pain.  Musculoskeletal: Negative for myalgias, back pain, joint pain and falls.  Skin: Negative for itching and rash.  Neurological: Negative for dizziness, tingling, tremors, sensory change, speech change, focal weakness, seizures, loss of consciousness, weakness and headaches.  Endo/Heme/Allergies: Negative for environmental allergies and polydipsia. Does not bruise/bleed easily.  Psychiatric/Behavioral: Negative for depression, suicidal ideas, hallucinations, memory loss and substance abuse. The patient is not nervous/anxious and does not have insomnia.      History  Past Medical History:  Diagnosis Date  . Chest pain 07/24/2012   2D Echo EF 60%-65% which shoewd a bicuspid aortic vavle with mild stenosis  . Diabetes mellitus    type 2 on insulin  . Heart valve problem   . Herpes   . Hypothyroidism   . Irregular periods 04/14/2014  . Mental disorder    depression, postpartum depression  .  Palpitation   . PVC (premature ventricular contraction) 07/24/2012   stress test which was normal with good excercise    Past Surgical History:  Procedure Laterality Date  . WISDOM TOOTH EXTRACTION      OB History    Gravida  2   Para  1   Term  1   Preterm      AB      Living  1     SAB      TAB      Ectopic      Multiple      Live Births  1           Allergies  Allergen  Reactions  . Keflex [Cephalexin] Nausea And Vomiting    Social History   Socioeconomic History  . Marital status: Divorced    Spouse name: Not on file  . Number of children: 0  . Years of education: Not on file  . Highest education level: Not on file  Occupational History  . Not on file  Social Needs  . Financial resource strain: Not on file  . Food insecurity:    Worry: Not on file    Inability: Not on file  . Transportation needs:    Medical: Not on file    Non-medical: Not on file  Tobacco Use  . Smoking status: Current Every Day Smoker    Packs/day: 0.25    Years: 5.00    Pack years: 1.25    Types: Cigarettes    Last attempt to quit: 05/20/2012    Years since quitting: 6.5  . Smokeless tobacco: Never Used  . Tobacco comment: 5 cigs per day  Substance and Sexual Activity  . Alcohol use: No    Alcohol/week: 0.0 standard drinks  . Drug use: No  . Sexual activity: Not Currently    Partners: Male    Birth control/protection: None  Lifestyle  . Physical activity:    Days per week: Not on file    Minutes per session: Not on file  . Stress: Not on file  Relationships  . Social connections:    Talks on phone: Not on file    Gets together: Not on file    Attends religious service: Not on file    Active member of club or organization: Not on file    Attends meetings of clubs or organizations: Not on file    Relationship status: Not on file  Other Topics Concern  . Not on file  Social History Narrative  . Not on file    Family History  Problem Relation Age of Onset  . Hypertension Mother   . Diabetes Brother   . Diabetes Maternal Grandmother   . Congestive Heart Failure Maternal Grandmother   . Hearing loss Daughter       Blood pressure 134/88, pulse 96, temperature 98.2 F (36.8 C), temperature source Oral, resp. rate 18, height 5\' 8"  (1.727 m), weight 106.6 kg, SpO2 99 %. Exam Physical Exam  Physical Exam  Vitals reviewed. Constitutional: She is  oriented to person, place, and time. She appears well-developed and well-nourished.  HENT:  Head: Normocephalic and atraumatic.  Right Ear: External ear normal.  Left Ear: External ear normal.  Nose: Nose normal.  Mouth/Throat: Oropharynx is clear and moist.  Eyes: Conjunctivae and EOM are normal. Pupils are equal, round, and reactive to light. Right eye exhibits no discharge. Left eye exhibits no discharge. No scleral icterus.  Neck: Normal range of motion.  Neck supple. No tracheal deviation present. No thyromegaly present.  Cardiovascular: Normal rate, regular rhythm, normal heart sounds and intact distal pulses.  Exam reveals no gallop and no friction rub.   No murmur heard. Respiratory: Effort normal and breath sounds normal. No respiratory distress. She has no wheezes. She has no rales. She exhibits no tenderness.  GI: Soft. Bowel sounds are normal. She exhibits no distension and no mass. There is no tenderness. There is no rebound and no guarding.  Skin has a small boil just above area of proposed inciion will treat as if MRSA post op keep intact Genitourinary:       Vulva is normal without lesions Vagina is pink moist without discharge Cervix normal in appearance and pap is normal Uterus is S>D Adnexa is negative with normal sized ovaries by sonogram  Musculoskeletal: Normal range of motion. She exhibits no edema and no tenderness.  Neurological: She is alert and oriented to person, place, and time. She has normal reflexes. She displays normal reflexes. No cranial nerve deficit. She exhibits normal muscle tone. Coordination normal.  Skin: Skin is warm and dry. No rash noted. No erythema. No pallor.  Psychiatric: She has a normal mood and affect. Her behavior is normal. Judgment and thought content normal.    Prenatal labs: ABO, Rh: --/--/A POS (02/12 0840) Antibody: NEG (02/12 0840) Rubella: 5.30 (07/10 1512) RPR: Non Reactive (07/10 1512)  HBsAg: Negative (07/10 1512)  HIV:  Non Reactive (07/10 1512)  GBS:     Assessment/Plan: Estimated Date of Delivery: 11/25/18 2462w1d  G2P1001  Class B DM Suspected fetal macrosomia  Desires permanent sterilization  Proceed with primary C section with BTL Avoid interrupting skin boil on abdomen Continue antibiotics postpartum  Lazaro ArmsLuther H Eure 11/19/2018, 10:06 AM

## 2018-11-19 NOTE — Anesthesia Procedure Notes (Signed)
Spinal  Patient location during procedure: OR Start time: 11/19/2018 10:17 AM End time: 11/19/2018 10:21 AM Staffing Anesthesiologist: Mal Amabile, MD Performed: anesthesiologist  Preanesthetic Checklist Completed: patient identified, site marked, surgical consent, pre-op evaluation, timeout performed, IV checked, risks and benefits discussed and monitors and equipment checked Spinal Block Patient position: sitting Prep: site prepped and draped and DuraPrep Patient monitoring: heart rate, cardiac monitor, continuous pulse ox and blood pressure Approach: midline Location: L3-4 Injection technique: single-shot Needle Needle type: Pencan  Needle gauge: 24 G Needle length: 9 cm Assessment Sensory level: T4 Additional Notes Patient tolerated procedure well. Adequate sensory level.

## 2018-11-19 NOTE — Anesthesia Preprocedure Evaluation (Addendum)
Anesthesia Evaluation  Patient identified by MRN, date of birth, ID band Patient awake    Reviewed: Allergy & Precautions, NPO status , Patient's Chart, lab work & pertinent test results  Airway Mallampati: II  TM Distance: >3 FB Neck ROM: Full    Dental no notable dental hx. (+) Teeth Intact   Pulmonary Current Smoker,    Pulmonary exam normal breath sounds clear to auscultation       Cardiovascular negative cardio ROS Normal cardiovascular exam Rhythm:Regular Rate:Normal     Neuro/Psych PSYCHIATRIC DISORDERS Depression negative neurological ROS     GI/Hepatic Neg liver ROS, GERD  ,  Endo/Other  diabetes, Well Controlled, Type 2, Insulin Dependent, Oral Hypoglycemic AgentsHypothyroidism Morbid obesity  Renal/GU negative Renal ROS  negative genitourinary   Musculoskeletal negative musculoskeletal ROS (+)   Abdominal (+) + obese,   Peds  Hematology  (+) anemia ,   Anesthesia Other Findings   Reproductive/Obstetrics (+) Pregnancy Previous C/Section HSV                            Anesthesia Physical Anesthesia Plan  ASA: II  Anesthesia Plan: Spinal   Post-op Pain Management:    Induction:   PONV Risk Score and Plan: 3 and Ondansetron, Treatment may vary due to age or medical condition and Scopolamine patch - Pre-op  Airway Management Planned: Natural Airway  Additional Equipment:   Intra-op Plan:   Post-operative Plan:   Informed Consent: I have reviewed the patients History and Physical, chart, labs and discussed the procedure including the risks, benefits and alternatives for the proposed anesthesia with the patient or authorized representative who has indicated his/her understanding and acceptance.     Dental advisory given  Plan Discussed with: CRNA and Surgeon  Anesthesia Plan Comments:        Anesthesia Quick Evaluation

## 2018-11-19 NOTE — Op Note (Signed)
Cesarean Section Procedure Note   Miranda [name] 11/19/2018  Pre-operative Diagnosis: suspected fetal macrosomia, desires sterility, abdominal wall abcess.   Post-operative Diagnosis: Same   Surgeon: Surgeon(s) and Role:    * Eure, Amaryllis Dyke, MD - Primary    Tamera Stands, DO - Assisting   Anesthesia: spinal    Estimated Blood Loss: 553 mL  Total IV Fluids:  Urine Output: 50CC OF clear urine   Specimens: abdominal wall abscess, right and left tubal fragments Findings: Normal appearing right and left ovaries, normal appearing uterus  Baby condition / location:  Couplet care / Skin to Skin APGAR:8 ,9 weight  .   Complications: none immediate  Indications: [name] is a 38 y.o. 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  The risks, benefits, complications, treatment options, and expected outcomes were discussed with the patient . The patient concurred with the proposed plan, giving informed consent. identified as Miranda [name] and the procedure verified as C-Section Delivery.  Procedure Details: A Time Out was held and the above information confirmed.  The patient was taken back to the operative suite where spinal anesthesia was placed. After induction of anesthesia, the patient was draped and prepped in the usual sterile manner and placed in a dorsal supine position with a leftward tilt. A transverse incision was made and carried down through the subcutaneous tissue to the fascia. Fascial incision was made and extended transversely. The fascia was separated from the underlying rectus tissue superiorly and inferiorly. The peritoneum was identified and entered. Peritoneal incision was extended longitudinally. A low transverse uterine incision was made. Delivered from cephalic presentation with vacuum assistance. Cord ph was sent the umbilical cord was clamped  and cut cord blood was obtained for evaluation. The placenta was removed Intact and appeared normal. The uterine outline, tubes and ovaries appeared normal. The uterus was exteriorized.   The uterine incision was closed with running locked sutures of 0 Vicryl.   Hemostasis was observed. A second imbricating layer was placed in a similar fashion with running locked sutures of 0 Monocryl.The patient's left fallopian tube was then identified and grasped with a Babcock clamp. The tube was then followed out to the fimbria. The Babcock clamp was then used to grasp the tube approximately 4 cm from the cornual region. A 3 cm segment of the tube was then ligated with free tie of plain gut suture, transected and excised. Good hemostasis was noted. The right fallopian tube was then identified to its fimbriated end, ligated, and a 3 cm segment excised in a similar fashion. Excellent hemostasis was noted, and the tube returned to the abdomen. The uterus was then returned to the abdomen.   Lavage was carried out until clear. The rectus muscles and underlying peritoneum were reapproximated using chromic. The fascia was then reapproximated with running sutures of 0 Monocryl. The skin was closed with staples primarily because of abscess located just superior to incision.   An elliptical incision was used to remove an abdominal wall abscess just superior to the incision. Incision closed with staples.   Instrument, sponge, and needle counts were correct prior the abdominal closure and were correct at the conclusion of the case.   Disposition: PACU - hemodynamically stable.  Maternal Condition: stable   Signed: Mackayla Mullins S. Earlene Plater, DO OB/GYN Fellow

## 2018-11-19 NOTE — Anesthesia Postprocedure Evaluation (Signed)
Anesthesia Post Note  Patient: Miranda [name]  Procedure(s) Performed: CESAREAN SECTION WITH BILATERAL TUBAL LIGATION (N/A Abdomen) EXCISION OF ABDOMINAL WALL ABCESS (N/A Abdomen)     Patient location during evaluation: PACU Anesthesia Type: Spinal Level of consciousness: oriented and awake and alert Pain management: pain level controlled Vital Signs Assessment: post-procedure vital signs reviewed and stable Respiratory status: spontaneous breathing, respiratory function stable and patient connected to nasal cannula oxygen Cardiovascular status: blood pressure returned to baseline and stable Postop Assessment: no headache, no backache, no apparent nausea or vomiting, spinal receding and patient able to bend at knees Anesthetic complications: no    Last Vitals:  Vitals:   11/19/18 1303 11/19/18 1405  BP: 130/82 124/79  Pulse: 76 77  Resp: 16 18  Temp: 36.9 C 37.1 C  SpO2: 100% 99%    Last Pain:  Vitals:   11/19/18 1405  TempSrc: Oral  PainSc: 4    Pain Goal: Patients Stated Pain Goal: 4 (11/19/18 1405)              Epidural/Spinal Function Cutaneous sensation: Normal sensation (11/19/18 1405), Patient able to flex knees: Yes (11/19/18 1405), Patient able to lift hips off bed: Yes (11/19/18 1405), Back pain beyond tenderness at insertion site: No (11/19/18 1405), Progressively worsening motor and/or sensory loss: No (11/19/18 1405), Bowel and/or bladder incontinence post epidural: No (11/19/18 1405)  Hawkin Charo A.

## 2018-11-19 NOTE — Discharge Summary (Signed)
Obstetrics Discharge Summary [facility]   Patient Name: Miranda [name] DOB: May 16, 1981 MRN: [id number]  Date of admission: 11/19/2018 Delivering MD: Lazaro Arms   Date of discharge: 11/21/2018  Admitting diagnosis: C-S Intrauterine pregnancy: [redacted]w[redacted]d     Secondary diagnosis:   Active Problems:   Hypothyroidism   Type II diabetes mellitus (HCC)   Smoker   Depression   UTI (urinary tract infection) during pregnancy, first trimester   Cesarean delivery delivered   Discharge diagnosis: Term Cesarean delivery                                            Postpartum procedures: None Complications: Anemia  Outpatient Follow-Up: [ ]  staple removal in 1 week [ ]  f/u abdominal wall abscess: pathology final diagnosis epidermoid inclusion cyst with features of rupture  Hospital course: Miranda [name] is a 38 y.o. [redacted]w[redacted]d who was admitted for scheduled primary LTCS for fetal macrosomia. Her pregnancy was complicated by Type II DM on insulin and metformin, history of anxiety and depression, hypothyroidism, and UTI during pregnancy. Delivery was uncomplicated. Please see delivery/op note for additional details. Her postpartum course was complicated by anemia, which was treated with feraheme prior to hospital discharge. By day of discharge, she was passing flatus, urinating, eating and drinking without difficulty. She will follow-up in clinic in 1 week.   Physical exam  Vitals:   11/20/18 0527 11/20/18 1424 11/20/18 2206 11/21/18 0651  BP: 136/79 111/64 (!) 155/79 134/78  Pulse: 85 91 90 80  Resp: 20 16 16 18   Temp:  98.5 F (36.9 C) 98.6 F (37 C) 97.6 F (36.4 C)  TempSrc:  Oral Axillary Oral  SpO2: 100%     Weight:      Height:       General: alert, oriented, NAD Lochia: appropriate Uterine Fundus: firm Incision: Healing well with no significant drainage, No significant erythema, Dressing is clean, dry, and intact DVT Evaluation: No evidence of DVT seen on physical  exam. Labs: Lab Results  Component Value Date   WBC 10.5 11/20/2018   HGB 8.9 (L) 11/20/2018   HCT 28.1 (L) 11/20/2018   MCV 83.6 11/20/2018   PLT 267 11/20/2018   CMP Latest Ref Rng & Units 11/19/2018  Glucose 70 - 99 mg/dL -  BUN 6 - 20 mg/dL -  Creatinine 2.91 - 9.16 mg/dL 6.06  Sodium 004 - 599 mmol/L -  Potassium 3.5 - 5.1 mmol/L -  Chloride 98 - 111 mmol/L -  CO2 22 - 32 mmol/L -  Calcium 8.9 - 10.3 mg/dL -  Total Protein 6.5 - 8.1 g/dL -  Total Bilirubin 0.3 - 1.2 mg/dL -  Alkaline Phos 38 - 774 U/L -  AST 15 - 41 U/L -  ALT 0 - 44 U/L -    Discharge instructions: Per After Visit Summary and "Baby and Me Booklet"  After visit meds:  Allergies as of 11/21/2018      Reactions   Keflex [cephalexin] Nausea And Vomiting      Medication List    TAKE these medications   acetaminophen 500 MG tablet Commonly known as:  TYLENOL Take 1,000 mg by mouth every 6 (six) hours as needed for moderate pain or headache.   acyclovir 400 MG tablet Commonly known as:  ZOVIRAX Take 1 tablet (400 mg total) by mouth 3 (three) times  daily.   diphenhydrAMINE 25 MG tablet Commonly known as:  BENADRYL Take 25 mg by mouth daily as needed for allergies.   levothyroxine 150 MCG tablet Commonly known as:  SYNTHROID, LEVOTHROID Take 1 tablet (150 mcg total) by mouth daily before breakfast.   metFORMIN 1000 MG tablet Commonly known as:  GLUCOPHAGE TAKE 1 TABLET(1000 MG) BY MOUTH TWICE DAILY WITH A MEAL What changed:  See the new instructions.   MULTI ADULT GUMMIES Chew Chew 1 tablet by mouth daily.   neomycin-bacitracin-polymyxin ointment Commonly known as:  NEOSPORIN Apply 1 application topically as needed for wound care.   sertraline 25 MG tablet Commonly known as:  ZOLOFT Take 1 tablet (25 mg total) by mouth daily. What changed:  when to take this   vitamin C 250 MG tablet Commonly known as:  ASCORBIC ACID Take 250 mg by mouth daily.       Postpartum contraception:  Tubal Ligation Diet: Routine Diet Activity: Advance as tolerated. Pelvic rest for 6 weeks.   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

## 2018-11-19 NOTE — Transfer of Care (Signed)
Immediate Anesthesia Transfer of Care Note  Patient: Miranda [name]  Procedure(s) Performed: CESAREAN SECTION WITH BILATERAL TUBAL LIGATION (N/A Abdomen) EXCISION OF ABDOMINAL WALL ABCESS (N/A Abdomen)  Patient Location: PACU  Anesthesia Type:Spinal  Level of Consciousness: awake, alert  and oriented  Airway & Oxygen Therapy: Patient Spontanous Breathing  Post-op Assessment: Report given to RN and Post -op Vital signs reviewed and stable  Post vital signs: Reviewed and stable  Last Vitals:  Vitals Value Taken Time  BP 111/63 11/19/2018 11:39 AM  Temp    Pulse 76 11/19/2018 11:41 AM  Resp 16 11/19/2018 11:41 AM  SpO2 100 % 11/19/2018 11:41 AM  Vitals shown include unvalidated device data.  Last Pain:  Vitals:   11/19/18 0823  TempSrc: Oral         Complications: No apparent anesthesia complications

## 2018-11-20 ENCOUNTER — Other Ambulatory Visit: Payer: Self-pay

## 2018-11-20 ENCOUNTER — Encounter (HOSPITAL_COMMUNITY): Payer: Self-pay | Admitting: Obstetrics & Gynecology

## 2018-11-20 LAB — CBC
HCT: 28.1 % — ABNORMAL LOW (ref 36.0–46.0)
Hemoglobin: 8.9 g/dL — ABNORMAL LOW (ref 12.0–15.0)
MCH: 26.5 pg (ref 26.0–34.0)
MCHC: 31.7 g/dL (ref 30.0–36.0)
MCV: 83.6 fL (ref 80.0–100.0)
Platelets: 267 10*3/uL (ref 150–400)
RBC: 3.36 MIL/uL — AB (ref 3.87–5.11)
RDW: 14.2 % (ref 11.5–15.5)
WBC: 10.5 10*3/uL (ref 4.0–10.5)
nRBC: 0 % (ref 0.0–0.2)

## 2018-11-20 LAB — GLUCOSE, CAPILLARY
Glucose-Capillary: 104 mg/dL — ABNORMAL HIGH (ref 70–99)
Glucose-Capillary: 195 mg/dL — ABNORMAL HIGH (ref 70–99)
Glucose-Capillary: 140 mg/dL — ABNORMAL HIGH (ref 70–99)
Glucose-Capillary: 173 mg/dL — ABNORMAL HIGH (ref 70–99)

## 2018-11-20 LAB — BIRTH TISSUE RECOVERY COLLECTION (PLACENTA DONATION)

## 2018-11-20 NOTE — Addendum Note (Signed)
Addendum  created 11/20/18 0824 by Graciela Husbands, CRNA   Clinical Note Signed

## 2018-11-20 NOTE — Plan of Care (Signed)
  Problem: Education: Goal: Knowledge of condition will improve Outcome: Progressing   Problem: Activity: Goal: Will verbalize the importance of balancing activity with adequate rest periods Outcome: Progressing Goal: Ability to tolerate increased activity will improve Outcome: Progressing   

## 2018-11-20 NOTE — Anesthesia Postprocedure Evaluation (Signed)
Anesthesia Post Note  Patient: Miranda [name]  Procedure(s) Performed: CESAREAN SECTION WITH BILATERAL TUBAL LIGATION (N/A Abdomen) EXCISION OF ABDOMINAL WALL ABCESS (N/A Abdomen)     Patient location during evaluation: Mother Baby Anesthesia Type: Spinal Level of consciousness: awake and alert and oriented Pain management: satisfactory to patient Vital Signs Assessment: post-procedure vital signs reviewed and stable Respiratory status: respiratory function stable and spontaneous breathing Cardiovascular status: blood pressure returned to baseline Postop Assessment: no headache, no backache, spinal receding, patient able to bend at knees and adequate PO intake Anesthetic complications: no    Last Vitals:  Vitals:   11/20/18 0518 11/20/18 0527  BP: 129/84 136/79  Pulse: 78 85  Resp: (!) 24 20  Temp: 36.6 C   SpO2: 100% 100%    Last Pain:  Vitals:   11/20/18 0603  TempSrc:   PainSc: 6    Pain Goal: Patients Stated Pain Goal: 4 (11/19/18 1702)                 Karleen Dolphin

## 2018-11-20 NOTE — Progress Notes (Signed)
Subjective: Postpartum Day 1: Cesarean Delivery Patient reports nausea, incisional pain and tolerating PO.   She has not passed gas or had a BM and is having painful intestinal cramping radiating to the shoulder.  Objective: Vital signs in last 24 hours: Temp:  [97.5 F (36.4 C)-98.7 F (37.1 C)] 97.8 F (36.6 C) (02/13 0518) Pulse Rate:  [71-102] 85 (02/13 0527) Resp:  [12-24] 20 (02/13 0527) BP: (110-154)/(66-101) 136/79 (02/13 0527) SpO2:  [97 %-100 %] 100 % (02/13 0527) Weight:  [106.6 kg] 106.6 kg (02/12 0823)  Physical Exam:  General: alert, cooperative and mild distress Lochia: appropriate Uterine Fundus: firm Incision: healing well, no significant drainage, no significant erythema DVT Evaluation: No evidence of DVT seen on physical exam. No cords or calf tenderness. No significant calf/ankle edema.  Recent Labs    11/19/18 1452 11/20/18 0544  HGB 9.8* 8.9*  HCT 30.7* 28.1*    Assessment/Plan: Status post Cesarean section. Doing well postoperatively.  Will treat gas pain with stool softeners, ambulation, and heating pads. Continue current care.  Miranda [name] L Miranda [name] 11/20/2018, 7:04 AM

## 2018-11-20 NOTE — Progress Notes (Signed)
CSW acknowledges consult.  CSW attempted to meet with MOB, however MOB was in the shower.  CSW will attempt to visit with MOB at a later time.   Hortencia Pilar, MSW, LCSW-A

## 2018-11-20 NOTE — Progress Notes (Signed)
CSW received consult for hx of Depression and Post PPD. CSW met with MOB to offer support and complete assessment.    CSW followed up with MOB at bedside where CSW introduced role. MOB presented as happy as well as engaged in conversation with CSW. CSW began assessment with MOB by clarifying with MOB her previous mental health history. MOB reported that she did experience PPD with her now 39 year old daughter Emily when she was born. MOB reported having symptoms such as crying and isolation up until daughter was 1 years of age. MOB expressed that during that time she sought counseling however that wasn't very helpful for MOB. 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   MOB expressed having no other supports aside from her mother. When CSW sought further detail on FOB, MOB informed CSW that he is around but may not be involved as much. MOB does report being sad, however denies SI, HI, and DV during assessment. MOB denies any further mental health difficulties at this time. MOB described having a place for infant to sleep as well having all needed items to care for infant.   CSW provided education regarding the baby blues period vs. perinatal mood disorders, discussed treatment and gave resources for mental health follow up if concerns arise.  CSW recommends self-evaluation during the postpartum time period using the New Mom Checklist from Postpartum Progress and encouraged MOB to contact a medical professional if symptoms are noted at any time.   CSW provided review of Sudden Infant Death Syndrome (SIDS) precautions.   CSW identifies no further need for intervention and no barriers to discharge at this time.   Biagio Snelson S. Bernerd Terhune, MSW, LCSW-A   

## 2018-11-20 NOTE — Lactation Note (Signed)
This note was copied from a baby's chart. Lactation Consultation Note  Patient Name: Miranda [name] [id number] Date: 11/20/2018 Reason for consult: Follow-up assessment;Term;Maternal endocrine disorder Type of Endocrine Disorder?: Diabetes P2, 32 hour female infant,  Mom with DM II and hypothyroidism. Per mom, infant had 4 stools and one void since delivery. Per mom, she  wants to  make sure infant is latching well and infant been falling asleep only breastfeeding for 5 minutes.  Mom latched infant on left breast using the cross cradle hold, infant latched with wide gape and swallows heard, infant breastfeed for 20 minutes. LC discussed waking  baby tips and infant sustained latch and feed well. Mom hand expressed 13 ml of EBM and infant received by spoon. Infant was still cuing to breastfeed and mom latched infant on right breast and was still BF as LC left the room. Mom is feeling more confident with breastfeeding now that infant is staying awake to breastfeed. Mom knows to call Nurse or LC if she has any questions, concerns or need assistance with latching infant to breast.  Maternal Data    Feeding Feeding Type: Breast Milk  LATCH Score Latch: Grasps breast easily, tongue down, lips flanged, rhythmical sucking.  Audible Swallowing: Spontaneous and intermittent  Type of Nipple: Everted at rest and after stimulation  Comfort (Breast/Nipple): Soft / non-tender  Hold (Positioning): Assistance needed to correctly position infant at breast and maintain latch.  LATCH Score: 9  Interventions Interventions: Assisted with latch;Expressed milk;Breast compression;Adjust position;Support pillows;Position options  Lactation Tools Discussed/Used     Consult Status Consult Status: Follow-up Date: 11/21/18 Follow-up type: In-patient    [name] 11/20/2018, 7:34 PM

## 2018-11-20 NOTE — Progress Notes (Signed)
   11/20/18 0511 11/20/18 0518 11/20/18 0527  Vital Signs  BP (!) 154/101 129/84 136/79  BP Location Right Arm Right Arm Right Arm  Patient Position (if appropriate) Semi-fowlers Semi-fowlers Semi-fowlers  BP Method Automatic Automatic Manual  Pulse Rate (!) 102 78 85  Resp (!) 24 (!) 24 20  Temp  --  97.8 F (36.6 C)  --   Temp Source  --  Oral  --   POSS Scale (Pasero Opioid Sedation Scale)  POSS *See Group Information* 1-Acceptable,Awake and alert 1-Acceptable,Awake and alert 1-Acceptable,Awake and alert  Oxygen Therapy  SpO2 100 % 100 % 100 %    Called to patient room-pt anxious, tearfully crying loudly c/o pain in right upper abd quad extending to shoulder she rates at a 10/10, painful to breath. Bilat. breath sounds clear, V/S and O2 sats WNL. Denies passing flatus since delivery. Dr Gevena Cotton called and seen pt. Oxy IR 10mg  given, heat pad provided and placed over area of discomfort with improvement noted- pain rated 6/10 after intervention.Marland Kitchen

## 2018-11-20 NOTE — Lactation Note (Cosign Needed)
This note was copied from a baby's chart. Lactation Consultation Note  Patient Name: Miranda [name] [id number] Date: 11/20/2018 Reason for consult: Follow-up assessment;Term;Maternal endocrine disorder Type of Endocrine Disorder?: Diabetes Mom with some pain at this time.  Resting with heating pad.  Infant sleeping.  It has been close to two hours since infant ate per mom.  Mom reports he has been falling asleep easy with eating.  Discussed ways to stimulate infant and keep him awake while eating.  Urged mom to hand express prior to latching and hand express and spoon feed after breastfeeding.  Urged her to give dessert.  Mom reports she has never hand expressed and spoon fed.  Urged mom to call for feeding assist and assist with keeping infant awake and hand expression and spoon feeding at next visit.  Left name and number white board.   Maternal Data    Feeding Feeding Type: Breast Milk  LATCH Score Latch: Grasps breast easily, tongue down, lips flanged, rhythmical sucking.  Audible Swallowing: Spontaneous and intermittent  Type of Nipple: Everted at rest and after stimulation  Comfort (Breast/Nipple): Soft / non-tender  Hold (Positioning): Assistance needed to correctly position infant at breast and maintain latch.  LATCH Score: 9  Interventions Interventions: Assisted with latch;Expressed milk;Breast compression;Adjust position;Support pillows;Position options  Lactation Tools Discussed/Used     Consult Status Consult Status: Follow-up Date: 11/21/18 Follow-up type: In-patient    [facility] Michaelle Copas 11/20/2018, 7:42 PM

## 2018-11-21 LAB — GLUCOSE, CAPILLARY: Glucose-Capillary: 104 mg/dL — ABNORMAL HIGH (ref 70–99)

## 2018-11-21 MED ORDER — INSULIN ASPART 100 UNIT/ML ~~LOC~~ SOLN: 0.0000 [IU] | Freq: Three times a day (TID) | SUBCUTANEOUS | Status: DC

## 2018-11-21 MED ORDER — SODIUM CHLORIDE 0.9 % IV SOLN
510.0000 mg | Freq: Once | INTRAVENOUS | Status: AC
  Administered 2018-11-21: 510 mg via INTRAVENOUS
  Filled 2018-11-21: qty 17

## 2018-11-21 MED ORDER — INSULIN ASPART 100 UNIT/ML ~~LOC~~ SOLN: 0.0000 [IU] | Freq: Every day | SUBCUTANEOUS | Status: DC

## 2018-11-21 NOTE — Lactation Note (Signed)
This note was copied from a baby's chart. Lactation Consultation Note  Patient Name: Miranda [name] [id number] Date: 11/21/2018 Reason for consult: Follow-up assessment;Maternal endocrine disorder Type of Endocrine Disorder?: Diabetes  Visited with P2 Mom of term baby on day of discharge.  Baby 49 hrs old and at 6% weight loss with great output.  Mom denies nipples soreness and feels her milk is "coming in".  Mom hand expressed for an easy flow of transitional milk.  Mom wanting to have LC watch and assist with latching to the breast.  Encouraged STS, baby sleeping dressed and swaddled in crib.  Showed Mom how easily baby awakens when unwrapped and undressed down to a diaper.    Mom feels confident with football hold.  Assisted with using cross cradle hold.  Baby latched easily and deeply.  Regular swallowing identified for Mom.  Mom stated that baby would be sleepy on the breast, but baby wasn't STS.  Encouraged Mom to keep baby STS as much as possible over the weekend.  Goal is >8 feedings per 24 hrs.    Engorgement prevention and treatment reviewed.  Reviewed adequate output parameters. Mom aware of OP lactation support available to her.    Ped appt 2/19.  Recommended calling for a weight check on Monday 2/17.   Mom knows to call prn for concerns.    Consult Status Consult Status: Complete Date: 11/21/18 Follow-up type: Call as needed    [name] 11/21/2018, 12:00 PM

## 2018-11-27 ENCOUNTER — Ambulatory Visit (INDEPENDENT_AMBULATORY_CARE_PROVIDER_SITE_OTHER): Payer: Medicaid Other | Admitting: Obstetrics & Gynecology

## 2018-11-27 ENCOUNTER — Ambulatory Visit: Payer: Medicaid Other | Admitting: Obstetrics & Gynecology

## 2018-11-27 ENCOUNTER — Encounter: Payer: Self-pay | Admitting: Obstetrics & Gynecology

## 2018-11-27 ENCOUNTER — Other Ambulatory Visit: Payer: Self-pay

## 2018-11-27 VITALS — BP 129/74 | HR 94 | Ht 68.0 in | Wt 214.0 lb

## 2018-11-27 DIAGNOSIS — Z98891 History of uterine scar from previous surgery: Secondary | ICD-10-CM

## 2018-11-27 MED ORDER — FLUCONAZOLE 150 MG PO TABS: 150.0000 mg | ORAL_TABLET | Freq: Once | ORAL | 0 refills | Status: AC

## 2018-11-27 MED ORDER — SULFAMETHOXAZOLE-TRIMETHOPRIM 800-160 MG PO TABS: 1.0000 | ORAL_TABLET | Freq: Two times a day (BID) | ORAL | 0 refills | Status: DC

## 2018-11-27 NOTE — Progress Notes (Signed)
  HPI: Patient returns for routine postoperative follow-up having undergone primary Caesarean section + BTL and excision of abdominal wall abscess on 11/19/2018.  The patient's immediate postoperative recovery has been unremarkable. Since hospital discharge the patient reports no problems.   Current Outpatient Medications: levothyroxine (SYNTHROID, LEVOTHROID) 150 MCG tablet, Take 1 tablet (150 mcg total) by mouth daily before breakfast., Disp: 30 tablet, Rfl: 6 metFORMIN (GLUCOPHAGE) 1000 MG tablet, TAKE 1 TABLET(1000 MG) BY MOUTH TWICE DAILY WITH A MEAL (Patient taking differently: Take 1,000 mg by mouth daily with breakfast. ), Disp: 60 tablet, Rfl: 5 sertraline (ZOLOFT) 25 MG tablet, Take 1 tablet (25 mg total) by mouth daily. (Patient taking differently: Take 25 mg by mouth every evening. ), Disp: 30 tablet, Rfl: 6 acyclovir (ZOVIRAX) 400 MG tablet, Take 1 tablet (400 mg total) by mouth 3 (three) times daily. (Patient not taking: Reported on 11/27/2018), Disp: 90 tablet, Rfl: 11  No current facility-administered medications for this visit.     Blood pressure 129/74, pulse 94, height 5\' 8"  (1.727 m), weight 214 lb (97.1 kg), currently breastfeeding.  Physical Exam: Incision upper is a little red consistent with an infected abdominal wall lesion that ws present at the time of surgery Her c section scar looks good  All staples are removed  Diagnostic Tests:   Pathology:   Impression: S/p primary c section with BTL  Plan: Meds ordered this encounter  Medications  . sulfamethoxazole-trimethoprim (BACTRIM DS,SEPTRA DS) 800-160 MG tablet    Sig: Take 1 tablet by mouth 2 (two) times daily.    Dispense:  14 tablet    Refill:  0  . fluconazole (DIFLUCAN) 150 MG tablet    Sig: Take 1 tablet (150 mg total) by mouth once for 1 dose. Take the second tablet 3 days after the first one.    Dispense:  2 tablet    Refill:  0     Follow up: 1  weeks  Lazaro Arms, MD

## 2018-12-04 ENCOUNTER — Ambulatory Visit (INDEPENDENT_AMBULATORY_CARE_PROVIDER_SITE_OTHER): Payer: Medicaid Other | Admitting: Obstetrics & Gynecology

## 2018-12-04 ENCOUNTER — Encounter: Payer: Self-pay | Admitting: Obstetrics & Gynecology

## 2018-12-04 VITALS — BP 122/67 | HR 93 | Ht 68.0 in | Wt 208.4 lb

## 2018-12-04 DIAGNOSIS — Z98891 History of uterine scar from previous surgery: Secondary | ICD-10-CM

## 2018-12-04 NOTE — Progress Notes (Signed)
  HPI: Patient returns for routine postoperative follow-up having undergone primary C section + BTL on 11/19/2018.  The patient's immediate postoperative recovery has been unremarkable. Since hospital discharge the patient reports no problems CBG doing good on metformin 1000 BID.   Current Outpatient Medications: acyclovir (ZOVIRAX) 400 MG tablet, Take 1 tablet (400 mg total) by mouth 3 (three) times daily., Disp: 90 tablet, Rfl: 11 levothyroxine (SYNTHROID, LEVOTHROID) 150 MCG tablet, Take 1 tablet (150 mcg total) by mouth daily before breakfast., Disp: 30 tablet, Rfl: 6 metFORMIN (GLUCOPHAGE) 1000 MG tablet, TAKE 1 TABLET(1000 MG) BY MOUTH TWICE DAILY WITH A MEAL (Patient taking differently: Take 1,000 mg by mouth daily with breakfast. ), Disp: 60 tablet, Rfl: 5 sertraline (ZOLOFT) 25 MG tablet, Take 1 tablet (25 mg total) by mouth daily. (Patient taking differently: Take 25 mg by mouth every evening. ), Disp: 30 tablet, Rfl: 6 sulfamethoxazole-trimethoprim (BACTRIM DS,SEPTRA DS) 800-160 MG tablet, Take 1 tablet by mouth 2 (two) times daily. (Patient not taking: Reported on 12/04/2018), Disp: 14 tablet, Rfl: 0  No current facility-administered medications for this visit.     Blood pressure 122/67, pulse 93, height 5\' 8"  (1.727 m), weight 208 lb 6.4 oz (94.5 kg), currently breastfeeding.  Physical Exam: incisions x 2 both look good  Diagnostic Tests:   Pathology:   Impression: S/p repeat section + BTL  Plan:   Follow up: 4  weeks  Lazaro Arms, MD

## 2018-12-31 ENCOUNTER — Telehealth: Payer: Self-pay | Admitting: *Deleted

## 2018-12-31 ENCOUNTER — Telehealth: Payer: Self-pay | Admitting: Obstetrics & Gynecology

## 2018-12-31 NOTE — Telephone Encounter (Signed)
Called patient and informed her that we are not allowing any visitors or children to accompany her to her visit. Patient denies fever, cough, SOB, muscle pain, diarrhea, rash, vomiting, abdominal pain, red eye, weakness, bruising or bleeding, joint pain or a severe headache.  

## 2018-12-31 NOTE — Telephone Encounter (Signed)
Called to reschedule appointment.

## 2019-01-01 ENCOUNTER — Telehealth: Payer: Self-pay | Admitting: *Deleted

## 2019-01-01 ENCOUNTER — Ambulatory Visit: Payer: Self-pay | Admitting: Obstetrics & Gynecology

## 2019-01-01 NOTE — Telephone Encounter (Signed)
I guess we will when we see her

## 2019-01-01 NOTE — Telephone Encounter (Signed)
Patient canceled her appointment with Dr Despina Hidden today due to not having anyone to watch her child, patient rescheduled to Tuesday. Patient wanted to know if Dr Despina Hidden can put her in a lab order to have her labs and thyroid checked. Please advise [phone]

## 2019-01-05 ENCOUNTER — Other Ambulatory Visit: Payer: Self-pay | Admitting: Obstetrics and Gynecology

## 2019-01-05 ENCOUNTER — Telehealth: Payer: Self-pay | Admitting: *Deleted

## 2019-01-05 NOTE — Telephone Encounter (Signed)
Patient informed that we are not allowing visitors or children to come to appointments at this time. Patient denies any contact with anyone suspected or confirmed of having COVID-19. Pt denies fever, cough, sob, muscle pain, diarrhea, rash, vomiting, abdominal pain, red eye, weakness, bruising or bleeding, joint pain or severe headache.  

## 2019-01-06 ENCOUNTER — Encounter: Payer: Self-pay | Admitting: Obstetrics & Gynecology

## 2019-01-06 ENCOUNTER — Other Ambulatory Visit: Payer: Self-pay

## 2019-01-06 ENCOUNTER — Ambulatory Visit (INDEPENDENT_AMBULATORY_CARE_PROVIDER_SITE_OTHER): Payer: Medicaid Other | Admitting: Obstetrics & Gynecology

## 2019-01-06 DIAGNOSIS — E039 Hypothyroidism, unspecified: Secondary | ICD-10-CM | POA: Diagnosis not present

## 2019-01-06 DIAGNOSIS — Z1389 Encounter for screening for other disorder: Secondary | ICD-10-CM | POA: Diagnosis not present

## 2019-01-06 NOTE — Progress Notes (Signed)
.   Subjective:     Miranda [name] is a 38 y.o. female who presents for a postpartum visit. She is 5 weeks postpartum following a low cervical transverse Cesarean section. I have fully reviewed the prenatal and intrapartum course. The delivery was at 39  gestational weeks. Outcome: primary cesarean section, low transverse incision. Anesthesia: spinal. Postpartum course has been increased anxiety. Baby's course has been normal. Baby is feeding by bottle. Bleeding no bleeding. Bowel function is normal. Bladder function is normal. Patient is not sexually active. Contraception method is tubal ligation. Postpartum depression screening: negative.  The following portions of the patient's history were reviewed and updated as appropriate: allergies, current medications, past family history, past medical history, past social history, past surgical history and problem list.  Review of Systems Pertinent items are noted in HPI.   Objective:    BP 131/86 (BP Location: Left Arm, Patient Position: Sitting, Cuff Size: Normal)   Pulse 79   Ht 5\' 8"  (1.727 m)   Wt 205 lb (93 kg)   LMP  (LMP Unknown)   Breastfeeding No   BMI 31.17 kg/m   General:  alert, cooperative and no distress   Breasts:  inspection negative, no nipple discharge or bleeding, no masses or nodularity palpable  Lungs:   Heart:    Abdomen: soft, non-tender; bowel sounds normal; no masses,  no organomegaly   Vulva:  normal  Vagina: normal vagina  Cervix:  no cervical motion tenderness and no lesions  Corpus: normal size, contour, position, consistency, mobility, non-tender  Adnexa:  normal adnexa  Rectal Exam:         Assessment:     normal postpartum exam. Pap smear not done at today's visit.   Plan:    1. Contraception: tubal ligation 2. Increase zoloft to 50 mg daily 3. Follow up in: 4 weeks or as needed. televisit to check on zoloft response

## 2019-01-07 LAB — TSH: TSH: 0.144 u[IU]/mL — ABNORMAL LOW (ref 0.450–4.500)

## 2019-01-08 ENCOUNTER — Other Ambulatory Visit: Payer: Self-pay | Admitting: Obstetrics & Gynecology

## 2019-01-08 MED ORDER — LEVOTHYROXINE SODIUM 125 MCG PO TABS: 125.0000 ug | ORAL_TABLET | Freq: Every day | ORAL | 3 refills | Status: DC

## 2019-01-22 NOTE — Telephone Encounter (Signed)
refil acyclovir x 60 tabs x 4 months

## 2019-01-26 ENCOUNTER — Telehealth: Payer: Self-pay | Admitting: Obstetrics & Gynecology

## 2019-01-26 NOTE — Telephone Encounter (Signed)
Pt wants Zoloft 50 mg sent to pharmacy. Not totally out yet.

## 2019-01-26 NOTE — Telephone Encounter (Signed)
Pt states that she has been taking 2 of the sertraline (ZOLOFT) 25 MG tablet and is running out of medication and is wanting to see if she can get a refill.

## 2019-01-27 ENCOUNTER — Other Ambulatory Visit: Payer: Self-pay | Admitting: Obstetrics & Gynecology

## 2019-01-27 MED ORDER — SERTRALINE HCL 50 MG PO TABS: 50.0000 mg | ORAL_TABLET | Freq: Every day | ORAL | 4 refills | Status: DC

## 2019-01-27 NOTE — Telephone Encounter (Signed)
done

## 2019-02-03 ENCOUNTER — Other Ambulatory Visit: Payer: Self-pay

## 2019-02-03 ENCOUNTER — Encounter: Payer: Self-pay | Admitting: Obstetrics & Gynecology

## 2019-02-03 ENCOUNTER — Ambulatory Visit (INDEPENDENT_AMBULATORY_CARE_PROVIDER_SITE_OTHER): Payer: Medicaid Other | Admitting: Obstetrics & Gynecology

## 2019-02-03 VITALS — BP 132/86 | HR 74 | Ht 68.0 in | Wt 206.0 lb

## 2019-02-03 DIAGNOSIS — O99345 Other mental disorders complicating the puerperium: Secondary | ICD-10-CM

## 2019-02-03 DIAGNOSIS — L02229 Furuncle of trunk, unspecified: Secondary | ICD-10-CM | POA: Diagnosis not present

## 2019-02-03 DIAGNOSIS — F53 Postpartum depression: Secondary | ICD-10-CM

## 2019-02-03 MED ORDER — SULFAMETHOXAZOLE-TRIMETHOPRIM 800-160 MG PO TABS: 1.0000 | ORAL_TABLET | Freq: Two times a day (BID) | ORAL | 1 refills | Status: DC

## 2019-02-03 MED ORDER — SERTRALINE HCL 100 MG PO TABS: 100.0000 mg | ORAL_TABLET | Freq: Every day | ORAL | 5 refills | Status: DC

## 2019-02-03 NOTE — Progress Notes (Signed)
Chief Complaint  Patient presents with  . Follow-up      38 y.o. [id number] Patient's last menstrual period was 01/24/2019 (exact date). The current method of family planning is tubal ligation.  Outpatient Encounter Medications as of 02/03/2019  Medication Sig  . acyclovir (ZOVIRAX) 400 MG tablet TAKE 1 TABLET BY MOUTH TWICE DAILY  . levothyroxine (SYNTHROID, LEVOTHROID) 125 MCG tablet Take 1 tablet (125 mcg total) by mouth daily before breakfast.  . metFORMIN (GLUCOPHAGE) 1000 MG tablet TAKE 1 TABLET(1000 MG) BY MOUTH TWICE DAILY WITH A MEAL (Patient taking differently: Take 1,000 mg by mouth daily with breakfast. )  . [DISCONTINUED] sertraline (ZOLOFT) 50 MG tablet Take 1 tablet (50 mg total) by mouth daily.  . sertraline (ZOLOFT) 100 MG tablet Take 1 tablet (100 mg total) by mouth daily.  Marland Kitchen sulfamethoxazole-trimethoprim (BACTRIM DS) 800-160 MG tablet Take 1 tablet by mouth 2 (two) times daily.  . [DISCONTINUED] sertraline (ZOLOFT) 25 MG tablet Take 1 tablet (25 mg total) by mouth daily. (Patient not taking: Reported on 02/03/2019)   No facility-administered encounter medications on file as of 02/03/2019.     Subjective Pt originally was supposed to have encounter to check on her postpartum depression which is significantly improved Still some anxiety issues, chest heaviness, on edge, no suicidal thoughts, or thoughts of harming the baby  Does have a new problem She has another abdominal wall boil, differetn location, nearby, to the previous one Been there a week, tender, red, warm, similar to previous but new Past Medical History:  Diagnosis Date  . Chest pain 07/24/2012   2D Echo EF 60%-65% which shoewd a bicuspid aortic vavle with mild stenosis  . Diabetes mellitus    type 2 on insulin  . Heart valve problem   . Herpes   . Hypothyroidism   . Irregular periods 04/14/2014  . Mental disorder    depression, postpartum depression  . Palpitation   . PVC (premature  ventricular contraction) 07/24/2012   stress test which was normal with good excercise    Past Surgical History:  Procedure Laterality Date  . CESAREAN SECTION WITH BILATERAL TUBAL LIGATION N/A 11/19/2018   Procedure: CESAREAN SECTION WITH BILATERAL TUBAL LIGATION;  Surgeon: Lazaro Arms, MD;  Location: [facility] BIRTHING SUITES;  Service: Obstetrics;  Laterality: N/A;  . INCISION AND DRAINAGE OF PERITONSILLAR ABCESS N/A 11/19/2018   Procedure: EXCISION OF ABDOMINAL WALL ABCESS;  Surgeon: Lazaro Arms, MD;  Location: [facility] BIRTHING SUITES;  Service: Obstetrics;  Laterality: N/A;  . WISDOM TOOTH EXTRACTION      OB History    Gravida  2   Para  2   Term  2   Preterm      AB      Living  2     SAB      TAB      Ectopic      Multiple  0   Live Births  2           Allergies  Allergen Reactions  . Keflex [Cephalexin] Nausea And Vomiting    Social History   Socioeconomic History  . Marital status: Divorced    Spouse name: Not on file  . Number of children: 2  . Years of education: Not on file  . Highest education level: Not on file  Occupational History  . Not on file  Social Needs  . Financial resource strain: Not on file  . Food insecurity:  Worry: Not on file    Inability: Not on file  . Transportation needs:    Medical: Not on file    Non-medical: Not on file  Tobacco Use  . Smoking status: Current Every Day Smoker    Packs/day: 0.25    Years: 5.00    Pack years: 1.25    Types: Cigarettes    Last attempt to quit: 05/20/2012    Years since quitting: 6.7  . Smokeless tobacco: Never Used  . Tobacco comment: 5 cigs per day  Substance and Sexual Activity  . Alcohol use: No    Alcohol/week: 0.0 standard drinks  . Drug use: No  . Sexual activity: Not Currently    Partners: Male    Birth control/protection: None  Lifestyle  . Physical activity:    Days per week: Not on file    Minutes per session: Not on file  . Stress: Not on file  Relationships   . Social connections:    Talks on phone: Not on file    Gets together: Not on file    Attends religious service: Not on file    Active member of club or organization: Not on file    Attends meetings of clubs or organizations: Not on file    Relationship status: Not on file  Other Topics Concern  . Not on file  Social History Narrative  . Not on file    Family History  Problem Relation Age of Onset  . Hypertension Mother   . Diabetes Brother   . Diabetes Maternal Grandmother   . Congestive Heart Failure Maternal Grandmother   . Hearing loss Daughter     Medications:       Current Outpatient Medications:  .  acyclovir (ZOVIRAX) 400 MG tablet, TAKE 1 TABLET BY MOUTH TWICE DAILY, Disp: 60 tablet, Rfl: 4 .  levothyroxine (SYNTHROID, LEVOTHROID) 125 MCG tablet, Take 1 tablet (125 mcg total) by mouth daily before breakfast., Disp: 30 tablet, Rfl: 3 .  metFORMIN (GLUCOPHAGE) 1000 MG tablet, TAKE 1 TABLET(1000 MG) BY MOUTH TWICE DAILY WITH A MEAL (Patient taking differently: Take 1,000 mg by mouth daily with breakfast. ), Disp: 60 tablet, Rfl: 5 .  sertraline (ZOLOFT) 100 MG tablet, Take 1 tablet (100 mg total) by mouth daily., Disp: 30 tablet, Rfl: 5 .  sulfamethoxazole-trimethoprim (BACTRIM DS) 800-160 MG tablet, Take 1 tablet by mouth 2 (two) times daily., Disp: 20 tablet, Rfl: 1  Objective Blood pressure 132/86, pulse 74, height 5\' 8"  (1.727 m), weight 206 lb (93.4 kg), last menstrual period 01/24/2019, not currently breastfeeding.  Gen WDWN NAD Abdomen 1 cm boil on abdomen above incision of c section and previous boil  Pertinent ROS No burning with urination, frequency or urgency No nausea, vomiting or diarrhea Nor fever chills or other constitutional symptoms   Labs or studies No new    Impression Diagnoses this Encounter::   ICD-10-CM   1. Boil of skin of abdominal wall L02.229   2. Postpartum depression, postpartum condition O99.345    F53.0     Established  relevant diagnosis(es):   Plan/Recommendations: Meds ordered this encounter  Medications  . sulfamethoxazole-trimethoprim (BACTRIM DS) 800-160 MG tablet    Sig: Take 1 tablet by mouth 2 (two) times daily.    Dispense:  20 tablet    Refill:  1  . sertraline (ZOLOFT) 100 MG tablet    Sig: Take 1 tablet (100 mg total) by mouth daily.    Dispense:  30 tablet  Refill:  5    Labs or Scans Ordered: No orders of the defined types were placed in this encounter.   Management:: >bactrim DS and topical boil ease for her abdominal wall boil, may need to be removed if does not respond, this is recurrent, different location but maybe recurrent, also warm wet soaks 2-3 times daily >increase zoloft to 100 mg to see if helps her anxiety component as well  Follow up Return in about 2 weeks (around 02/17/2019) for webex visit or televisit , Follow up, with Dr Despina Hidden.        All questions were answered.

## 2019-02-11 ENCOUNTER — Telehealth: Payer: Self-pay | Admitting: Obstetrics & Gynecology

## 2019-02-11 NOTE — Telephone Encounter (Signed)
Left message @ 3:39 pm. JSY

## 2019-02-11 NOTE — Telephone Encounter (Signed)
Pt requesting a call from a nurse. States her stomach has been hurting her for a few weeks now and she would like an appt.

## 2019-02-11 NOTE — Telephone Encounter (Addendum)
Pt having bad stomach pains x 2 weeks in her lower abdomen near her c/s incision.  No problems with urine or BM's and no fever. Stomach feels swollen. States it "just hurts".  Last period was 4/17.  She asked if she needed an antibiotic and I informed her that since she was not running a fever and incision was healed, she should not and it may just be scar tissue development.  Please advise.

## 2019-02-12 NOTE — Telephone Encounter (Signed)
Agreed could also be having some GI upset from the antibiotic as well

## 2019-02-16 ENCOUNTER — Encounter: Payer: Self-pay | Admitting: *Deleted

## 2019-02-17 ENCOUNTER — Encounter: Payer: Self-pay | Admitting: Obstetrics & Gynecology

## 2019-02-17 ENCOUNTER — Ambulatory Visit (INDEPENDENT_AMBULATORY_CARE_PROVIDER_SITE_OTHER): Payer: Medicaid Other | Admitting: Obstetrics & Gynecology

## 2019-02-17 ENCOUNTER — Other Ambulatory Visit: Payer: Self-pay

## 2019-02-17 DIAGNOSIS — F53 Postpartum depression: Secondary | ICD-10-CM

## 2019-02-17 DIAGNOSIS — L02229 Furuncle of trunk, unspecified: Secondary | ICD-10-CM

## 2019-02-17 DIAGNOSIS — O99345 Other mental disorders complicating the puerperium: Secondary | ICD-10-CM

## 2019-02-17 NOTE — Progress Notes (Signed)
TELEHEALTH VIRTUAL GYNECOLOGY VISIT ENCOUNTER NOTE  I connected with [name] on 02/17/19 at  9:15 AM EDT by telephone at home and verified that I am speaking with the correct person using two identifiers.   I discussed the limitations, risks, security and privacy concerns of performing an evaluation and management service by telephone and the availability of in person appointments. I also discussed with the patient that there may be a patient responsible charge related to this service. The patient expressed understanding and agreed to proceed.   History:  Miranda [name] is a 38 y.o. [id number] female being evaluated today for postpartum depression and an abdominal wall boil. She denies any abnormal vaginal discharge, bleeding, pelvic pain or other concerns.     Pt states her depression symptoms have markedly improved with the zoloft 100 mg, still with some anxiety issues but improved No suicidal thoughts or thoughts of harming the baby  Additionally her boil on her abdomen is improved still there but not red or tender, good response to the antibiotics Recommend continuing her topical silvadene and local care regimen   Past Medical History:  Diagnosis Date  . Chest pain 07/24/2012   2D Echo EF 60%-65% which shoewd a bicuspid aortic vavle with mild stenosis  . Diabetes mellitus    type 2 on insulin  . Heart valve problem   . Herpes   . Hypothyroidism   . Irregular periods 04/14/2014  . Mental disorder    depression, postpartum depression  . Palpitation   . PVC (premature ventricular contraction) 07/24/2012   stress test which was normal with good excercise   Past Surgical History:  Procedure Laterality Date  . CESAREAN SECTION WITH BILATERAL TUBAL LIGATION N/A 11/19/2018   Procedure: CESAREAN SECTION WITH BILATERAL TUBAL LIGATION;  Surgeon: Lazaro Arms, MD;  Location: [facility] BIRTHING SUITES;  Service: Obstetrics;  Laterality: N/A;  . INCISION AND DRAINAGE OF PERITONSILLAR ABCESS  N/A 11/19/2018   Procedure: EXCISION OF ABDOMINAL WALL ABCESS;  Surgeon: Lazaro Arms, MD;  Location: [facility] BIRTHING SUITES;  Service: Obstetrics;  Laterality: N/A;  . WISDOM TOOTH EXTRACTION     The following portions of the patient's history were reviewed and updated as appropriate: allergies, current medications, past family history, past medical history, past social history, past surgical history and problem list.   Health Maintenance:  Normal pap and negative HRHPV on n/a.  Normal mammogram on n/a.   Review of Systems:  Pertinent items noted in HPI and remainder of comprehensive ROS otherwise negative.  Physical Exam:  Physical exam deferred due to nature of the encounter  Labs and Imaging No results found for this or any previous visit (from the past 336 hour(s)). No results found.     No orders of the defined types were placed in this encounter.   No orders of the defined types were placed in this encounter.   Assessment and Plan:     Postpartum depression, postpartum condition - improved, continue at the present dosing, call if develops any suicidal thoughts or worsening state  Boil of skin of abdominal wall - improved, continue with topical silvadene and local care        I discussed the assessment and treatment plan with the patient. The patient was provided an opportunity to ask questions and all were answered. The patient agreed with the plan and demonstrated an understanding of the instructions.   The patient was advised to call back or seek an in-person evaluation/go to  the ED if the symptoms worsen or if the condition fails to improve as anticipated.  I provided 12 minutes of non-face-to-face time during this encounter.   Lazaro ArmsLuther H Masha Orbach, MD [facility]

## 2019-02-23 ENCOUNTER — Other Ambulatory Visit: Payer: Self-pay | Admitting: Obstetrics and Gynecology

## 2019-03-31 ENCOUNTER — Ambulatory Visit: Payer: Medicaid Other | Admitting: Obstetrics & Gynecology

## 2019-04-06 ENCOUNTER — Encounter: Payer: Self-pay | Admitting: *Deleted

## 2019-04-07 ENCOUNTER — Ambulatory Visit: Payer: Medicaid Other | Admitting: Obstetrics & Gynecology

## 2019-05-04 ENCOUNTER — Telehealth: Payer: Self-pay | Admitting: Obstetrics & Gynecology

## 2019-05-04 NOTE — Telephone Encounter (Signed)
Would like for Dr Elonda Husky to give her a call .

## 2019-05-12 ENCOUNTER — Telehealth: Payer: Self-pay | Admitting: Obstetrics & Gynecology

## 2019-05-12 ENCOUNTER — Other Ambulatory Visit: Payer: Self-pay | Admitting: Obstetrics & Gynecology

## 2019-05-12 MED ORDER — METRONIDAZOLE 0.75 % VA GEL: VAGINAL | 0 refills | Status: DC

## 2019-05-12 MED ORDER — DOXYCYCLINE HYCLATE 100 MG PO TABS: 100.0000 mg | ORAL_TABLET | Freq: Two times a day (BID) | ORAL | 0 refills | Status: DC

## 2019-05-20 ENCOUNTER — Encounter (HOSPITAL_COMMUNITY): Payer: Self-pay | Admitting: Emergency Medicine

## 2019-05-20 ENCOUNTER — Ambulatory Visit (HOSPITAL_COMMUNITY)
Admission: EM | Admit: 2019-05-20 | Discharge: 2019-05-20 | Disposition: A | Discharge status: Home / Self Care | Payer: Medicaid Other | Attending: Family Medicine | Admitting: Family Medicine

## 2019-05-20 ENCOUNTER — Other Ambulatory Visit: Payer: Self-pay

## 2019-05-20 DIAGNOSIS — Z3202 Encounter for pregnancy test, result negative: Secondary | ICD-10-CM | POA: Diagnosis not present

## 2019-05-20 DIAGNOSIS — R1030 Lower abdominal pain, unspecified: Secondary | ICD-10-CM | POA: Insufficient documentation

## 2019-05-20 DIAGNOSIS — S46812A Strain of other muscles, fascia and tendons at shoulder and upper arm level, left arm, initial encounter: Secondary | ICD-10-CM | POA: Insufficient documentation

## 2019-05-20 LAB — POCT URINALYSIS DIP (DEVICE)
Bilirubin Urine: NEGATIVE
Glucose, UA: NEGATIVE mg/dL
Hgb urine dipstick: NEGATIVE
Ketones, ur: NEGATIVE mg/dL
Leukocytes,Ua: NEGATIVE
Nitrite: NEGATIVE
Protein, ur: NEGATIVE mg/dL
Specific Gravity, Urine: >=1.03 (ref 1.005–1.030)
Urobilinogen, UA: 0.2 mg/dL (ref 0.0–1.0)
pH: 5.5 (ref 5.0–8.0)

## 2019-05-20 LAB — POCT PREGNANCY, URINE: Preg Test, Ur: NEGATIVE

## 2019-05-20 MED ORDER — NAPROXEN 500 MG PO TABS: 500.0000 mg | ORAL_TABLET | Freq: Two times a day (BID) | ORAL | 0 refills | Status: DC

## 2019-05-20 MED ORDER — CYCLOBENZAPRINE HCL 5 MG PO TABS: 5.0000 mg | ORAL_TABLET | Freq: Two times a day (BID) | ORAL | 0 refills | Status: DC | PRN

## 2019-05-20 NOTE — ED Triage Notes (Signed)
Pt sts lower abd pain and back pain; pt unsure if she could have tampon stuck

## 2019-05-20 NOTE — Discharge Instructions (Signed)
Please use naprosyn twice daily for back/neck/abdominal pain You may use flexeril as needed to help with your neck pain. This is a muscle relaxer and causes sedation- please use only at bedtime or when you will be home and not have to drive/work Warm compresses to abdomen  Urine was normal- no sign of infection Swab pending.  If swab returns normal and still having pain, please follow up with OBGYN If pain worsening, follow up here or emergency room

## 2019-05-21 NOTE — ED Provider Notes (Signed)
[place]    CSN: [id number] Arrival date & time: 05/20/19  1823      History   Chief Complaint Chief Complaint  Patient presents with  . Abdominal Pain  . Back Pain    HPI Miranda [name] is a 38 y.o. female history of DM type II, hypothyroidism, tobacco use, presenting today for evaluation of abdominal pain and back pain.  Patient states that she has had lower back pain for a while.  Over the past week she has developed lower abdominal pain as well.  She notes that she also has pain in her upper back near her neck.  Pain in upper back is triggered by movement of her neck.  Denies any injury, increase in activity fall or heavy lifting.  Denies numbness or tingling.  Denies radiation.  Denies any urinary symptoms of dysuria, increased frequency or urgency.  Denies hematuria.  Denies incontinence.  Denies saddle anesthesia.  Abdominal pain feels as if she is bloated.  She denies associated nausea or vomiting.  Has maintained normal oral intake without worsening of discomfort.  Has had normal daily bowel movements without straining.  Denies relief of discomfort with passing bowels.  Denies history of cysts or fibroids.  Is having regular cycles.  After research on the Internet she is concerned about possible retained tampon.  She has been taking Tylenol and ibuprofen without resolution of symptoms.  Denies concerns for STDs, but would like to have this evaluated.  HPI  Past Medical History:  Diagnosis Date  . Chest pain 07/24/2012   2D Echo EF 60%-65% which shoewd a bicuspid aortic vavle with mild stenosis  . Diabetes mellitus    type 2 on insulin  . Heart valve problem   . Herpes   . Hypothyroidism   . Irregular periods 04/14/2014  . Mental disorder    depression, postpartum depression  . Palpitation   . PVC (premature ventricular contraction) 07/24/2012   stress test which was normal with good excercise    Patient Active Problem List   Diagnosis Date Noted  .  Cesarean delivery delivered 11/19/2018  . UTI (urinary tract infection) during pregnancy, first trimester 04/28/2018  . Smoker 10/21/2013  . Depression 10/21/2013  . Palpitations 03/03/2013  . Type II diabetes mellitus ([place]) 02/04/2013  . Hypothyroidism 01/19/2013    Past Surgical History:  Procedure Laterality Date  . CESAREAN SECTION WITH BILATERAL TUBAL LIGATION N/A 11/19/2018   Procedure: CESAREAN SECTION WITH BILATERAL TUBAL LIGATION;  Surgeon: Florian Buff, MD;  Location: [place];  Service: Obstetrics;  Laterality: N/A;  . INCISION AND DRAINAGE OF PERITONSILLAR ABCESS N/A 11/19/2018   Procedure: EXCISION OF ABDOMINAL WALL ABCESS;  Surgeon: Florian Buff, MD;  Location: [facility];  Service: Obstetrics;  Laterality: N/A;  . WISDOM TOOTH EXTRACTION      OB History    Gravida  2   Para  2   Term  2   Preterm      AB      Living  2     SAB      TAB      Ectopic      Multiple  0   Live Births  2            Home Medications    Prior to Admission medications   Medication Sig Start Date End Date Taking? Authorizing Provider  acyclovir (ZOVIRAX) 400 MG tablet TAKE 1 TABLET BY MOUTH TWICE DAILY 02/23/19  Tilda BurrowFerguson, John V, MD  cyclobenzaprine (FLEXERIL) 5 MG tablet Take 1-2 tablets (5-10 mg total) by mouth 2 (two) times daily as needed for muscle spasms. 05/20/19   Tawfiq Favila C, PA-C  doxycycline (VIBRA-TABS) 100 MG tablet Take 1 tablet (100 mg total) by mouth 2 (two) times daily. 05/12/19   Lazaro ArmsEure, Luther H, MD  levothyroxine (SYNTHROID) 125 MCG tablet TAKE 1 TABLET(125 MCG) BY MOUTH DAILY BEFORE BREAKFAST 05/12/19   Lazaro ArmsEure, Luther H, MD  metFORMIN (GLUCOPHAGE) 1000 MG tablet TAKE 1 TABLET(1000 MG) BY MOUTH TWICE DAILY WITH A MEAL Patient taking differently: Take 1,000 mg by mouth daily with breakfast.  11/10/18   Lazaro ArmsEure, Luther H, MD  metroNIDAZOLE (METROGEL VAGINAL) 0.75 % vaginal gel Nightly x 5 nights Patient not taking: Reported on 05/20/2019  05/12/19   Lazaro ArmsEure, Luther H, MD  naproxen (NAPROSYN) 500 MG tablet Take 1 tablet (500 mg total) by mouth 2 (two) times daily. 05/20/19   Tyronica Truxillo C, PA-C  sertraline (ZOLOFT) 100 MG tablet Take 1 tablet (100 mg total) by mouth daily. 02/03/19   Lazaro ArmsEure, Luther H, MD    Family History Family History  Problem Relation Age of Onset  . Hypertension Mother   . Diabetes Brother   . Diabetes Maternal Grandmother   . Congestive Heart Failure Maternal Grandmother   . Hearing loss Daughter     Social History Social History   Tobacco Use  . Smoking status: Current Every Day Smoker    Packs/day: 0.25    Years: 5.00    Pack years: 1.25    Types: Cigarettes    Last attempt to quit: 05/20/2012    Years since quitting: 7.0  . Smokeless tobacco: Never Used  . Tobacco comment: 5 cigs per day  Substance Use Topics  . Alcohol use: No    Alcohol/week: 0.0 standard drinks  . Drug use: No     Allergies   Keflex [cephalexin]   Review of Systems Review of Systems  Constitutional: Negative for fever.  Respiratory: Negative for shortness of breath.   Cardiovascular: Negative for chest pain.  Gastrointestinal: Positive for abdominal pain. Negative for diarrhea, nausea and vomiting.  Genitourinary: Negative for dysuria, flank pain, genital sores, hematuria, menstrual problem, vaginal bleeding, vaginal discharge and vaginal pain.  Musculoskeletal: Negative for back pain.  Skin: Negative for rash.  Neurological: Negative for dizziness, light-headedness and headaches.     Physical Exam Triage Vital Signs ED Triage Vitals  Enc Vitals Group     BP 05/20/19 1835 (!) 156/82     Pulse Rate 05/20/19 1835 80     Resp 05/20/19 1835 18     Temp 05/20/19 1835 98.2 F (36.8 C)     Temp Source 05/20/19 1835 Oral     SpO2 05/20/19 1835 98 %     Weight --      Height --      Head Circumference --      Peak Flow --      Pain Score 05/20/19 1836 7     Pain Loc --      Pain Edu? --      Excl. in  GC? --    No data found.  Updated Vital Signs BP (!) 156/82 (BP Location: Right Arm)   Pulse 80   Temp 98.2 F (36.8 C) (Oral)   Resp 18   SpO2 98%   Visual Acuity Right Eye Distance:   Left Eye Distance:   Bilateral Distance:  Right Eye Near:   Left Eye Near:    Bilateral Near:     Physical Exam Vitals signs and nursing note reviewed.  Constitutional:      General: She is not in acute distress.    Appearance: She is well-developed.  HENT:     Head: Normocephalic and atraumatic.  Eyes:     Conjunctiva/sclera: Conjunctivae normal.  Neck:     Musculoskeletal: Neck supple.  Cardiovascular:     Rate and Rhythm: Normal rate and regular rhythm.     Heart sounds: No murmur.  Pulmonary:     Effort: Pulmonary effort is normal. No respiratory distress.     Breath sounds: Normal breath sounds.  Abdominal:     Palpations: Abdomen is soft.     Tenderness: There is no abdominal tenderness.     Comments: Abdomen soft, nondistended, patient changes position easily.  Bilateral lower quadrants tender to palpation, no focal tenderness, negative rebound, negative Rovsing, negative McBurney's.  Nontender to bilateral upper quadrants or epigastrium.  Genitourinary:    Comments: Normal external female genitalia, no rashes or lesions noted externally, vaginal mucosa pink, cervix pink without erythema, scant amount of clearish yellowish discharge seen in os; no foreign body Musculoskeletal:     Comments: Nontender to palpation of cervical, thoracic and lumbar spine midline, reproducible tenderness in left trapezius musculature, full active range of motion of neck, pain elicited with leftward rotation  Nontender to palpation of bilateral lumbar musculature  Skin:    General: Skin is warm and dry.  Neurological:     Mental Status: She is alert.      UC Treatments / Results  Labs (all labs ordered are listed, but only abnormal results are displayed) Labs Reviewed  POCT URINALYSIS  DIP (DEVICE)  POCT PREGNANCY, URINE  CERVICOVAGINAL ANCILLARY ONLY    EKG   Radiology No results found.  Procedures Procedures (including critical care time)  Medications Ordered in UC Medications - No data to display  Initial Impression / Assessment and Plan / UC Course  I have reviewed the triage vital signs and the nursing notes.  Pertinent labs & imaging results that were available during my care of the patient were reviewed by me and considered in my medical decision making (see chart for details).     Patient with lower abdominal pain, seems most likely pelvic etiology given normal oral intake without nausea vomiting or changes in bowels.  Pain does not alter with oral intake.  Vaginal swab obtained to check for STDs as well as BV.  No foreign body observed to her as cause of discomfort.  Will treat with Naprosyn temporarily.  Follow-up with OB/GYN if symptoms persisting.  Patient also has trapezius tenderness, will also treat with Naprosyn and Flexeril.  No midline tenderness.  Discussed strict return precautions. Patient verbalized understanding and is agreeable with plan.  Final Clinical Impressions(s) / UC Diagnoses   Final diagnoses:  Lower abdominal pain  Strain of left trapezius muscle, initial encounter     Discharge Instructions     Please use naprosyn twice daily for back/neck/abdominal pain You may use flexeril as needed to help with your neck pain. This is a muscle relaxer and causes sedation- please use only at bedtime or when you will be home and not have to drive/work Warm compresses to abdomen  Urine was normal- no sign of infection Swab pending.  If swab returns normal and still having pain, please follow up with OBGYN If pain worsening, follow  up here or emergency room     ED Prescriptions    Medication Sig Dispense Auth. Provider   naproxen (NAPROSYN) 500 MG tablet Take 1 tablet (500 mg total) by mouth 2 (two) times daily. 30 tablet  Geneen Dieter C, PA-C   cyclobenzaprine (FLEXERIL) 5 MG tablet Take 1-2 tablets (5-10 mg total) by mouth 2 (two) times daily as needed for muscle spasms. 24 tablet Cicley Ganesh, PhoenixHallie C, PA-C     Controlled Substance Prescriptions [place] Controlled Substance Registry consulted? Not Applicable   Lew DawesWieters, Dakoda Bassette C, [place] JerseyPA-C 05/21/19 [id number]

## 2019-05-22 ENCOUNTER — Telehealth (HOSPITAL_COMMUNITY): Payer: Self-pay | Admitting: Emergency Medicine

## 2019-05-22 LAB — CERVICOVAGINAL ANCILLARY ONLY
Bacterial vaginitis: NEGATIVE
Candida vaginitis: NEGATIVE
Chlamydia: NEGATIVE
Neisseria Gonorrhea: NEGATIVE
Trichomonas: POSITIVE — AB

## 2019-05-22 MED ORDER — METRONIDAZOLE 500 MG PO TABS: 2000.0000 mg | ORAL_TABLET | Freq: Once | ORAL | 0 refills | Status: AC

## 2019-05-22 NOTE — Telephone Encounter (Signed)
Trichomonas is positive. Rx  for Flagyl 2 grams, once was sent to the pharmacy of record. Pt needs education to refrain from sexual intercourse for 7 days to give the medicine time to work. Sexual partners need to be notified and tested/treated. Condoms may reduce risk of reinfection. Recheck for further evaluation if symptoms are not improving.   Patient contacted and made aware of    results, all questions answered    

## 2019-06-24 ENCOUNTER — Other Ambulatory Visit: Payer: Self-pay | Admitting: Obstetrics & Gynecology

## 2019-06-25 ENCOUNTER — Encounter: Payer: Self-pay | Admitting: *Deleted

## 2019-06-26 ENCOUNTER — Other Ambulatory Visit: Payer: Self-pay | Admitting: *Deleted

## 2019-06-29 MED ORDER — DOXYCYCLINE HYCLATE 100 MG PO TABS: 100.0000 mg | ORAL_TABLET | Freq: Two times a day (BID) | ORAL | 0 refills | Status: DC

## 2019-08-06 ENCOUNTER — Other Ambulatory Visit: Payer: Self-pay | Admitting: Obstetrics and Gynecology

## 2019-08-13 ENCOUNTER — Encounter: Payer: Self-pay | Admitting: *Deleted

## 2019-08-13 ENCOUNTER — Other Ambulatory Visit: Payer: Self-pay

## 2019-08-14 ENCOUNTER — Other Ambulatory Visit: Payer: Self-pay | Admitting: Obstetrics & Gynecology

## 2019-08-14 MED ORDER — DOXYCYCLINE HYCLATE 100 MG PO TABS: 100.0000 mg | ORAL_TABLET | Freq: Two times a day (BID) | ORAL | 0 refills | Status: DC

## 2019-08-24 DIAGNOSIS — R0602 Shortness of breath: Secondary | ICD-10-CM | POA: Diagnosis not present

## 2019-08-24 DIAGNOSIS — J069 Acute upper respiratory infection, unspecified: Secondary | ICD-10-CM | POA: Diagnosis not present

## 2019-08-24 DIAGNOSIS — Z20828 Contact with and (suspected) exposure to other viral communicable diseases: Secondary | ICD-10-CM | POA: Diagnosis not present

## 2019-08-24 DIAGNOSIS — Z888 Allergy status to other drugs, medicaments and biological substances status: Secondary | ICD-10-CM | POA: Diagnosis not present

## 2019-09-25 ENCOUNTER — Other Ambulatory Visit: Payer: Self-pay | Admitting: Obstetrics & Gynecology

## 2019-10-24 ENCOUNTER — Other Ambulatory Visit: Payer: Self-pay | Admitting: Obstetrics & Gynecology

## 2019-11-06 ENCOUNTER — Telehealth: Payer: Self-pay | Admitting: *Deleted

## 2019-11-06 NOTE — Telephone Encounter (Signed)
Telephoned patient at home number and patient stated feels uncomfortable when urinating. Patient states is taking cranberry pills. Advised patient would get in for nurse visit. Tried to transfer patient and got disconnected. Tried calling patient back and got voicemail.

## 2019-11-06 NOTE — Telephone Encounter (Signed)
Pt left message that she would like Dr. Despina Hidden to send in a medication for a uti.

## 2019-11-09 ENCOUNTER — Telehealth: Payer: Self-pay | Admitting: Obstetrics & Gynecology

## 2019-11-09 MED ORDER — SULFAMETHOXAZOLE-TRIMETHOPRIM 800-160 MG PO TABS: 1.0000 | ORAL_TABLET | Freq: Two times a day (BID) | ORAL | 0 refills | Status: DC

## 2020-03-02 ENCOUNTER — Other Ambulatory Visit: Payer: Self-pay | Admitting: Obstetrics & Gynecology

## 2020-03-09 ENCOUNTER — Other Ambulatory Visit: Payer: Self-pay | Admitting: Obstetrics & Gynecology

## 2020-04-12 ENCOUNTER — Other Ambulatory Visit: Payer: Self-pay | Admitting: Obstetrics and Gynecology

## 2020-06-20 DIAGNOSIS — R079 Chest pain, unspecified: Secondary | ICD-10-CM | POA: Diagnosis not present

## 2020-06-20 DIAGNOSIS — M7542 Impingement syndrome of left shoulder: Secondary | ICD-10-CM | POA: Diagnosis not present

## 2020-06-20 DIAGNOSIS — M5412 Radiculopathy, cervical region: Secondary | ICD-10-CM | POA: Diagnosis not present

## 2020-07-14 ENCOUNTER — Other Ambulatory Visit: Payer: Self-pay | Admitting: Obstetrics & Gynecology

## 2020-08-16 ENCOUNTER — Other Ambulatory Visit: Payer: Self-pay

## 2020-08-16 ENCOUNTER — Telehealth (HOSPITAL_COMMUNITY): Payer: Self-pay | Admitting: Family Medicine

## 2020-08-16 ENCOUNTER — Encounter (HOSPITAL_COMMUNITY): Payer: Self-pay

## 2020-08-16 ENCOUNTER — Ambulatory Visit (HOSPITAL_COMMUNITY)
Admission: RE | Admit: 2020-08-16 | Discharge: 2020-08-16 | Disposition: A | Discharge status: Home / Self Care | Payer: Medicaid Other | Source: Ambulatory Visit | Attending: Family Medicine | Admitting: Family Medicine

## 2020-08-16 VITALS — BP 140/97 | HR 88 | Temp 98.6°F

## 2020-08-16 DIAGNOSIS — F419 Anxiety disorder, unspecified: Secondary | ICD-10-CM | POA: Insufficient documentation

## 2020-08-16 DIAGNOSIS — E039 Hypothyroidism, unspecified: Secondary | ICD-10-CM | POA: Diagnosis not present

## 2020-08-16 DIAGNOSIS — F32A Depression, unspecified: Secondary | ICD-10-CM | POA: Insufficient documentation

## 2020-08-16 LAB — CBC WITH DIFFERENTIAL/PLATELET
Abs Immature Granulocytes: 0.03 10*3/uL (ref 0.00–0.07)
Basophils Absolute: 0.1 10*3/uL (ref 0.0–0.1)
Basophils Relative: 1 %
Eosinophils Absolute: 0.1 10*3/uL (ref 0.0–0.5)
Eosinophils Relative: 1 %
HCT: 38.6 % (ref 36.0–46.0)
Hemoglobin: 12.7 g/dL (ref 12.0–15.0)
Immature Granulocytes: 0 %
Lymphocytes Relative: 25 %
Lymphs Abs: 2.1 10*3/uL (ref 0.7–4.0)
MCH: 29.3 pg (ref 26.0–34.0)
MCHC: 32.9 g/dL (ref 30.0–36.0)
MCV: 89.1 fL (ref 80.0–100.0)
Monocytes Absolute: 0.5 10*3/uL (ref 0.1–1.0)
Monocytes Relative: 6 %
Neutro Abs: 5.7 10*3/uL (ref 1.7–7.7)
Neutrophils Relative %: 67 %
Platelets: 271 10*3/uL (ref 150–400)
RBC: 4.33 MIL/uL (ref 3.87–5.11)
RDW: 13.2 % (ref 11.5–15.5)
WBC: 8.5 10*3/uL (ref 4.0–10.5)
nRBC: 0 % (ref 0.0–0.2)

## 2020-08-16 LAB — COMPREHENSIVE METABOLIC PANEL
ALT: 17 U/L (ref 0–44)
AST: 18 U/L (ref 15–41)
Albumin: 3.9 g/dL (ref 3.5–5.0)
Alkaline Phosphatase: 65 U/L (ref 38–126)
Anion gap: 13 (ref 5–15)
BUN: 11 mg/dL (ref 6–20)
CO2: 20 mmol/L — ABNORMAL LOW (ref 22–32)
Calcium: 8.9 mg/dL (ref 8.9–10.3)
Chloride: 104 mmol/L (ref 98–111)
Creatinine, Ser: 0.74 mg/dL (ref 0.44–1.00)
GFR, Estimated: >60 mL/min (ref >60)
Glucose, Bld: 168 mg/dL — ABNORMAL HIGH (ref 70–99)
Potassium: 3.9 mmol/L (ref 3.5–5.1)
Sodium: 137 mmol/L (ref 135–145)
Total Bilirubin: 0.4 mg/dL (ref 0.3–1.2)
Total Protein: 7.3 g/dL (ref 6.5–8.1)

## 2020-08-16 LAB — HEMOGLOBIN A1C
Hgb A1c MFr Bld: 7.9 % — ABNORMAL HIGH (ref 4.8–5.6)
Mean Plasma Glucose: 180.03 mg/dL

## 2020-08-16 LAB — TSH: TSH: 14.866 u[IU]/mL — ABNORMAL HIGH (ref 0.350–4.500)

## 2020-08-16 MED ORDER — SERTRALINE HCL 100 MG PO TABS: ORAL_TABLET | ORAL | 5 refills | Status: AC

## 2020-08-16 MED ORDER — METFORMIN HCL 1000 MG PO TABS: 1000.0000 mg | ORAL_TABLET | Freq: Every day | ORAL | 3 refills | Status: DC

## 2020-08-16 MED ORDER — LEVOTHYROXINE SODIUM 125 MCG PO TABS: ORAL_TABLET | ORAL | 3 refills | Status: DC

## 2020-08-16 MED ORDER — LEVOTHYROXINE SODIUM 150 MCG PO TABS: 150.0000 ug | ORAL_TABLET | Freq: Every day | ORAL | 2 refills | Status: DC

## 2020-08-16 NOTE — Telephone Encounter (Signed)
Went over lab results. All questions answered

## 2020-08-16 NOTE — ED Triage Notes (Signed)
Pt c/o numbness in her toes on her right foot x 2 weeks. Pt also would like refill on her antidepressants and would like her thyroid level checked for her thyroid medication.

## 2020-08-16 NOTE — Discharge Instructions (Addendum)
I have refilled your thyroid medication along with your diabetes medication. Restart your Zoloft You have plenty of refills to get you through until you can get in with primary care. I am drawing some basic lab work to check your thyroid and your hemoglobin A1c. I have referred you to a primary care doctor.  [facility] is a [facility] in [place]. I have also put to contact for tomorrow primary care is in case it takes a while to get into the [facility]. I believe that the pain in your feet is due to neuropathy from diabetes.  I would follow-up with primary care if this problem continues.  They can start you on medication to help with this.

## 2020-08-16 NOTE — ED Provider Notes (Signed)
[facility]    CSN: [id number] Arrival date & time: 08/16/20  5638      History   Chief Complaint Chief Complaint  Patient presents with  . Appointment  . toe numbness  . Medication Refill    HPI Miranda [name] is a 39 y.o. female.   Pt is a 39 year old female that presents with multiple complaints. First complaint being that she has had numbness in her toes off and on for the past 2 weeks. This is in her right foot. She does have hx of diabetes. She takes metformin for this. She does not regularly check her blood sugars. She does not have a primary care doctor. She would also like to restart her Zoloft. She has been on this in the past for anxiety and depression which helps. She is also requesting lab work to check her thyroid. She has been taking her thyroid medicines as prescribed.      Past Medical History:  Diagnosis Date  . Chest pain 07/24/2012   2D Echo EF 60%-65% which shoewd a bicuspid aortic vavle with mild stenosis  . Diabetes mellitus    type 2 on insulin  . Heart valve problem   . Herpes   . Hypothyroidism   . Irregular periods 04/14/2014  . Mental disorder    depression, postpartum depression  . Palpitation   . PVC (premature ventricular contraction) 07/24/2012   stress test which was normal with good excercise    Patient Active Problem List   Diagnosis Date Noted  . Cesarean delivery delivered 11/19/2018  . UTI (urinary tract infection) during pregnancy, first trimester 04/28/2018  . Smoker 10/21/2013  . Depression 10/21/2013  . Palpitations 03/03/2013  . Type II diabetes mellitus ([facility]) 02/04/2013  . Hypothyroidism 01/19/2013    Past Surgical History:  Procedure Laterality Date  . CESAREAN SECTION WITH BILATERAL TUBAL LIGATION N/A 11/19/2018   Procedure: CESAREAN SECTION WITH BILATERAL TUBAL LIGATION;  Surgeon: Lazaro Arms, MD;  Location: [facility];  Service: Obstetrics;  Laterality: N/A;  . INCISION AND DRAINAGE OF  PERITONSILLAR ABCESS N/A 11/19/2018   Procedure: EXCISION OF ABDOMINAL WALL ABCESS;  Surgeon: Lazaro Arms, MD;  Location: [facility];  Service: Obstetrics;  Laterality: N/A;  . WISDOM TOOTH EXTRACTION      OB History    Gravida  2   Para  2   Term  2   Preterm      AB      Living  2     SAB      TAB      Ectopic      Multiple  0   Live Births  2            Home Medications    Prior to Admission medications   Medication Sig Start Date End Date Taking? Authorizing Provider  levothyroxine (SYNTHROID) 125 MCG tablet TAKE 1 TABLET(125 MCG) BY MOUTH DAILY BEFORE BREAKFAST 08/16/20   Archit Leger A, NP  metFORMIN (GLUCOPHAGE) 1000 MG tablet Take 1 tablet (1,000 mg total) by mouth daily with breakfast. 08/16/20   Dahlia Byes A, NP  sertraline (ZOLOFT) 100 MG tablet TAKE 1 TABLET(100 MG) BY MOUTH DAILY 08/16/20   Dahlia Byes A, NP  acyclovir (ZOVIRAX) 400 MG tablet TAKE 1 TABLET BY MOUTH TWICE DAILY 08/07/19   Tilda Burrow, MD    Family History Family History  Problem Relation Age of Onset  . Hypertension Mother   .  Diabetes Brother   . Diabetes Maternal Grandmother   . Congestive Heart Failure Maternal Grandmother   . Hearing loss Daughter     Social History Social History   Tobacco Use  . Smoking status: Current Every Day Smoker    Packs/day: 0.25    Years: 5.00    Pack years: 1.25    Types: Cigarettes    Last attempt to quit: 05/20/2012    Years since quitting: 8.2  . Smokeless tobacco: Never Used  . Tobacco comment: 5 cigs per day  Vaping Use  . Vaping Use: Never used  Substance Use Topics  . Alcohol use: No    Alcohol/week: 0.0 standard drinks  . Drug use: No     Allergies   Keflex [cephalexin]   Review of Systems Review of Systems   Physical Exam Triage Vital Signs ED Triage Vitals  Enc Vitals Group     BP 08/16/20 1013 (!) 140/97     Pulse Rate 08/16/20 1013 88     Resp --      Temp 08/16/20 1013 98.6 F (37 C)      Temp Source 08/16/20 1013 Oral     SpO2 08/16/20 1013 100 %     Weight --      Height --      Head Circumference --      Peak Flow --      Pain Score 08/16/20 1010 0     Pain Loc --      Pain Edu? --      Excl. in GC? --    No data found.  Updated Vital Signs BP (!) 140/97 (BP Location: Left Arm)   Pulse 88   Temp 98.6 F (37 C) (Oral)   LMP 07/22/2020   SpO2 100%   Visual Acuity Right Eye Distance:   Left Eye Distance:   Bilateral Distance:    Right Eye Near:   Left Eye Near:    Bilateral Near:     Physical Exam Vitals and nursing note reviewed.  Constitutional:      General: She is not in acute distress.    Appearance: Normal appearance. She is not ill-appearing, toxic-appearing or diaphoretic.  HENT:     Head: Normocephalic.     Nose: Nose normal.  Eyes:     Conjunctiva/sclera: Conjunctivae normal.  Pulmonary:     Effort: Pulmonary effort is normal.  Musculoskeletal:        General: Normal range of motion.     Cervical back: Normal range of motion.     Comments: Right foot normal color, temperature.  2+ pedal pulse. No swelling or erythema No sores   Skin:    General: Skin is warm and dry.     Findings: No rash.  Neurological:     Mental Status: She is alert.  Psychiatric:        Mood and Affect: Mood normal.      UC Treatments / Results  Labs (all labs ordered are listed, but only abnormal results are displayed) Labs Reviewed  CBC WITH DIFFERENTIAL/PLATELET  COMPREHENSIVE METABOLIC PANEL  TSH  HEMOGLOBIN A1C    EKG   Radiology No results found.  Procedures Procedures (including critical care time)  Medications Ordered in UC Medications - No data to display  Initial Impression / Assessment and Plan / UC Course  I have reviewed the triage vital signs and the nursing notes.  Pertinent labs & imaging results that were available during my care  of the patient were reviewed by me and considered in my medical decision making (see chart  for details).     Anxiety depression Restart Zoloft as requested.  Hypothyroidism Refilled medication and checking TSH  Neuropathy from diabetes Most likely the cause of her toe pain, numbness. Refill Metformin We will have her follow-up with primary care for this  Lab work pending Referral put in for community health monitor primary care Final Clinical Impressions(s) / UC Diagnoses   Final diagnoses:  Anxiety  Depression, unspecified depression type  Hypothyroidism, unspecified type     Discharge Instructions     I have refilled your thyroid medication along with your diabetes medication. Restart your Zoloft You have plenty of refills to get you through until you can get in with primary care. I am drawing some basic lab work to check your thyroid and your hemoglobin A1c. I have referred you to a primary care doctor.  This is a [facility] in [place]. I have also put to contact for tomorrow primary care is in case it takes a while to get into the community [facility] wellness [facility]. I believe that the pain in your feet is due to neuropathy from diabetes.  I would follow-up with primary care if this problem continues.  They can start you on medication to help with this.     ED Prescriptions    Medication Sig Dispense Auth. Provider   levothyroxine (SYNTHROID) 125 MCG tablet TAKE 1 TABLET(125 MCG) BY MOUTH DAILY BEFORE BREAKFAST 30 tablet Decklyn Hornik A, NP   metFORMIN (GLUCOPHAGE) 1000 MG tablet Take 1 tablet (1,000 mg total) by mouth daily with breakfast. 60 tablet Dorinda Stehr A, NP   sertraline (ZOLOFT) 100 MG tablet TAKE 1 TABLET(100 MG) BY MOUTH DAILY 30 tablet Jolayne Branson A, NP     PDMP not reviewed this encounter.   Janace Aris, NP 08/16/20 1108

## 2020-09-20 ENCOUNTER — Other Ambulatory Visit: Payer: Medicaid Other | Admitting: Obstetrics & Gynecology

## 2020-10-28 DIAGNOSIS — J029 Acute pharyngitis, unspecified: Secondary | ICD-10-CM | POA: Diagnosis not present

## 2020-10-28 DIAGNOSIS — R519 Headache, unspecified: Secondary | ICD-10-CM | POA: Diagnosis not present

## 2020-10-28 DIAGNOSIS — J0100 Acute maxillary sinusitis, unspecified: Secondary | ICD-10-CM | POA: Diagnosis not present

## 2020-10-31 DIAGNOSIS — U071 COVID-19: Secondary | ICD-10-CM | POA: Diagnosis not present

## 2020-10-31 DIAGNOSIS — R0981 Nasal congestion: Secondary | ICD-10-CM | POA: Diagnosis not present

## 2020-10-31 DIAGNOSIS — R070 Pain in throat: Secondary | ICD-10-CM | POA: Diagnosis not present

## 2020-10-31 DIAGNOSIS — R059 Cough, unspecified: Secondary | ICD-10-CM | POA: Diagnosis not present

## 2020-11-09 ENCOUNTER — Other Ambulatory Visit: Payer: Self-pay

## 2020-11-11 MED ORDER — ACYCLOVIR 400 MG PO TABS: 400.0000 mg | ORAL_TABLET | Freq: Two times a day (BID) | ORAL | 0 refills | Status: DC

## 2020-11-22 ENCOUNTER — Other Ambulatory Visit (HOSPITAL_COMMUNITY)
Admission: RE | Admit: 2020-11-22 | Discharge: 2020-11-22 | Disposition: A | Discharge status: Home / Self Care | Payer: Medicaid Other | Source: Ambulatory Visit | Attending: Obstetrics & Gynecology | Admitting: Obstetrics & Gynecology

## 2020-11-22 ENCOUNTER — Encounter: Payer: Self-pay | Admitting: Obstetrics & Gynecology

## 2020-11-22 ENCOUNTER — Ambulatory Visit: Payer: Medicaid Other | Admitting: Obstetrics & Gynecology

## 2020-11-22 ENCOUNTER — Other Ambulatory Visit: Payer: Self-pay

## 2020-11-22 VITALS — BP 144/98 | HR 98 | Ht 68.0 in | Wt 233.0 lb

## 2020-11-22 DIAGNOSIS — B373 Candidiasis of vulva and vagina: Secondary | ICD-10-CM | POA: Diagnosis not present

## 2020-11-22 DIAGNOSIS — E038 Other specified hypothyroidism: Secondary | ICD-10-CM

## 2020-11-22 DIAGNOSIS — N912 Amenorrhea, unspecified: Secondary | ICD-10-CM

## 2020-11-22 DIAGNOSIS — R7309 Other abnormal glucose: Secondary | ICD-10-CM

## 2020-11-22 DIAGNOSIS — B3731 Acute candidiasis of vulva and vagina: Secondary | ICD-10-CM

## 2020-11-22 DIAGNOSIS — Z124 Encounter for screening for malignant neoplasm of cervix: Secondary | ICD-10-CM | POA: Diagnosis not present

## 2020-11-22 LAB — POCT URINE PREGNANCY: Preg Test, Ur: NEGATIVE

## 2020-11-22 MED ORDER — LEVOTHYROXINE SODIUM 150 MCG PO TABS: 150.0000 ug | ORAL_TABLET | Freq: Every day | ORAL | 2 refills | Status: DC

## 2020-11-22 MED ORDER — TERCONAZOLE 0.4 % VA CREA: 1.0000 | TOPICAL_CREAM | Freq: Every day | VAGINAL | 0 refills | Status: DC

## 2020-11-22 MED ORDER — MEDROXYPROGESTERONE ACETATE 10 MG PO TABS: 10.0000 mg | ORAL_TABLET | Freq: Every day | ORAL | 0 refills | Status: DC

## 2020-11-22 NOTE — Progress Notes (Signed)
Chief Complaint  Patient presents with  . Amenorrhea    ? Yeast infection      40 y.o. Miranda [name] Patient's last menstrual period was 08/22/2020 (approximate). The current method of family planning is tubal ligation.  Outpatient Encounter Medications as of 11/22/2020  Medication Sig  . medroxyPROGESTERone (PROVERA) 10 MG tablet Take 1 tablet (10 mg total) by mouth daily.  . metFORMIN (GLUCOPHAGE) 1000 MG tablet Take 1 tablet (1,000 mg total) by mouth daily with breakfast.  . terconazole (TERAZOL 7) 0.4 % vaginal cream Place 1 applicator vaginally at bedtime.  . [DISCONTINUED] levothyroxine (SYNTHROID) 150 MCG tablet Take 1 tablet (150 mcg total) by mouth daily before breakfast.  . acyclovir (ZOVIRAX) 400 MG tablet Take 1 tablet (400 mg total) by mouth 2 (two) times daily.  Marland Kitchen levothyroxine (SYNTHROID) 150 MCG tablet Take 1 tablet (150 mcg total) by mouth daily before breakfast.  . sertraline (ZOLOFT) 100 MG tablet TAKE 1 TABLET(100 MG) BY MOUTH DAILY (Patient not taking: Reported on 11/22/2020)   No facility-administered encounter medications on file as of 11/22/2020.    Subjective Pt with itchy white vaginal discharge for some time  Recurrent Also no menses for 3 months Wondering about thyroid levels UPT negative S/p BTL  Past Medical History:  Diagnosis Date  . Chest pain 07/24/2012   2D Echo EF 60%-65% which shoewd a bicuspid aortic vavle with mild stenosis  . Diabetes mellitus    type 2 on insulin  . Heart valve problem   . Herpes   . Hypothyroidism   . Irregular periods 04/14/2014  . Mental disorder    depression, postpartum depression  . Palpitation   . PVC (premature ventricular contraction) 07/24/2012   stress test which was normal with good excercise    Past Surgical History:  Procedure Laterality Date  . CESAREAN SECTION WITH BILATERAL TUBAL LIGATION N/A 11/19/2018   Procedure: CESAREAN SECTION WITH BILATERAL TUBAL LIGATION;  Surgeon: Lazaro Arms, MD;   Location: [facility] BIRTHING SUITES;  Service: Obstetrics;  Laterality: N/A;  . INCISION AND DRAINAGE OF PERITONSILLAR ABCESS N/A 11/19/2018   Procedure: EXCISION OF ABDOMINAL WALL ABCESS;  Surgeon: Lazaro Arms, MD;  Location: [facility] BIRTHING SUITES;  Service: Obstetrics;  Laterality: N/A;  . WISDOM TOOTH EXTRACTION      OB History    Gravida  2   Para  2   Term  2   Preterm      AB      Living  2     SAB      IAB      Ectopic      Multiple  0   Live Births  2           Allergies  Allergen Reactions  . Keflex [Cephalexin] Nausea And Vomiting    Social History   Socioeconomic History  . Marital status: Divorced    Spouse name: Not on file  . Number of children: 2  . Years of education: Not on file  . Highest education level: Not on file  Occupational History  . Not on file  Tobacco Use  . Smoking status: Current Every Day Smoker    Packs/day: 0.25    Years: 5.00    Pack years: 1.25    Types: Cigarettes    Last attempt to quit: 05/20/2012    Years since quitting: 8.5  . Smokeless tobacco: Never Used  . Tobacco comment: 5 cigs per day  Vaping  Use  . Vaping Use: Never used  Substance and Sexual Activity  . Alcohol use: No    Alcohol/week: 0.0 standard drinks  . Drug use: No  . Sexual activity: Not Currently    Partners: Male    Birth control/protection: None  Other Topics Concern  . Not on file  Social History Narrative  . Not on file   Social Determinants of Health   Financial Resource Strain: Not on file  Food Insecurity: Not on file  Transportation Needs: Not on file  Physical Activity: Not on file  Stress: Not on file  Social Connections: Not on file    Family History  Problem Relation Age of Onset  . Hypertension Mother   . Diabetes Brother   . Diabetes Maternal Grandmother   . Congestive Heart Failure Maternal Grandmother   . Hearing loss Daughter     Medications:       Current Outpatient Medications:  .  medroxyPROGESTERone  (PROVERA) 10 MG tablet, Take 1 tablet (10 mg total) by mouth daily., Disp: 10 tablet, Rfl: 0 .  metFORMIN (GLUCOPHAGE) 1000 MG tablet, Take 1 tablet (1,000 mg total) by mouth daily with breakfast., Disp: 60 tablet, Rfl: 3 .  terconazole (TERAZOL 7) 0.4 % vaginal cream, Place 1 applicator vaginally at bedtime., Disp: 45 g, Rfl: 0 .  acyclovir (ZOVIRAX) 400 MG tablet, Take 1 tablet (400 mg total) by mouth 2 (two) times daily., Disp: 60 tablet, Rfl: 0 .  levothyroxine (SYNTHROID) 150 MCG tablet, Take 1 tablet (150 mcg total) by mouth daily before breakfast., Disp: 30 tablet, Rfl: 2 .  sertraline (ZOLOFT) 100 MG tablet, TAKE 1 TABLET(100 MG) BY MOUTH DAILY (Patient not taking: Reported on 11/22/2020), Disp: 30 tablet, Rfl: 5  Objective Blood pressure (!) 144/98, pulse 98, height 5\' 8"  (1.727 m), weight 233 lb (105.7 kg), last menstrual period 08/22/2020.  General WDWN female NAD Vulva:  normal appearing vulva with no masses, tenderness or lesions Vagina:  normal mucosa, +yeast Cervix:  Normal no lesions Uterus:  normal size, contour, position, consistency, mobility, non-tender Adnexa: ovaries:present,  normal adnexa in size, nontender and no masses   Pertinent ROS No burning with urination, frequency or urgency No nausea, vomiting or diarrhea Nor fever chills or other constitutional symptoms   Labs or studies     Impression Diagnoses this Encounter::   ICD-10-CM   1. Amenorrhea  N91.2 POCT urine pregnancy   provera challenge test  2. Routine cervical smear  Z12.4 Cytology - PAP( [place])  3. Other specified hypothyroidism  E03.8 TSH  4. Yeast infection of the vagina  B37.3   5. Elevated hemoglobin A1c  R73.09 Hemoglobin A1C    Established relevant diagnosis(es):   Plan/Recommendations: Meds ordered this encounter  Medications  . terconazole (TERAZOL 7) 0.4 % vaginal cream    Sig: Place 1 applicator vaginally at bedtime.    Dispense:  45 g    Refill:  0  .  medroxyPROGESTERone (PROVERA) 10 MG tablet    Sig: Take 1 tablet (10 mg total) by mouth daily.    Dispense:  10 tablet    Refill:  0  . levothyroxine (SYNTHROID) 150 MCG tablet    Sig: Take 1 tablet (150 mcg total) by mouth daily before breakfast.    Dispense:  30 tablet    Refill:  2    Labs or Scans Ordered: Orders Placed This Encounter  Procedures  . TSH  . Hemoglobin A1C  . POCT urine pregnancy  Management:: As above noted  Follow up Return if symptoms worsen or fail to improve.        All questions were answered.

## 2020-11-23 LAB — HEMOGLOBIN A1C
Est. average glucose Bld gHb Est-mCnc: 206 mg/dL
Hgb A1c MFr Bld: 8.8 % — ABNORMAL HIGH (ref 4.8–5.6)

## 2020-11-23 LAB — TSH: TSH: 4.32 u[IU]/mL (ref 0.450–4.500)

## 2020-11-24 LAB — CYTOLOGY - PAP
Adequacy: ABSENT
Chlamydia: NEGATIVE
Comment: NEGATIVE
Comment: NEGATIVE
Comment: NORMAL
Diagnosis: NEGATIVE
High risk HPV: NEGATIVE
Neisseria Gonorrhea: NEGATIVE

## 2020-11-25 ENCOUNTER — Encounter: Payer: Self-pay | Admitting: Obstetrics & Gynecology

## 2020-12-27 ENCOUNTER — Other Ambulatory Visit: Payer: Medicaid Other | Admitting: Obstetrics & Gynecology

## 2021-01-04 ENCOUNTER — Other Ambulatory Visit: Payer: Self-pay | Admitting: Obstetrics & Gynecology

## 2021-02-19 ENCOUNTER — Other Ambulatory Visit: Payer: Self-pay | Admitting: Women's Health

## 2021-02-27 ENCOUNTER — Other Ambulatory Visit: Payer: Self-pay | Admitting: Obstetrics & Gynecology

## 2021-03-09 DIAGNOSIS — R0982 Postnasal drip: Secondary | ICD-10-CM | POA: Diagnosis not present

## 2021-03-09 DIAGNOSIS — J069 Acute upper respiratory infection, unspecified: Secondary | ICD-10-CM | POA: Diagnosis not present

## 2021-03-09 DIAGNOSIS — J3489 Other specified disorders of nose and nasal sinuses: Secondary | ICD-10-CM | POA: Diagnosis not present

## 2021-04-24 DIAGNOSIS — G5602 Carpal tunnel syndrome, left upper limb: Secondary | ICD-10-CM | POA: Diagnosis not present

## 2021-05-03 ENCOUNTER — Ambulatory Visit (INDEPENDENT_AMBULATORY_CARE_PROVIDER_SITE_OTHER): Payer: Medicaid Other | Admitting: Orthopaedic Surgery

## 2021-05-03 ENCOUNTER — Encounter: Payer: Self-pay | Admitting: Orthopaedic Surgery

## 2021-05-03 ENCOUNTER — Other Ambulatory Visit: Payer: Self-pay

## 2021-05-03 VITALS — Ht 68.0 in | Wt 200.0 lb

## 2021-05-03 DIAGNOSIS — R200 Anesthesia of skin: Secondary | ICD-10-CM

## 2021-05-03 DIAGNOSIS — G5602 Carpal tunnel syndrome, left upper limb: Secondary | ICD-10-CM | POA: Diagnosis not present

## 2021-05-03 NOTE — Progress Notes (Signed)
Office Visit Note   Patient: Miranda [name]           Date of Birth: Oct 13, 1980           MRN: [id number] Visit Date: 05/03/2021              Requested by: [facility] [address] [place],  [place] [address] PCP: [facility]   Assessment & Plan: Visit Diagnoses:  1. Numbness of left hand     Plan: 49-month history of left nondominant hand numbness and tingling that awakens her at night.  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  Having little if any trouble with the right side.  She went to urgent care about a week and a half ago and was given oral prednisone which did not help.  She does have positive Tinel's over the median nerve with what I believe is carpal tunnel syndrome.  We will place her in a volar wrist splint to help her sleep at night and order EMGs and nerve conduction studies.  Not having any problem with her neck or shoulder  Follow-Up Instructions: Return After EMG and nerve conduction studies left upper extremity.   Orders:  Orders Placed This Encounter  Procedures   Ambulatory referral to Neurosurgery   No orders of the defined types were placed in this encounter.     Procedures: No procedures performed   Clinical Data: No additional findings.   Subjective: Chief Complaint  Patient presents with   Left Hand - Numbness  Patient presents today for left hand pain and numbness. She said that it has been going on for a couple months. She has tingling up her arm. It use to wake her at night, but now is completely numb. She went to an urgent care over a week ago and was given oral prednisone, with no relief. She is right hand dominant.  HPI  Review of Systems   Objective: Vital Signs: Ht 5\' 8"  (1.727 m)   Wt 200 lb (90.7 kg)   BMI 30.41 kg/m   Physical Exam Constitutional:      Appearance: She is well-developed.  Eyes:     Pupils: Pupils are equal, round, and reactive to  light.  Pulmonary:     Effort: Pulmonary effort is normal.  Skin:    General: Skin is warm and dry.  Neurological:     Mental Status: She is alert and oriented to person, place, and time.  Psychiatric:        Behavior: Behavior normal.    Ortho Exam left hand nondominant.  Positive Tinel's over the median nerve.  No swelling of her hand or digits.  Normal opposition of thumb to little finger.  Does have some altered sensation at the tips of the thumb index long and to some extent the ring finger.  Normal AB duction and ad duction.  Good capillary refill to all of her digits.  Specialty Comments:  No specialty comments available.  Imaging: No results found.   PMFS History: Patient Active Problem List   Diagnosis Date Noted   Carpal tunnel syndrome, left upper limb 05/03/2021   Cesarean delivery delivered 11/19/2018   UTI (urinary tract infection) during pregnancy, first trimester 04/28/2018   Smoker 10/21/2013   Depression 10/21/2013   Palpitations 03/03/2013   Type II diabetes mellitus (HCC) 02/04/2013   Hypothyroidism 01/19/2013   Past Medical History:  Diagnosis Date   Chest  pain 07/24/2012   2D Echo EF 60%-65% which shoewd a bicuspid aortic vavle with mild stenosis   Diabetes mellitus    type 2 on insulin   Heart valve problem    Herpes    Hypothyroidism    Irregular periods 04/14/2014   Mental disorder    depression, postpartum depression   Palpitation    PVC (premature ventricular contraction) 07/24/2012   stress test which was normal with good excercise    Family History  Problem Relation Age of Onset   Hypertension Mother    Diabetes Brother    Diabetes Maternal Grandmother    Congestive Heart Failure Maternal Grandmother    Hearing loss Daughter     Past Surgical History:  Procedure Laterality Date   CESAREAN SECTION WITH BILATERAL TUBAL LIGATION N/A 11/19/2018   Procedure: CESAREAN SECTION WITH BILATERAL TUBAL LIGATION;  Surgeon: Lazaro Arms, MD;   Location: [facility] BIRTHING SUITES;  Service: Obstetrics;  Laterality: N/A;   INCISION AND DRAINAGE OF PERITONSILLAR ABCESS N/A 11/19/2018   Procedure: EXCISION OF ABDOMINAL WALL ABCESS;  Surgeon: Lazaro Arms, MD;  Location: [facility] BIRTHING SUITES;  Service: Obstetrics;  Laterality: N/A;   WISDOM TOOTH EXTRACTION     Social History   Occupational History   Not on file  Tobacco Use   Smoking status: Every Day    Packs/day: 0.25    Years: 5.00    Pack years: 1.25    Types: Cigarettes    Last attempt to quit: 05/20/2012    Years since quitting: 8.9   Smokeless tobacco: Never   Tobacco comments:    5 cigs per day  Vaping Use   Vaping Use: Never used  Substance and Sexual Activity   Alcohol use: No    Alcohol/week: 0.0 standard drinks   Drug use: No   Sexual activity: Not Currently    Partners: Male    Birth control/protection: None

## 2021-05-11 DIAGNOSIS — N181 Chronic kidney disease, stage 1: Secondary | ICD-10-CM | POA: Diagnosis not present

## 2021-05-11 DIAGNOSIS — R21 Rash and other nonspecific skin eruption: Secondary | ICD-10-CM | POA: Diagnosis not present

## 2021-05-11 DIAGNOSIS — Z760 Encounter for issue of repeat prescription: Secondary | ICD-10-CM | POA: Diagnosis not present

## 2021-05-11 DIAGNOSIS — E1122 Type 2 diabetes mellitus with diabetic chronic kidney disease: Secondary | ICD-10-CM | POA: Diagnosis not present

## 2021-05-16 DIAGNOSIS — W268XXA Contact with other sharp object(s), not elsewhere classified, initial encounter: Secondary | ICD-10-CM | POA: Diagnosis not present

## 2021-05-16 DIAGNOSIS — S61412A Laceration without foreign body of left hand, initial encounter: Secondary | ICD-10-CM | POA: Diagnosis not present

## 2021-05-17 ENCOUNTER — Telehealth: Payer: Self-pay

## 2021-05-17 NOTE — Telephone Encounter (Signed)
Transition Care Management Unsuccessful Follow-up Telephone Call  Date of discharge and from where:  05/16/2021[facility]   Attempts:  1st Attempt  Reason for unsuccessful TCM follow-up call:  Unable to reach patient

## 2021-05-18 ENCOUNTER — Telehealth: Payer: Self-pay | Admitting: Orthopaedic Surgery

## 2021-05-18 NOTE — Telephone Encounter (Signed)
[facility] has called pt several times to get an appt set up and phone number is no longer active. The only number on file is [phone] which is the same number they have called.

## 2021-05-18 NOTE — Telephone Encounter (Signed)
Transition Care Management Unsuccessful Follow-up Telephone Call  Date of discharge and from where:  05/16/2021-[facility] Miranda [name]   Attempts:  2nd Attempt  Reason for unsuccessful TCM follow-up call:  Unable to reach patient

## 2021-05-19 NOTE — Telephone Encounter (Signed)
Noted, will have to wait when pt calls to check status, I have tried also

## 2021-05-19 NOTE — Telephone Encounter (Signed)
Transition Care Management Unsuccessful Follow-up Telephone Call  Date of discharge and from where:  05/16/2021[facility]   Attempts:  3rd Attempt  Reason for unsuccessful TCM follow-up call:  Unable to reach patient

## 2021-05-23 DIAGNOSIS — S61412D Laceration without foreign body of left hand, subsequent encounter: Secondary | ICD-10-CM | POA: Diagnosis not present

## 2021-05-23 DIAGNOSIS — Z23 Encounter for immunization: Secondary | ICD-10-CM | POA: Diagnosis not present

## 2021-05-26 DIAGNOSIS — S61412D Laceration without foreign body of left hand, subsequent encounter: Secondary | ICD-10-CM | POA: Diagnosis not present

## 2021-05-26 DIAGNOSIS — Z5189 Encounter for other specified aftercare: Secondary | ICD-10-CM | POA: Diagnosis not present

## 2021-05-30 DIAGNOSIS — S61402A Unspecified open wound of left hand, initial encounter: Secondary | ICD-10-CM | POA: Diagnosis not present

## 2021-05-30 DIAGNOSIS — L03114 Cellulitis of left upper limb: Secondary | ICD-10-CM | POA: Diagnosis not present

## 2021-05-30 DIAGNOSIS — Z4802 Encounter for removal of sutures: Secondary | ICD-10-CM | POA: Diagnosis not present

## 2021-06-02 DIAGNOSIS — S61412A Laceration without foreign body of left hand, initial encounter: Secondary | ICD-10-CM | POA: Diagnosis not present

## 2021-06-30 ENCOUNTER — Encounter: Payer: Self-pay | Admitting: *Deleted

## 2021-07-31 DIAGNOSIS — R1030 Lower abdominal pain, unspecified: Secondary | ICD-10-CM | POA: Diagnosis not present

## 2021-07-31 DIAGNOSIS — R350 Frequency of micturition: Secondary | ICD-10-CM | POA: Diagnosis not present

## 2021-07-31 DIAGNOSIS — Z7251 High risk heterosexual behavior: Secondary | ICD-10-CM | POA: Diagnosis not present

## 2021-08-01 ENCOUNTER — Ambulatory Visit: Payer: Medicaid Other

## 2021-09-11 ENCOUNTER — Other Ambulatory Visit: Payer: Self-pay

## 2021-09-11 ENCOUNTER — Encounter: Payer: Self-pay | Admitting: Obstetrics & Gynecology

## 2021-09-11 ENCOUNTER — Ambulatory Visit: Payer: Medicaid Other | Admitting: Obstetrics & Gynecology

## 2021-09-11 VITALS — BP 138/85 | HR 89 | Ht 68.0 in | Wt 219.0 lb

## 2021-09-11 DIAGNOSIS — F418 Other specified anxiety disorders: Secondary | ICD-10-CM

## 2021-09-11 DIAGNOSIS — Z113 Encounter for screening for infections with a predominantly sexual mode of transmission: Secondary | ICD-10-CM

## 2021-09-11 DIAGNOSIS — N390 Urinary tract infection, site not specified: Secondary | ICD-10-CM

## 2021-09-11 DIAGNOSIS — E119 Type 2 diabetes mellitus without complications: Secondary | ICD-10-CM | POA: Diagnosis not present

## 2021-09-11 LAB — POCT URINALYSIS DIPSTICK OB
Blood, UA: NEGATIVE
Glucose, UA: NEGATIVE
Leukocytes, UA: NEGATIVE
Nitrite, UA: NEGATIVE
POC,PROTEIN,UA: NEGATIVE

## 2021-09-11 MED ORDER — METFORMIN HCL 1000 MG PO TABS: 1000.0000 mg | ORAL_TABLET | Freq: Every day | ORAL | 3 refills | Status: DC

## 2021-09-11 MED ORDER — PAROXETINE HCL 20 MG PO TABS: 20.0000 mg | ORAL_TABLET | Freq: Every day | ORAL | 1 refills | Status: DC

## 2021-09-11 NOTE — Progress Notes (Signed)
Chief Complaint  Patient presents with   Urinary Tract Infection    Also just wants to talk with Dr. Despina Hidden      40 y.o. [id number] Patient's last menstrual period was 09/08/2021. The current method of family planning is tubal ligation.  Outpatient Encounter Medications as of 09/11/2021  Medication Sig   acyclovir (ZOVIRAX) 400 MG tablet TAKE 1 TABLET(400 MG) BY MOUTH TWICE DAILY   levothyroxine (SYNTHROID) 150 MCG tablet TAKE 1 TABLET(150 MCG) BY MOUTH DAILY BEFORE BREAKFAST   PARoxetine (PAXIL) 20 MG tablet Take 1 tablet (20 mg total) by mouth daily.   [DISCONTINUED] metFORMIN (GLUCOPHAGE) 1000 MG tablet Take 1 tablet (1,000 mg total) by mouth daily with breakfast.   medroxyPROGESTERone (PROVERA) 10 MG tablet TAKE 1 TABLET(10 MG) BY MOUTH DAILY (Patient not taking: Reported on 09/11/2021)   metFORMIN (GLUCOPHAGE) 1000 MG tablet Take 1 tablet (1,000 mg total) by mouth daily with breakfast.   sertraline (ZOLOFT) 100 MG tablet TAKE 1 TABLET(100 MG) BY MOUTH DAILY (Patient not taking: Reported on 09/11/2021)   [DISCONTINUED] terconazole (TERAZOL 7) 0.4 % vaginal cream Place 1 applicator vaginally at bedtime.   No facility-administered encounter medications on file as of 09/11/2021.    Subjective Pt with increasing anxiety history in past Was on zoloft without effect I would prefer to avoid benzodiazepines Refill metformon Past Medical History:  Diagnosis Date   Chest pain 07/24/2012   2D Echo EF 60%-65% which shoewd a bicuspid aortic vavle with mild stenosis   Diabetes mellitus    type 2 on insulin   Heart valve problem    Herpes    Hypothyroidism    Irregular periods 04/14/2014   Mental disorder    depression, postpartum depression   Palpitation    PVC (premature ventricular contraction) 07/24/2012   stress test which was normal with good excercise    Past Surgical History:  Procedure Laterality Date   CESAREAN SECTION WITH BILATERAL TUBAL LIGATION N/A 11/19/2018    Procedure: CESAREAN SECTION WITH BILATERAL TUBAL LIGATION;  Surgeon: Lazaro Arms, MD;  Location: [facility] BIRTHING SUITES;  Service: Obstetrics;  Laterality: N/A;   INCISION AND DRAINAGE OF PERITONSILLAR ABCESS N/A 11/19/2018   Procedure: EXCISION OF ABDOMINAL WALL ABCESS;  Surgeon: Lazaro Arms, MD;  [facility] BIRTHING SUITES;  Service: Obstetrics;  Laterality: N/A;   WISDOM TOOTH EXTRACTION      OB History     Gravida  2   Para  2   Term  2   Preterm      AB      Living  2      SAB      IAB      Ectopic      Multiple  0   Live Births  2           Allergies  Allergen Reactions   Keflex [Cephalexin] Nausea And Vomiting    Social History   Socioeconomic History   Marital status: Divorced    Spouse name: Not on file   Number of children: 2   Years of education: Not on file   Highest education level: Not on file  Occupational History   Not on file  Tobacco Use   Smoking status: Every Day    Packs/day: 0.25    Years: 5.00    Pack years: 1.25    Types: Cigarettes    Last attempt to quit: 05/20/2012    Years since quitting: 9.3  Smokeless tobacco: Never   Tobacco comments:    5 cigs per day  Vaping Use   Vaping Use: Never used  Substance and Sexual Activity   Alcohol use: No    Alcohol/week: 0.0 standard drinks   Drug use: No   Sexual activity: Not Currently    Partners: Male    Birth control/protection: None  Other Topics Concern   Not on file  Social History Narrative   Not on file   Social Determinants of Health   Financial Resource Strain: Not on file  Food Insecurity: Not on file  Transportation Needs: Not on file  Physical Activity: Not on file  Stress: Not on file  Social Connections: Not on file    Family History  Problem Relation Age of Onset   Hypertension Mother    Diabetes Brother    Diabetes Maternal Grandmother    Congestive Heart Failure Maternal Grandmother    Hearing loss Daughter     Medications:        Current Outpatient Medications:    acyclovir (ZOVIRAX) 400 MG tablet, TAKE 1 TABLET(400 MG) BY MOUTH TWICE DAILY, Disp: 60 tablet, Rfl: 11   levothyroxine (SYNTHROID) 150 MCG tablet, TAKE 1 TABLET(150 MCG) BY MOUTH DAILY BEFORE BREAKFAST, Disp: 30 tablet, Rfl: 11   PARoxetine (PAXIL) 20 MG tablet, Take 1 tablet (20 mg total) by mouth daily., Disp: 30 tablet, Rfl: 1   medroxyPROGESTERone (PROVERA) 10 MG tablet, TAKE 1 TABLET(10 MG) BY MOUTH DAILY (Patient not taking: Reported on 09/11/2021), Disp: 10 tablet, Rfl: 11   metFORMIN (GLUCOPHAGE) 1000 MG tablet, Take 1 tablet (1,000 mg total) by mouth daily with breakfast., Disp: 60 tablet, Rfl: 3   sertraline (ZOLOFT) 100 MG tablet, TAKE 1 TABLET(100 MG) BY MOUTH DAILY (Patient not taking: Reported on 09/11/2021), Disp: 30 tablet, Rfl: 5  Objective Blood pressure 138/85, pulse 89, height 5\' 8"  (1.727 m), weight 219 lb (99.3 kg), last menstrual period 09/08/2021.  Gen WDWN NAD  Pertinent ROS No burning with urination, frequency or urgency No nausea, vomiting or diarrhea Nor fever chills or other constitutional symptoms   Labs or studies A1C 8.8 2/22    Impression Diagnoses this Encounter::   ICD-10-CM   1. Anxiety with depression  F41.8    begin Paxil 20 mg qAM    2. Urinary tract infection without hematuria, site unspecified  N39.0 POC Urinalysis Dipstick OB    GC/Chlamydia Probe Amp    Urine Culture    3. Screen for STD (sexually transmitted disease)  Z11.3 GC/Chlamydia Probe Amp    4. Type 2 diabetes mellitus without complication, without long-term current use of insulin (HCC)  E11.9 Hemoglobin A1C      Established relevant diagnosis(es):   Plan/Recommendations: Meds ordered this encounter  Medications   PARoxetine (PAXIL) 20 MG tablet    Sig: Take 1 tablet (20 mg total) by mouth daily.    Dispense:  30 tablet    Refill:  1   metFORMIN (GLUCOPHAGE) 1000 MG tablet    Sig: Take 1 tablet (1,000 mg total) by mouth daily  with breakfast.    Dispense:  60 tablet    Refill:  3    Labs or Scans Ordered: Orders Placed This Encounter  Procedures   GC/Chlamydia Probe Amp   Urine Culture   Hemoglobin A1C   POC Urinalysis Dipstick OB    Management:: Begin Paxil, recheck 6 weeks  Follow up Return in about 6 weeks (around 10/23/2021) for Follow up, with  Dr Elonda Husky.       All questions were answered.

## 2021-09-12 LAB — HEMOGLOBIN A1C
Est. average glucose Bld gHb Est-mCnc: 189 mg/dL
Hgb A1c MFr Bld: 8.2 % — ABNORMAL HIGH (ref 4.8–5.6)

## 2021-09-15 LAB — URINE CULTURE: Organism ID, Bacteria: NO GROWTH

## 2021-09-15 LAB — GC/CHLAMYDIA PROBE AMP
Chlamydia trachomatis, NAA: NEGATIVE
Neisseria Gonorrhoeae by PCR: NEGATIVE

## 2021-09-25 ENCOUNTER — Encounter: Payer: Self-pay | Admitting: Obstetrics & Gynecology

## 2021-09-25 MED ORDER — SULFAMETHOXAZOLE-TRIMETHOPRIM 800-160 MG PO TABS: 1.0000 | ORAL_TABLET | Freq: Two times a day (BID) | ORAL | 0 refills | Status: DC

## 2021-10-23 ENCOUNTER — Ambulatory Visit: Payer: Medicaid Other | Admitting: Obstetrics & Gynecology

## 2021-10-31 ENCOUNTER — Encounter: Payer: Self-pay | Admitting: Obstetrics & Gynecology

## 2021-11-01 ENCOUNTER — Other Ambulatory Visit: Payer: Self-pay | Admitting: Obstetrics & Gynecology

## 2021-11-01 MED ORDER — TERCONAZOLE 0.4 % VA CREA: 1.0000 | TOPICAL_CREAM | Freq: Every day | VAGINAL | 1 refills | Status: AC

## 2021-11-01 MED ORDER — METFORMIN HCL 1000 MG PO TABS: 1000.0000 mg | ORAL_TABLET | Freq: Two times a day (BID) | ORAL | 11 refills | Status: AC

## 2021-11-06 DIAGNOSIS — Z1231 Encounter for screening mammogram for malignant neoplasm of breast: Secondary | ICD-10-CM | POA: Diagnosis not present

## 2021-11-14 ENCOUNTER — Other Ambulatory Visit: Payer: Self-pay | Admitting: Obstetrics & Gynecology

## 2021-11-15 ENCOUNTER — Other Ambulatory Visit: Payer: Self-pay | Admitting: Obstetrics & Gynecology

## 2021-11-15 ENCOUNTER — Encounter: Payer: Self-pay | Admitting: Obstetrics & Gynecology

## 2021-11-22 ENCOUNTER — Other Ambulatory Visit: Payer: Self-pay | Admitting: *Deleted

## 2021-11-23 MED ORDER — PAROXETINE HCL 20 MG PO TABS: 20.0000 mg | ORAL_TABLET | Freq: Every day | ORAL | 11 refills | Status: DC

## 2021-11-24 DIAGNOSIS — R928 Other abnormal and inconclusive findings on diagnostic imaging of breast: Secondary | ICD-10-CM | POA: Diagnosis not present

## 2021-11-24 DIAGNOSIS — R922 Inconclusive mammogram: Secondary | ICD-10-CM | POA: Diagnosis not present

## 2021-12-07 DIAGNOSIS — H5213 Myopia, bilateral: Secondary | ICD-10-CM | POA: Diagnosis not present

## 2022-01-04 ENCOUNTER — Other Ambulatory Visit: Payer: Medicaid Other | Admitting: Obstetrics & Gynecology

## 2022-01-04 DIAGNOSIS — L814 Other melanin hyperpigmentation: Secondary | ICD-10-CM | POA: Diagnosis not present

## 2022-01-04 DIAGNOSIS — D2271 Melanocytic nevi of right lower limb, including hip: Secondary | ICD-10-CM | POA: Diagnosis not present

## 2022-01-04 DIAGNOSIS — D2261 Melanocytic nevi of right upper limb, including shoulder: Secondary | ICD-10-CM | POA: Diagnosis not present

## 2022-01-04 DIAGNOSIS — R208 Other disturbances of skin sensation: Secondary | ICD-10-CM | POA: Diagnosis not present

## 2022-01-04 DIAGNOSIS — D225 Melanocytic nevi of trunk: Secondary | ICD-10-CM | POA: Diagnosis not present

## 2022-02-09 DIAGNOSIS — R41840 Attention and concentration deficit: Secondary | ICD-10-CM | POA: Diagnosis not present

## 2022-02-09 DIAGNOSIS — F39 Unspecified mood [affective] disorder: Secondary | ICD-10-CM | POA: Diagnosis not present

## 2022-02-09 DIAGNOSIS — E119 Type 2 diabetes mellitus without complications: Secondary | ICD-10-CM | POA: Diagnosis not present

## 2022-02-09 DIAGNOSIS — E039 Hypothyroidism, unspecified: Secondary | ICD-10-CM | POA: Diagnosis not present

## 2022-02-09 DIAGNOSIS — Z7984 Long term (current) use of oral hypoglycemic drugs: Secondary | ICD-10-CM | POA: Diagnosis not present

## 2022-02-14 DIAGNOSIS — Z202 Contact with and (suspected) exposure to infections with a predominantly sexual mode of transmission: Secondary | ICD-10-CM | POA: Diagnosis not present

## 2022-02-14 DIAGNOSIS — Z8619 Personal history of other infectious and parasitic diseases: Secondary | ICD-10-CM | POA: Diagnosis not present

## 2022-02-14 DIAGNOSIS — B3741 Candidal cystitis and urethritis: Secondary | ICD-10-CM | POA: Diagnosis not present

## 2022-03-15 DIAGNOSIS — E119 Type 2 diabetes mellitus without complications: Secondary | ICD-10-CM | POA: Diagnosis not present

## 2022-03-15 DIAGNOSIS — F39 Unspecified mood [affective] disorder: Secondary | ICD-10-CM | POA: Diagnosis not present

## 2022-03-15 DIAGNOSIS — R41840 Attention and concentration deficit: Secondary | ICD-10-CM | POA: Diagnosis not present

## 2022-03-15 DIAGNOSIS — E039 Hypothyroidism, unspecified: Secondary | ICD-10-CM | POA: Diagnosis not present

## 2022-03-15 DIAGNOSIS — A609 Anogenital herpesviral infection, unspecified: Secondary | ICD-10-CM | POA: Diagnosis not present

## 2022-05-03 DIAGNOSIS — R928 Other abnormal and inconclusive findings on diagnostic imaging of breast: Secondary | ICD-10-CM | POA: Diagnosis not present

## 2022-05-20 DIAGNOSIS — R399 Unspecified symptoms and signs involving the genitourinary system: Secondary | ICD-10-CM | POA: Diagnosis not present

## 2022-06-06 DIAGNOSIS — R350 Frequency of micturition: Secondary | ICD-10-CM | POA: Diagnosis not present

## 2022-06-06 DIAGNOSIS — Z7251 High risk heterosexual behavior: Secondary | ICD-10-CM | POA: Diagnosis not present

## 2022-06-07 DIAGNOSIS — R109 Unspecified abdominal pain: Secondary | ICD-10-CM | POA: Diagnosis not present

## 2022-06-07 DIAGNOSIS — R079 Chest pain, unspecified: Secondary | ICD-10-CM | POA: Diagnosis not present

## 2022-06-07 DIAGNOSIS — Z5321 Procedure and treatment not carried out due to patient leaving prior to being seen by health care provider: Secondary | ICD-10-CM | POA: Diagnosis not present

## 2022-06-07 DIAGNOSIS — R110 Nausea: Secondary | ICD-10-CM | POA: Diagnosis not present

## 2022-06-07 DIAGNOSIS — R1110 Vomiting, unspecified: Secondary | ICD-10-CM | POA: Diagnosis not present

## 2022-07-04 DIAGNOSIS — Z7251 High risk heterosexual behavior: Secondary | ICD-10-CM | POA: Diagnosis not present

## 2022-07-04 DIAGNOSIS — R300 Dysuria: Secondary | ICD-10-CM | POA: Diagnosis not present

## 2022-07-04 DIAGNOSIS — N390 Urinary tract infection, site not specified: Secondary | ICD-10-CM | POA: Diagnosis not present

## 2022-07-04 DIAGNOSIS — B3731 Acute candidiasis of vulva and vagina: Secondary | ICD-10-CM | POA: Diagnosis not present

## 2022-10-04 DIAGNOSIS — R059 Cough, unspecified: Secondary | ICD-10-CM | POA: Diagnosis not present

## 2022-10-04 DIAGNOSIS — Z20822 Contact with and (suspected) exposure to covid-19: Secondary | ICD-10-CM | POA: Diagnosis not present

## 2022-10-04 DIAGNOSIS — J029 Acute pharyngitis, unspecified: Secondary | ICD-10-CM | POA: Diagnosis not present

## 2022-10-15 DIAGNOSIS — R052 Subacute cough: Secondary | ICD-10-CM | POA: Diagnosis not present

## 2022-10-15 DIAGNOSIS — Z0000 Encounter for general adult medical examination without abnormal findings: Secondary | ICD-10-CM | POA: Diagnosis not present

## 2022-11-23 DIAGNOSIS — U071 COVID-19: Secondary | ICD-10-CM | POA: Diagnosis not present

## 2023-06-13 DIAGNOSIS — E119 Type 2 diabetes mellitus without complications: Secondary | ICD-10-CM | POA: Diagnosis not present

## 2023-06-13 DIAGNOSIS — G629 Polyneuropathy, unspecified: Secondary | ICD-10-CM | POA: Diagnosis not present

## 2023-06-13 DIAGNOSIS — E1140 Type 2 diabetes mellitus with diabetic neuropathy, unspecified: Secondary | ICD-10-CM | POA: Diagnosis not present

## 2023-10-09 DIAGNOSIS — N760 Acute vaginitis: Secondary | ICD-10-CM | POA: Diagnosis not present

## 2023-11-06 ENCOUNTER — Ambulatory Visit
Admission: EM | Admit: 2023-11-06 | Discharge: 2023-11-06 | Disposition: A | Discharge status: Home / Self Care | Payer: Medicaid Other

## 2023-11-06 DIAGNOSIS — L03113 Cellulitis of right upper limb: Secondary | ICD-10-CM | POA: Diagnosis not present

## 2023-11-06 MED ORDER — DOXYCYCLINE HYCLATE 100 MG PO CAPS: 100.0000 mg | ORAL_CAPSULE | Freq: Two times a day (BID) | ORAL | 0 refills | Status: AC

## 2023-11-06 NOTE — ED Provider Notes (Signed)
[facility]    CSN: [id number] Arrival date & time: 11/06/23  1458      History   Chief Complaint Chief Complaint  Patient presents with   Cellulitis    RT hand    HPI Miranda [name] is a 43 y.o. female.   HPI 43 year old female presents with possible cellulitis of right hand  Past Medical History:  Diagnosis Date   Chest pain 07/24/2012   2D Echo EF 60%-65% which shoewd a bicuspid aortic vavle with mild stenosis   Diabetes mellitus    type 2 on insulin   Heart valve problem    Herpes    Hypothyroidism    Irregular periods 04/14/2014   Mental disorder    depression, postpartum depression   Palpitation    PVC (premature ventricular contraction) 07/24/2012   stress test which was normal with good excercise    Patient Active Problem List   Diagnosis Date Noted   Carpal tunnel syndrome, left upper limb 05/03/2021   Cesarean delivery delivered 11/19/2018   UTI (urinary tract infection) during pregnancy, first trimester 04/28/2018   Smoker 10/21/2013   Depression 10/21/2013   Palpitations 03/03/2013   Type II diabetes mellitus (HCC) 02/04/2013   Hypothyroidism 01/19/2013    Past Surgical History:  Procedure Laterality Date   CESAREAN SECTION WITH BILATERAL TUBAL LIGATION N/A 11/19/2018   Procedure: CESAREAN SECTION WITH BILATERAL TUBAL LIGATION;  Surgeon: Lazaro Arms, MD;  Location: [facility];  Service: Obstetrics;  Laterality: N/A;   INCISION AND DRAINAGE OF PERITONSILLAR ABCESS N/A 11/19/2018   Procedure: EXCISION OF ABDOMINAL WALL ABCESS;  Surgeon: Lazaro Arms, MD;  Location: [facility];  Service: Obstetrics;  Laterality: N/A;   WISDOM TOOTH EXTRACTION      OB History     Gravida  2   Para  2   Term  2   Preterm      AB      Living  2      SAB      IAB      Ectopic      Multiple  0   Live Births  2            Home Medications    Prior to Admission medications   Medication Sig Start Date End Date  Taking? Authorizing Provider  doxycycline (VIBRAMYCIN) 100 MG capsule Take 1 capsule (100 mg total) by mouth 2 (two) times daily for 10 days. 11/06/23 11/16/23 Yes Trevor Iha, FNP  gabapentin (NEURONTIN) 300 MG capsule Take by mouth. 10/21/23  Yes [provider]  Semaglutide,0.25 or 0.5MG /DOS, (OZEMPIC, 0.25 OR 0.5 MG/DOSE,) 2 MG/3ML SOPN Inject into the skin. 04/12/22  Yes [provider]  buPROPion (WELLBUTRIN XL) 300 MG 24 hr tablet Take 300 mg by mouth daily.    [provider]  levothyroxine (SYNTHROID) 150 MCG tablet TAKE 1 TABLET(150 MCG) BY MOUTH DAILY BEFORE BREAKFAST 02/27/21   Lazaro Arms, MD  medroxyPROGESTERone (PROVERA) 10 MG tablet TAKE 1 TABLET(10 MG) BY MOUTH DAILY Patient not taking: Reported on 09/11/2021 01/05/21   Lazaro Arms, MD  metFORMIN (GLUCOPHAGE) 1000 MG tablet Take 1 tablet (1,000 mg total) by mouth 2 (two) times daily with a meal. 11/01/21   Lazaro Arms, MD  sertraline (ZOLOFT) 100 MG tablet TAKE 1 TABLET(100 MG) BY MOUTH DAILY Patient not taking: Reported on 09/11/2021 08/16/20   Janace Aris, FNP  terconazole (TERAZOL 7) 0.4 % vaginal cream Place  1 applicator vaginally at bedtime. 11/01/21   Lazaro Arms, MD    Family History Family History  Problem Relation Age of Onset   Hypertension Mother    Diabetes Brother    Diabetes Maternal Grandmother    Congestive Heart Failure Maternal Grandmother    Hearing loss Daughter     Social History Social History   Tobacco Use   Smoking status: Every Day    Current packs/day: 0.00    Average packs/day: 0.3 packs/day for 5.0 years (1.3 ttl pk-yrs)    Types: Cigarettes    Start date: 05/21/2007    Last attempt to quit: 05/20/2012    Years since quitting: 11.4   Smokeless tobacco: Never   Tobacco comments:    5 cigs per day  Vaping Use   Vaping status: Never Used  Substance Use Topics   Alcohol use: No    Alcohol/week: 0.0 standard drinks of alcohol   Drug use: No      Allergies   Keflex [cephalexin]   Review of Systems Review of Systems  Skin:  Positive for rash.     Physical Exam Triage Vital Signs ED Triage Vitals  Encounter Vitals Group     BP      Systolic BP Percentile      Diastolic BP Percentile      Pulse      Resp      Temp      Temp src      SpO2      Weight      Height      Head Circumference      Peak Flow      Pain Score      Pain Loc      Pain Education      Exclude from Growth Chart    No data found.  Updated Vital Signs BP (!) 148/89 (BP Location: Left Arm)   Pulse 97   Temp 98.2 F (36.8 C) (Oral)   Resp 17   LMP  (LMP Unknown)   SpO2 98%    Physical Exam Vitals and nursing note reviewed.  Constitutional:      Appearance: Normal appearance. She is normal weight.  HENT:     Head: Normocephalic and atraumatic.     Mouth/Throat:     Mouth: Mucous membranes are moist.     Pharynx: Oropharynx is clear.  Eyes:     Extraocular Movements: Extraocular movements intact.     Conjunctiva/sclera: Conjunctivae normal.     Pupils: Pupils are equal, round, and reactive to light.  Cardiovascular:     Rate and Rhythm: Normal rate and regular rhythm.     Pulses: Normal pulses.     Heart sounds: Normal heart sounds.  Pulmonary:     Effort: Pulmonary effort is normal.     Breath sounds: Normal breath sounds. No wheezing, rhonchi or rales.  Musculoskeletal:        General: Normal range of motion.     Cervical back: Normal range of motion and neck supple.  Skin:    General: Skin is warm and dry.     Comments: Dorsum of the right hand: Erythematous maculopapular cyst-please see image below  Neurological:     General: No focal deficit present.     Mental Status: She is alert and oriented to person, place, and time. Mental status is at baseline.  Psychiatric:        Mood and Affect: Mood normal.  Behavior: Behavior normal.      UC Treatments / Results  Labs (all labs ordered are listed, but only  abnormal results are displayed) Labs Reviewed - No data to display  EKG   Radiology No results found.  Procedures Procedures (including critical care time)  Medications Ordered in UC Medications - No data to display  Initial Impression / Assessment and Plan / UC Course  I have reviewed the triage vital signs and the nursing notes.  Pertinent labs & imaging results that were available during my care of the patient were reviewed by me and considered in my medical decision making (see chart for details).     MDM: 1.  Cellulitis of right hand-Rx'd Doxycycline 100 mg capsule: Take 1 capsule twice daily x 10 days. Advised patient to take medication as directed with food to completion.  Encouraged to increase daily water intake to 64 ounces per day while taking this medication.  Advised patient to leave affected area open to air and uncovered to allow secondary healing/scab formation.  Advised if symptoms worsen and/or unresolved please follow-up PCP or here for further evaluation.  Patient discharged home, hemodynamically stable. Final Clinical Impressions(s) / UC Diagnoses   Final diagnoses:  Cellulitis of right hand     Discharge Instructions      Advised patient to take medication as directed with food to completion.  Encouraged to increase daily water intake to 64 ounces per day while taking this medication.  Advised patient to leave affected area open to air and uncovered to allow secondary healing/scab formation.  Advised if symptoms worsen and/or unresolved please follow-up PCP or here for further evaluation.     ED Prescriptions     Medication Sig Dispense Auth. Provider   doxycycline (VIBRAMYCIN) 100 MG capsule Take 1 capsule (100 mg total) by mouth 2 (two) times daily for 10 days. 20 capsule Trevor Iha, FNP      PDMP not reviewed this encounter.   Trevor Iha, FNP 11/06/23 1539

## 2023-11-06 NOTE — ED Triage Notes (Signed)
Pt c/o infection to RT hand x 3 days.

## 2023-11-06 NOTE — Discharge Instructions (Addendum)
Advised patient to take medication as directed with food to completion.  Encouraged to increase daily water intake to 64 ounces per day while taking this medication.  Advised patient to leave affected area open to air and uncovered to allow secondary healing/scab formation.  Advised if symptoms worsen and/or unresolved please follow-up PCP or here for further evaluation.

## 2024-01-23 DIAGNOSIS — Z7251 High risk heterosexual behavior: Secondary | ICD-10-CM | POA: Diagnosis not present

## 2024-01-23 DIAGNOSIS — R1030 Lower abdominal pain, unspecified: Secondary | ICD-10-CM | POA: Diagnosis not present

## 2024-01-29 DIAGNOSIS — E079 Disorder of thyroid, unspecified: Secondary | ICD-10-CM | POA: Diagnosis not present

## 2024-01-29 DIAGNOSIS — F419 Anxiety disorder, unspecified: Secondary | ICD-10-CM | POA: Diagnosis not present

## 2024-01-29 DIAGNOSIS — E119 Type 2 diabetes mellitus without complications: Secondary | ICD-10-CM | POA: Diagnosis not present

## 2024-01-29 DIAGNOSIS — Z7984 Long term (current) use of oral hypoglycemic drugs: Secondary | ICD-10-CM | POA: Diagnosis not present

## 2024-01-29 DIAGNOSIS — Z7982 Long term (current) use of aspirin: Secondary | ICD-10-CM | POA: Diagnosis not present

## 2024-01-29 DIAGNOSIS — R079 Chest pain, unspecified: Secondary | ICD-10-CM | POA: Diagnosis not present

## 2024-01-29 DIAGNOSIS — Z79890 Hormone replacement therapy (postmenopausal): Secondary | ICD-10-CM | POA: Diagnosis not present

## 2024-01-29 DIAGNOSIS — Z79899 Other long term (current) drug therapy: Secondary | ICD-10-CM | POA: Diagnosis not present

## 2024-01-29 DIAGNOSIS — F32A Depression, unspecified: Secondary | ICD-10-CM | POA: Diagnosis not present

## 2024-01-29 DIAGNOSIS — Z881 Allergy status to other antibiotic agents status: Secondary | ICD-10-CM | POA: Diagnosis not present

## 2024-01-29 DIAGNOSIS — M722 Plantar fascial fibromatosis: Secondary | ICD-10-CM | POA: Diagnosis not present

## 2024-01-29 DIAGNOSIS — F17210 Nicotine dependence, cigarettes, uncomplicated: Secondary | ICD-10-CM | POA: Diagnosis not present

## 2024-02-18 DIAGNOSIS — Z3202 Encounter for pregnancy test, result negative: Secondary | ICD-10-CM | POA: Diagnosis not present

## 2024-02-18 DIAGNOSIS — R1030 Lower abdominal pain, unspecified: Secondary | ICD-10-CM | POA: Diagnosis not present

## 2024-07-23 DIAGNOSIS — Z0000 Encounter for general adult medical examination without abnormal findings: Secondary | ICD-10-CM | POA: Diagnosis not present

## 2024-08-23 DIAGNOSIS — U071 COVID-19: Secondary | ICD-10-CM | POA: Diagnosis not present

## 2024-08-23 DIAGNOSIS — R059 Cough, unspecified: Secondary | ICD-10-CM | POA: Diagnosis not present
